# Patient Record
Sex: Female | Born: 1950 | Race: White | Hispanic: No | Marital: Married | Smoking: Never smoker
Health system: Southern US, Community
[De-identification: ages and names within clinical notes are randomized; demographics above are authoritative.]

## PROBLEM LIST (undated history)

## (undated) DIAGNOSIS — F329 Major depressive disorder, single episode, unspecified: Secondary | ICD-10-CM

## (undated) DIAGNOSIS — I4891 Unspecified atrial fibrillation: Secondary | ICD-10-CM

## (undated) DIAGNOSIS — R319 Hematuria, unspecified: Secondary | ICD-10-CM

## (undated) DIAGNOSIS — I5032 Chronic diastolic (congestive) heart failure: Secondary | ICD-10-CM

## (undated) DIAGNOSIS — Z953 Presence of xenogenic heart valve: Secondary | ICD-10-CM

## (undated) DIAGNOSIS — J189 Pneumonia, unspecified organism: Secondary | ICD-10-CM

## (undated) DIAGNOSIS — I050 Rheumatic mitral stenosis: Secondary | ICD-10-CM

## (undated) DIAGNOSIS — T4145XA Adverse effect of unspecified anesthetic, initial encounter: Secondary | ICD-10-CM

## (undated) DIAGNOSIS — R112 Nausea with vomiting, unspecified: Secondary | ICD-10-CM

## (undated) DIAGNOSIS — I4811 Longstanding persistent atrial fibrillation: Secondary | ICD-10-CM

## (undated) DIAGNOSIS — I499 Cardiac arrhythmia, unspecified: Secondary | ICD-10-CM

## (undated) DIAGNOSIS — E041 Nontoxic single thyroid nodule: Secondary | ICD-10-CM

## (undated) DIAGNOSIS — Z923 Personal history of irradiation: Secondary | ICD-10-CM

## (undated) DIAGNOSIS — E039 Hypothyroidism, unspecified: Secondary | ICD-10-CM

## (undated) DIAGNOSIS — I080 Rheumatic disorders of both mitral and aortic valves: Secondary | ICD-10-CM

## (undated) DIAGNOSIS — T8859XA Other complications of anesthesia, initial encounter: Secondary | ICD-10-CM

## (undated) DIAGNOSIS — Z8679 Personal history of other diseases of the circulatory system: Secondary | ICD-10-CM

## (undated) DIAGNOSIS — Z9889 Other specified postprocedural states: Secondary | ICD-10-CM

## (undated) DIAGNOSIS — I099 Rheumatic heart disease, unspecified: Secondary | ICD-10-CM

## (undated) DIAGNOSIS — F32A Depression, unspecified: Secondary | ICD-10-CM

## (undated) DIAGNOSIS — S62109A Fracture of unspecified carpal bone, unspecified wrist, initial encounter for closed fracture: Secondary | ICD-10-CM

## (undated) HISTORY — DX: Nontoxic single thyroid nodule: E04.1

## (undated) HISTORY — PX: FRACTURE SURGERY: SHX138

## (undated) HISTORY — DX: Rheumatic heart disease, unspecified: I09.9

## (undated) HISTORY — PX: TUBAL LIGATION: SHX77

## (undated) HISTORY — PX: CARDIOVERSION: SHX1299

## (undated) HISTORY — DX: Rheumatic mitral stenosis: I05.0

## (undated) HISTORY — PX: COLONOSCOPY: SHX174

## (undated) HISTORY — DX: Hematuria, unspecified: R31.9

## (undated) HISTORY — PX: CATARACT EXTRACTION: SUR2

## (undated) HISTORY — DX: Rheumatic disorders of both mitral and aortic valves: I08.0

## (undated) HISTORY — DX: Unspecified atrial fibrillation: I48.91

---
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
## 1987-05-08 HISTORY — PX: OTHER SURGICAL HISTORY: SHX169

## 1998-10-06 HISTORY — PX: PERCUTANEOUS BALLOON VALVULOPLASTY: SHX270

## 1998-10-07 ENCOUNTER — Ambulatory Visit (HOSPITAL_COMMUNITY): Admission: RE | Admit: 1998-10-07 | Discharge: 1998-10-07 | Payer: Self-pay

## 1998-10-17 ENCOUNTER — Inpatient Hospital Stay (HOSPITAL_COMMUNITY): Admission: AD | Admit: 1998-10-17 | Discharge: 1998-10-20 | Payer: Self-pay | Admitting: Cardiology

## 1998-10-20 ENCOUNTER — Encounter: Payer: Self-pay | Admitting: Cardiovascular Disease

## 1998-10-25 ENCOUNTER — Inpatient Hospital Stay (HOSPITAL_COMMUNITY): Admission: EM | Admit: 1998-10-25 | Discharge: 1998-10-27 | Payer: Self-pay | Admitting: Emergency Medicine

## 1999-03-23 ENCOUNTER — Ambulatory Visit (HOSPITAL_COMMUNITY): Admission: RE | Admit: 1999-03-23 | Discharge: 1999-03-23 | Payer: Self-pay | Admitting: Obstetrics and Gynecology

## 1999-03-23 ENCOUNTER — Encounter: Payer: Self-pay | Admitting: Obstetrics and Gynecology

## 1999-03-27 ENCOUNTER — Other Ambulatory Visit: Admission: RE | Admit: 1999-03-27 | Discharge: 1999-03-27 | Payer: Self-pay | Admitting: Obstetrics and Gynecology

## 2000-07-17 ENCOUNTER — Other Ambulatory Visit: Admission: RE | Admit: 2000-07-17 | Discharge: 2000-07-17 | Payer: Self-pay | Admitting: Obstetrics and Gynecology

## 2000-07-24 ENCOUNTER — Encounter: Payer: Self-pay | Admitting: Obstetrics and Gynecology

## 2000-07-24 ENCOUNTER — Ambulatory Visit (HOSPITAL_COMMUNITY): Admission: RE | Admit: 2000-07-24 | Discharge: 2000-07-24 | Payer: Self-pay | Admitting: Obstetrics and Gynecology

## 2000-07-26 ENCOUNTER — Encounter: Payer: Self-pay | Admitting: Obstetrics and Gynecology

## 2000-07-26 ENCOUNTER — Encounter: Admission: RE | Admit: 2000-07-26 | Discharge: 2000-07-26 | Payer: Self-pay | Admitting: Obstetrics and Gynecology

## 2002-01-12 ENCOUNTER — Ambulatory Visit (HOSPITAL_COMMUNITY): Admission: RE | Admit: 2002-01-12 | Discharge: 2002-01-12 | Payer: Self-pay | Admitting: Obstetrics and Gynecology

## 2002-01-12 ENCOUNTER — Encounter: Payer: Self-pay | Admitting: Obstetrics and Gynecology

## 2002-02-03 ENCOUNTER — Other Ambulatory Visit: Admission: RE | Admit: 2002-02-03 | Discharge: 2002-02-03 | Payer: Self-pay | Admitting: Obstetrics and Gynecology

## 2003-01-14 ENCOUNTER — Encounter: Payer: Self-pay | Admitting: Obstetrics and Gynecology

## 2003-01-14 ENCOUNTER — Ambulatory Visit (HOSPITAL_COMMUNITY): Admission: RE | Admit: 2003-01-14 | Discharge: 2003-01-14 | Payer: Self-pay | Admitting: Obstetrics and Gynecology

## 2003-03-04 ENCOUNTER — Other Ambulatory Visit: Admission: RE | Admit: 2003-03-04 | Discharge: 2003-03-04 | Payer: Self-pay | Admitting: Obstetrics and Gynecology

## 2004-03-14 ENCOUNTER — Ambulatory Visit: Payer: Self-pay | Admitting: Cardiology

## 2004-04-13 ENCOUNTER — Ambulatory Visit: Payer: Self-pay | Admitting: Cardiology

## 2004-05-17 ENCOUNTER — Ambulatory Visit: Payer: Self-pay

## 2004-06-07 ENCOUNTER — Ambulatory Visit: Payer: Self-pay | Admitting: Cardiology

## 2004-07-05 ENCOUNTER — Ambulatory Visit: Payer: Self-pay | Admitting: Cardiology

## 2004-07-28 ENCOUNTER — Other Ambulatory Visit: Admission: RE | Admit: 2004-07-28 | Discharge: 2004-07-28 | Payer: Self-pay | Admitting: Obstetrics and Gynecology

## 2004-08-02 ENCOUNTER — Ambulatory Visit: Payer: Self-pay | Admitting: *Deleted

## 2004-08-15 ENCOUNTER — Encounter: Admission: RE | Admit: 2004-08-15 | Discharge: 2004-08-15 | Payer: Self-pay | Admitting: Obstetrics and Gynecology

## 2004-08-30 ENCOUNTER — Ambulatory Visit: Payer: Self-pay | Admitting: Cardiology

## 2004-09-13 ENCOUNTER — Ambulatory Visit: Payer: Self-pay | Admitting: Cardiology

## 2004-10-11 ENCOUNTER — Ambulatory Visit: Payer: Self-pay | Admitting: Cardiology

## 2004-11-08 ENCOUNTER — Ambulatory Visit: Payer: Self-pay | Admitting: Internal Medicine

## 2004-11-28 ENCOUNTER — Ambulatory Visit: Payer: Self-pay | Admitting: Cardiology

## 2004-12-26 ENCOUNTER — Ambulatory Visit: Payer: Self-pay | Admitting: Cardiology

## 2005-01-10 ENCOUNTER — Ambulatory Visit: Payer: Self-pay | Admitting: Cardiology

## 2005-01-25 ENCOUNTER — Ambulatory Visit: Payer: Self-pay | Admitting: Cardiology

## 2005-02-08 ENCOUNTER — Ambulatory Visit: Payer: Self-pay

## 2005-02-20 ENCOUNTER — Ambulatory Visit: Payer: Self-pay | Admitting: Cardiology

## 2005-03-27 ENCOUNTER — Ambulatory Visit: Payer: Self-pay | Admitting: Cardiology

## 2005-05-08 ENCOUNTER — Ambulatory Visit: Payer: Self-pay | Admitting: *Deleted

## 2005-06-07 ENCOUNTER — Ambulatory Visit: Payer: Self-pay | Admitting: Cardiology

## 2005-07-05 ENCOUNTER — Ambulatory Visit: Payer: Self-pay | Admitting: Cardiology

## 2005-08-07 ENCOUNTER — Ambulatory Visit: Payer: Self-pay | Admitting: Cardiology

## 2005-09-04 ENCOUNTER — Ambulatory Visit: Payer: Self-pay | Admitting: Cardiology

## 2005-09-18 ENCOUNTER — Encounter: Admission: RE | Admit: 2005-09-18 | Discharge: 2005-09-18 | Payer: Self-pay | Admitting: Obstetrics and Gynecology

## 2005-09-19 ENCOUNTER — Other Ambulatory Visit: Admission: RE | Admit: 2005-09-19 | Discharge: 2005-09-19 | Payer: Self-pay | Admitting: Obstetrics & Gynecology

## 2005-10-02 ENCOUNTER — Ambulatory Visit: Payer: Self-pay | Admitting: Internal Medicine

## 2005-10-31 ENCOUNTER — Ambulatory Visit: Payer: Self-pay | Admitting: Cardiology

## 2005-11-28 ENCOUNTER — Ambulatory Visit: Payer: Self-pay | Admitting: Cardiology

## 2005-12-26 ENCOUNTER — Ambulatory Visit: Payer: Self-pay | Admitting: Cardiology

## 2006-01-28 ENCOUNTER — Ambulatory Visit: Payer: Self-pay | Admitting: Cardiology

## 2006-02-25 ENCOUNTER — Ambulatory Visit: Payer: Self-pay | Admitting: Cardiovascular Disease

## 2006-04-02 ENCOUNTER — Ambulatory Visit: Payer: Self-pay | Admitting: Cardiology

## 2006-05-08 ENCOUNTER — Ambulatory Visit: Payer: Self-pay | Admitting: *Deleted

## 2006-05-16 ENCOUNTER — Ambulatory Visit: Payer: Self-pay | Admitting: Cardiology

## 2006-05-16 LAB — CONVERTED CEMR LAB
MCHC: 33.8 g/dL (ref 30.0–36.0)
MCV: 90.6 fL (ref 78.0–100.0)
Platelets: 229 10*3/uL (ref 150–400)
RBC: 4.44 M/uL (ref 3.87–5.11)
RDW: 13.1 % (ref 11.5–14.6)

## 2006-05-28 ENCOUNTER — Ambulatory Visit: Payer: Self-pay

## 2006-05-28 ENCOUNTER — Ambulatory Visit: Payer: Self-pay | Admitting: *Deleted

## 2006-05-28 ENCOUNTER — Encounter: Payer: Self-pay | Admitting: Cardiology

## 2006-06-11 ENCOUNTER — Ambulatory Visit: Payer: Self-pay | Admitting: Cardiology

## 2006-07-09 ENCOUNTER — Ambulatory Visit: Payer: Self-pay | Admitting: Cardiology

## 2006-08-06 ENCOUNTER — Ambulatory Visit: Payer: Self-pay | Admitting: *Deleted

## 2006-08-20 ENCOUNTER — Ambulatory Visit: Payer: Self-pay | Admitting: *Deleted

## 2006-09-17 ENCOUNTER — Ambulatory Visit: Payer: Self-pay | Admitting: Cardiology

## 2006-10-15 ENCOUNTER — Ambulatory Visit: Payer: Self-pay | Admitting: Cardiology

## 2006-11-12 ENCOUNTER — Ambulatory Visit: Payer: Self-pay | Admitting: Cardiology

## 2006-11-22 ENCOUNTER — Encounter: Admission: RE | Admit: 2006-11-22 | Discharge: 2006-11-22 | Payer: Self-pay | Admitting: Obstetrics and Gynecology

## 2006-11-29 ENCOUNTER — Other Ambulatory Visit: Admission: RE | Admit: 2006-11-29 | Discharge: 2006-11-29 | Payer: Self-pay | Admitting: Obstetrics and Gynecology

## 2006-12-03 ENCOUNTER — Ambulatory Visit: Payer: Self-pay | Admitting: Cardiology

## 2006-12-31 ENCOUNTER — Ambulatory Visit: Payer: Self-pay | Admitting: Cardiology

## 2007-01-28 ENCOUNTER — Ambulatory Visit: Payer: Self-pay | Admitting: Cardiology

## 2007-02-25 ENCOUNTER — Ambulatory Visit: Payer: Self-pay | Admitting: Cardiovascular Disease

## 2007-03-25 ENCOUNTER — Ambulatory Visit: Payer: Self-pay | Admitting: Cardiology

## 2007-04-23 ENCOUNTER — Ambulatory Visit: Payer: Self-pay | Admitting: Cardiovascular Disease

## 2007-05-14 ENCOUNTER — Ambulatory Visit: Payer: Self-pay | Admitting: Cardiovascular Disease

## 2007-06-11 ENCOUNTER — Ambulatory Visit: Payer: Self-pay | Admitting: Internal Medicine

## 2007-06-26 ENCOUNTER — Ambulatory Visit: Payer: Self-pay | Admitting: Cardiology

## 2007-06-26 LAB — CONVERTED CEMR LAB
Basophils Relative: 0.9 % (ref 0.0–1.0)
Monocytes Relative: 10.1 % (ref 3.0–11.0)
Neutro Abs: 5.4 10*3/uL (ref 1.4–7.7)
Platelets: 242 10*3/uL (ref 150–400)
RBC: 4.39 M/uL (ref 3.87–5.11)
RDW: 13 % (ref 11.5–14.6)
WBC: 8.4 10*3/uL (ref 4.5–10.5)

## 2007-07-15 ENCOUNTER — Ambulatory Visit: Payer: Self-pay | Admitting: Cardiology

## 2007-07-15 ENCOUNTER — Ambulatory Visit: Payer: Self-pay

## 2007-07-15 ENCOUNTER — Encounter: Payer: Self-pay | Admitting: Cardiology

## 2007-08-05 ENCOUNTER — Ambulatory Visit: Payer: Self-pay | Admitting: Internal Medicine

## 2007-08-26 ENCOUNTER — Ambulatory Visit: Payer: Self-pay | Admitting: Cardiology

## 2007-09-23 ENCOUNTER — Ambulatory Visit: Payer: Self-pay | Admitting: Cardiology

## 2007-10-21 ENCOUNTER — Ambulatory Visit: Payer: Self-pay | Admitting: Cardiology

## 2007-11-24 ENCOUNTER — Encounter: Admission: RE | Admit: 2007-11-24 | Discharge: 2007-11-24 | Payer: Self-pay | Admitting: Obstetrics and Gynecology

## 2007-11-24 ENCOUNTER — Ambulatory Visit: Payer: Self-pay | Admitting: Cardiology

## 2007-12-02 ENCOUNTER — Encounter: Admission: RE | Admit: 2007-12-02 | Discharge: 2007-12-02 | Payer: Self-pay | Admitting: Obstetrics and Gynecology

## 2007-12-11 ENCOUNTER — Ambulatory Visit: Payer: Self-pay | Admitting: Internal Medicine

## 2007-12-16 ENCOUNTER — Other Ambulatory Visit: Admission: RE | Admit: 2007-12-16 | Discharge: 2007-12-16 | Payer: Self-pay | Admitting: Obstetrics and Gynecology

## 2008-01-08 ENCOUNTER — Ambulatory Visit: Payer: Self-pay | Admitting: Cardiology

## 2008-01-20 ENCOUNTER — Ambulatory Visit: Payer: Self-pay | Admitting: Cardiology

## 2008-02-10 ENCOUNTER — Ambulatory Visit: Payer: Self-pay | Admitting: Cardiovascular Disease

## 2008-03-09 ENCOUNTER — Ambulatory Visit: Payer: Self-pay | Admitting: Internal Medicine

## 2008-03-30 ENCOUNTER — Ambulatory Visit: Payer: Self-pay | Admitting: Cardiology

## 2008-04-20 ENCOUNTER — Ambulatory Visit: Payer: Self-pay | Admitting: Internal Medicine

## 2008-05-18 ENCOUNTER — Ambulatory Visit: Payer: Self-pay | Admitting: Cardiovascular Disease

## 2008-06-22 ENCOUNTER — Ambulatory Visit: Payer: Self-pay | Admitting: Cardiology

## 2008-07-02 ENCOUNTER — Ambulatory Visit: Payer: Self-pay | Admitting: Cardiology

## 2008-07-09 ENCOUNTER — Ambulatory Visit: Payer: Self-pay

## 2008-07-09 ENCOUNTER — Encounter: Payer: Self-pay | Admitting: Cardiology

## 2008-07-09 ENCOUNTER — Ambulatory Visit: Payer: Self-pay | Admitting: Cardiology

## 2008-07-09 LAB — CONVERTED CEMR LAB
Basophils Relative: 0.3 % (ref 0.0–3.0)
Eosinophils Absolute: 0.1 10*3/uL (ref 0.0–0.7)
Eosinophils Relative: 1.4 % (ref 0.0–5.0)
HCT: 40.9 % (ref 36.0–46.0)
Hemoglobin: 13.8 g/dL (ref 12.0–15.0)
MCV: 91.9 fL (ref 78.0–100.0)
Neutro Abs: 4.2 10*3/uL (ref 1.4–7.7)
Neutrophils Relative %: 66.3 % (ref 43.0–77.0)
RBC: 4.45 M/uL (ref 3.87–5.11)
WBC: 6.4 10*3/uL (ref 4.5–10.5)

## 2008-07-14 ENCOUNTER — Ambulatory Visit: Payer: Self-pay

## 2008-07-20 ENCOUNTER — Ambulatory Visit: Payer: Self-pay | Admitting: Internal Medicine

## 2008-08-17 ENCOUNTER — Ambulatory Visit: Payer: Self-pay | Admitting: Cardiology

## 2008-08-26 DIAGNOSIS — I099 Rheumatic heart disease, unspecified: Secondary | ICD-10-CM | POA: Insufficient documentation

## 2008-08-26 DIAGNOSIS — I080 Rheumatic disorders of both mitral and aortic valves: Secondary | ICD-10-CM | POA: Insufficient documentation

## 2008-08-26 DIAGNOSIS — I050 Rheumatic mitral stenosis: Secondary | ICD-10-CM

## 2008-08-26 DIAGNOSIS — I4891 Unspecified atrial fibrillation: Secondary | ICD-10-CM

## 2008-08-26 DIAGNOSIS — I4811 Longstanding persistent atrial fibrillation: Secondary | ICD-10-CM | POA: Insufficient documentation

## 2008-08-26 HISTORY — DX: Rheumatic mitral stenosis: I05.0

## 2008-08-26 HISTORY — DX: Rheumatic disorders of both mitral and aortic valves: I08.0

## 2008-08-26 HISTORY — DX: Unspecified atrial fibrillation: I48.91

## 2008-08-26 HISTORY — DX: Rheumatic heart disease, unspecified: I09.9

## 2008-08-30 ENCOUNTER — Encounter (INDEPENDENT_AMBULATORY_CARE_PROVIDER_SITE_OTHER): Payer: Self-pay | Admitting: *Deleted

## 2008-08-30 ENCOUNTER — Ambulatory Visit: Payer: Self-pay | Admitting: Cardiology

## 2008-09-03 ENCOUNTER — Telehealth: Payer: Self-pay | Admitting: Cardiology

## 2008-09-14 ENCOUNTER — Ambulatory Visit: Payer: Self-pay | Admitting: Cardiology

## 2008-09-29 ENCOUNTER — Encounter: Payer: Self-pay | Admitting: Cardiology

## 2008-10-05 ENCOUNTER — Encounter: Payer: Self-pay | Admitting: *Deleted

## 2008-10-19 ENCOUNTER — Ambulatory Visit: Payer: Self-pay | Admitting: Internal Medicine

## 2008-10-19 ENCOUNTER — Encounter (INDEPENDENT_AMBULATORY_CARE_PROVIDER_SITE_OTHER): Payer: Self-pay | Admitting: Pharmacist

## 2008-10-19 LAB — CONVERTED CEMR LAB: Protime: 17.6

## 2008-11-10 ENCOUNTER — Encounter: Payer: Self-pay | Admitting: *Deleted

## 2008-11-16 ENCOUNTER — Ambulatory Visit: Payer: Self-pay | Admitting: Cardiology

## 2008-11-16 ENCOUNTER — Encounter (INDEPENDENT_AMBULATORY_CARE_PROVIDER_SITE_OTHER): Payer: Self-pay | Admitting: Cardiology

## 2008-12-09 ENCOUNTER — Encounter: Admission: RE | Admit: 2008-12-09 | Discharge: 2008-12-09 | Payer: Self-pay | Admitting: Obstetrics and Gynecology

## 2008-12-14 ENCOUNTER — Encounter: Admission: RE | Admit: 2008-12-14 | Discharge: 2008-12-14 | Payer: Self-pay | Admitting: Obstetrics and Gynecology

## 2008-12-14 ENCOUNTER — Encounter: Payer: Self-pay | Admitting: Cardiology

## 2008-12-15 ENCOUNTER — Ambulatory Visit: Payer: Self-pay | Admitting: Internal Medicine

## 2008-12-15 LAB — CONVERTED CEMR LAB: POC INR: 1.9

## 2009-01-07 ENCOUNTER — Telehealth: Payer: Self-pay | Admitting: Cardiology

## 2009-01-12 ENCOUNTER — Ambulatory Visit: Payer: Self-pay | Admitting: Cardiology

## 2009-01-12 LAB — CONVERTED CEMR LAB: POC INR: 2.6

## 2009-02-15 ENCOUNTER — Ambulatory Visit: Payer: Self-pay | Admitting: Cardiology

## 2009-02-15 DIAGNOSIS — R0602 Shortness of breath: Secondary | ICD-10-CM | POA: Insufficient documentation

## 2009-02-15 LAB — CONVERTED CEMR LAB: POC INR: 2.8

## 2009-02-16 ENCOUNTER — Encounter (INDEPENDENT_AMBULATORY_CARE_PROVIDER_SITE_OTHER): Payer: Self-pay | Admitting: *Deleted

## 2009-02-16 LAB — CONVERTED CEMR LAB
BUN: 13 mg/dL (ref 6–23)
Cholesterol: 178 mg/dL (ref 0–200)
Creatinine, Ser: 1.1 mg/dL (ref 0.4–1.2)
GFR calc non Af Amer: 53.74 mL/min (ref >60)
Potassium: 3.6 meq/L (ref 3.5–5.1)
VLDL: 14.2 mg/dL (ref 0.0–40.0)

## 2009-03-15 ENCOUNTER — Ambulatory Visit: Payer: Self-pay | Admitting: Cardiology

## 2009-04-15 ENCOUNTER — Ambulatory Visit: Payer: Self-pay | Admitting: Cardiology

## 2009-04-15 LAB — CONVERTED CEMR LAB: POC INR: 2.5

## 2009-05-03 ENCOUNTER — Encounter: Payer: Self-pay | Admitting: Cardiology

## 2009-05-03 ENCOUNTER — Telehealth: Payer: Self-pay | Admitting: Cardiology

## 2009-05-13 ENCOUNTER — Ambulatory Visit: Payer: Self-pay | Admitting: Cardiology

## 2009-05-13 LAB — CONVERTED CEMR LAB: POC INR: 1.7

## 2009-05-31 ENCOUNTER — Telehealth (INDEPENDENT_AMBULATORY_CARE_PROVIDER_SITE_OTHER): Payer: Self-pay | Admitting: Cardiology

## 2009-06-14 ENCOUNTER — Ambulatory Visit: Payer: Self-pay | Admitting: Cardiology

## 2009-07-12 ENCOUNTER — Ambulatory Visit: Payer: Self-pay | Admitting: Cardiology

## 2009-07-12 LAB — CONVERTED CEMR LAB: POC INR: 2

## 2009-09-22 ENCOUNTER — Encounter: Payer: Self-pay | Admitting: Internal Medicine

## 2009-12-02 ENCOUNTER — Ambulatory Visit: Payer: Self-pay | Admitting: Cardiology

## 2009-12-15 ENCOUNTER — Ambulatory Visit (HOSPITAL_COMMUNITY): Admission: RE | Admit: 2009-12-15 | Discharge: 2009-12-15 | Payer: Self-pay | Admitting: Cardiology

## 2009-12-15 ENCOUNTER — Encounter: Payer: Self-pay | Admitting: Cardiology

## 2009-12-15 ENCOUNTER — Ambulatory Visit: Payer: Self-pay

## 2009-12-15 ENCOUNTER — Ambulatory Visit: Payer: Self-pay | Admitting: Internal Medicine

## 2010-01-30 ENCOUNTER — Encounter: Admission: RE | Admit: 2010-01-30 | Discharge: 2010-01-30 | Payer: Self-pay | Admitting: Obstetrics and Gynecology

## 2010-04-11 ENCOUNTER — Telehealth: Payer: Self-pay | Admitting: Cardiology

## 2010-04-13 ENCOUNTER — Ambulatory Visit: Payer: Self-pay | Admitting: Cardiology

## 2010-04-14 LAB — CONVERTED CEMR LAB
Calcium: 9.5 mg/dL (ref 8.4–10.5)
GFR calc non Af Amer: 71.12 mL/min (ref >60.00)
Sodium: 138 meq/L (ref 135–145)

## 2010-05-02 ENCOUNTER — Telehealth: Payer: Self-pay | Admitting: Cardiology

## 2010-05-10 ENCOUNTER — Ambulatory Visit: Admission: RE | Admit: 2010-05-10 | Payer: Self-pay | Source: Home / Self Care

## 2010-05-11 ENCOUNTER — Ambulatory Visit: Payer: Self-pay | Admitting: Cardiology

## 2010-05-11 ENCOUNTER — Ambulatory Visit
Admission: RE | Admit: 2010-05-11 | Discharge: 2010-05-11 | Payer: Self-pay | Source: Home / Self Care | Attending: Cardiology | Admitting: Cardiology

## 2010-05-11 LAB — CONVERTED CEMR LAB: POC INR: 1.8

## 2010-05-24 ENCOUNTER — Encounter: Payer: Self-pay | Admitting: Internal Medicine

## 2010-05-24 ENCOUNTER — Ambulatory Visit: Admission: RE | Admit: 2010-05-24 | Discharge: 2010-05-24 | Payer: Self-pay | Source: Home / Self Care

## 2010-05-24 LAB — CONVERTED CEMR LAB: POC INR: 2.3

## 2010-05-29 ENCOUNTER — Encounter: Payer: Self-pay | Admitting: Obstetrics and Gynecology

## 2010-06-06 NOTE — Medication Information (Signed)
Summary: Coumadin Clinic  Anticoagulant Therapy  Managed by: Inactive Referring MD: Charlton Haws MD PCP: [facility] Supervising MD: Ladona Ridgel MD, Sharlot Gowda Indication 1: Atrial Fibrillation (ICD-427.31) Lab Used: [facility] Site: [facility] INR RANGE 2 - 3          Comments: Pt states she now following at [facility].   Allergies: No Known Drug Allergies  Anticoagulation Management History:      Negative risk factors for bleeding include an age less than 22 years old.  The bleeding index is 'low risk'.  Negative CHADS2 values include Age > 27 years old.  The start date was 10/06/1998.  Anticoagulation responsible provider: Ladona Ridgel MD, Sharlot Gowda.  Exp: 09/2010.    Anticoagulation Management Assessment/Plan:      The patient's current anticoagulation dose is Coumadin 2.5 mg tabs: Take 1 tablet by mouth once a day.  The target INR is 2 - 3.  The next INR is due 08/09/2009.  Anticoagulation instructions were given to patient.  Results were reviewed/authorized by Inactive.         Prior Anticoagulation Instructions: INR 2.0  Take 1 tablet today then resume same dosage 1 tablet daily except 1/2 tablet on Tuesdays, Thursdays, and Saturdays.  Recheck in 4 weeks.

## 2010-06-06 NOTE — Letter (Signed)
Summary: Home INR Monitoring Physician Form  Home INR Monitoring Physician Form   Imported By: Marylou Mccoy 05/18/2009 18:05:56  _____________________________________________________________________  External Attachment:    Type:   Image     Comment:   External Document

## 2010-06-06 NOTE — Progress Notes (Signed)
Summary: re med change  Phone Note Call from Patient   Caller: Patient [phone] Reason for Call: Talk to Nurse Summary of Call: pt on coumadin, wants to know if she can change to pradaxa Initial call taken by: Glynda Jaeger,  April 11, 2010 10:40 AM  Follow-up for Phone Call        pt wants to know if she would be able to take  pradaxa. will foward for dr Jens Som review Deliah Goody, RN  April 11, 2010 4:30 PM   Additional Follow-up for Phone Call Additional follow up Details #1::        would need to check renal function before switching Ferman Hamming, MD, [facility]  April 12, 2010 6:09 AM  pt will have labs drawn tomorrow Deliah Goody, RN  April 12, 2010 12:09 PM

## 2010-06-06 NOTE — Assessment & Plan Note (Signed)
Summary: ROV   Primary Provider:  [facility]  CC:  check up.  History of Present Illness: Ms. Lorraine Kemp is a very pleasant 60 year old female, who has a history of permanent atrial fibrillation and rheumatic heart disease.  She has had prior balloon valvuloplasty at [facility] in June 2000.  Last echo was performed in  March of 2010 and showed normal LV function, moderate mitral stenosis with a mean gradient of 6-8 mmHg and a valve area of 1.38 cm, and mildly elevated pulmonary pressures. Her mitral regurgitation was in the mild range.. We referred her to [facility]. She had a stress echo and was felt to have mild to moderate MS and medical therapy was recommended. Repeat valvuloplasty in the future was felt to be problematic as it may increase MR. I last saw her in Oct of 2010. Since she was last seen she has DOE relieved with rest; no associated chest pain; no orthopnea, pnd, pedal edema, exertional chest pain, palpitations, syncope or bleeding.  Current Medications (verified): 1)  Fish Oil   Oil (Fish Oil) .Marland Kitchen.. 1 Tab By Mouth Once Daily 2)  Multivitamins   Tabs (Multiple Vitamin) .Marland Kitchen.. 1 Tab By Mouth Once Daily 3)  Vitamin D (Ergocalciferol) 50000 Unit Caps (Ergocalciferol) .... Weekly 4)  Budeprion Xl 300 Mg Xr24h-Tab (Bupropion Hcl) .Marland Kitchen.. 1 By Mouth Once Daily 5)  Calcium 600 Mg Tabs (Calcium) .Marland Kitchen.. 1 Tab By Mouth Once Daily 6)  Coumadin 2.5 Mg Tabs (Warfarin Sodium) .... Take 1 Tablet By Mouth Once A Day 7)  Diltiazem Hcl Coated Beads 240 Mg Xr24h-Cap (Diltiazem Hcl Coated Beads) .... Take 1 Capsule By Mouth Once A Day 8)  Prempro 0.3-1.5 Mg Tabs (Conj Estrog-Medroxyprogest Ace) .Marland Kitchen.. 1 Tab By Mouth Once Daily  Allergies: No Known Drug Allergies  Past History:  Past Medical History: RHEUMATIC HEART DISEASE (ICD-398.90) MITRAL REGURGITATION (ICD-396.3) MITRAL STENOSIS (ICD-394.0) ATRIAL FIBRILLATION (ICD-427.31)  Past Surgical History: Reviewed history from 08/26/2008  and no changes required. prior balloon valvuloplasty at [facility] in June 2000.  c section tonsilectomy screws in ankles  Social History: Reviewed history from 02/15/2009 and no changes required. Married  Drug Use - no Tobacco Use - No.   Review of Systems       no fevers or chills, productive cough, hemoptysis, dysphasia, odynophagia, melena, hematochezia, dysuria, hematuria, rash, seizure activity, orthopnea, PND, pedal edema, claudication. Remaining systems are negative.   Vital Signs:  Patient profile:   60 year old female Height:      65 inches Weight:      161 pounds BMI:     26.89 Pulse rate:   90 / minute Resp:     12 per minute BP sitting:   134 / 55  (left arm)  Vitals Entered By: Kem Parkinson (December 02, 2009 10:21 AM)  Physical Exam  General:  Well-developed well-nourished in no acute distress.  Skin is warm and dry.  HEENT is normal.  Neck is supple. No thyromegaly.  Chest is clear to auscultation with normal expansion.  Cardiovascular exam is irregular Abdominal exam nontender or distended. No masses palpated. Extremities show no edema. neuro grossly intact    EKG  Procedure date:  12/02/2009  Findings:      Atrial fibrillation at a rate of 90. Axis normal. Nonspecific ST changes. Cannot rule out prior septal infarct.  Impression & Recommendations:  Problem # 1:  COUMADIN THERAPY (ICD-V58.61) Monitored by primary care.  Problem # 2:  MITRAL STENOSIS (ICD-394.0)  Plan repeat echocardiogram. Patient understands that she will most likely require mitral valve replacement in the future. She would not be a good candidate for repeat valvuloplasty because of her mitral regurgitation. Continued SBE prophylaxis. Orders: Echocardiogram (Echo)  Problem # 3:  ATRIAL FIBRILLATION (ICD-427.31) Continue Cardizem for rate control as well as Coumadin. Her updated medication list for this problem includes:    Coumadin 2.5 Mg Tabs (Warfarin sodium)  .Marland Kitchen... Take 1 tablet by mouth once a day  Patient Instructions: 1)  Your physician recommends that you schedule a follow-up appointment in: ONE YEAR 2)  Your physician has requested that you have an echocardiogram.  Echocardiography is a painless test that uses sound waves to create images of your heart. It provides your doctor with information about the size and shape of your heart and how well your heart's chambers and valves are working.  This procedure takes approximately one hour. There are no restrictions for this procedure. Prescriptions: DILTIAZEM HCL COATED BEADS 240 MG XR24H-CAP (DILTIAZEM HCL COATED BEADS) Take 1 capsule by mouth once a day  #90 x 3   Entered by:   Kem Parkinson   Authorized by:   Ferman Hamming, MD, [facility]   Signed by:   Kem Parkinson on 12/02/2009   Method used:   Faxed to ...       [facility] (mail-order)             , [place]         Ph:        Fax: [phone]   RxID:   [id number]

## 2010-06-06 NOTE — Medication Information (Signed)
Summary: rov/ewj  Anticoagulant Therapy  Managed by: Weston Brass, PharmD Referring MD: Charlton Haws MD PCP: [facility] Supervising MD: Antoine Poche MD, Fayrene Fearing Indication 1: Atrial Fibrillation (ICD-427.31) Lab Used: [facility] Site: [facility] INR POC 1.7 INR RANGE 2 - 3  Dietary changes: no    Health status changes: no    Bleeding/hemorrhagic complications: no    Recent/future hospitalizations: no    Any changes in medication regimen? no    Recent/future dental: no  Any missed doses?: no       Is patient compliant with meds? yes       Allergies: No Known Drug Allergies  Anticoagulation Management History:      The patient is taking warfarin and comes in today for a routine follow up visit.  Negative risk factors for bleeding include an age less than 63 years old.  The bleeding index is 'low risk'.  Negative CHADS2 values include Age > 59 years old.  The start date was 10/06/1998.  Anticoagulation responsible provider: Antoine Poche MD, Fayrene Fearing.  INR POC: 1.7.  Cuvette Lot#: [id number].  Exp: 06/2010.    Anticoagulation Management Assessment/Plan:      The patient's current anticoagulation dose is Coumadin 2.5 mg tabs: Take 1 tablet by mouth once a day.  The target INR is 2 - 3.  The next INR is due 06/08/2009.  Anticoagulation instructions were given to patient.  Results were reviewed/authorized by Weston Brass, PharmD.  She was notified by Lew Dawes, PharmD Candidate.         Prior Anticoagulation Instructions: INR 2.5  Continue on same dosage 1 tablet daily except 1/2 tablet on Tuesdays, Thursdays, and Saturdays.  Recheck in 4 weeks.    Current Anticoagulation Instructions: INR 1.7  Take 1 tablet tomorrow then resume normal dose of 1 tablet daily except 1/2 tablet Tuesdays, Thursdays, and Saturdays. Recheck in 3 1/2 weeks.

## 2010-06-06 NOTE — Progress Notes (Signed)
Summary: [facility] inactive  Phone Note From Other Clinic   Summary of Call: SPOKE WITH JOEL FROM [facility] MONITORING RE  INR MONITORING MACHINE  FOR PT WISHES TO STOP PROCESS  FOR ORDER WAS UPSET DID NOT GO THROUGH  IN 2010. JOEL WISHES TO SPEAK TO TO SOMEONE FROM CC [phone]. Initial call taken by: Scherrie Bateman, LPN,  May 31, 2009 1:13 PM  Follow-up for Phone Call        Called on 06/03/2009 at 425pm D/W [facility]-- Follow-up by: Shelby Dubin PharmD, BCPS, CPP,  June 03, 2009 4:26 PM

## 2010-06-06 NOTE — Medication Information (Signed)
Summary: rov/tm  Anticoagulant Therapy  Managed by: Cloyde Reams, RN, BSN Referring MD: Charlton Haws MD PCP: [facility] Supervising MD: Myrtis Ser MD, Tinnie Gens Indication 1: Atrial Fibrillation (ICD-427.31) Lab Used: [facility] Site: [facility] INR POC 2.0 INR RANGE 2 - 3  Dietary changes: no    Health status changes: no    Bleeding/hemorrhagic complications: no    Recent/future hospitalizations: no    Any changes in medication regimen? yes       Details: Tylenol prn elbow pain.  Recent/future dental: no  Any missed doses?: no       Is patient compliant with meds? yes       Allergies (verified): No Known Drug Allergies  Anticoagulation Management History:      The patient is taking warfarin and comes in today for a routine follow up visit.  Negative risk factors for bleeding include an age less than 55 years old.  The bleeding index is 'low risk'.  Negative CHADS2 values include Age > 63 years old.  The start date was 10/06/1998.  Anticoagulation responsible provider: Myrtis Ser MD, Tinnie Gens.  INR POC: 2.0.  Cuvette Lot#: [id number].  Exp: 09/2010.    Anticoagulation Management Assessment/Plan:      The patient's current anticoagulation dose is Coumadin 2.5 mg tabs: Take 1 tablet by mouth once a day.  The target INR is 2 - 3.  The next INR is due 08/09/2009.  Anticoagulation instructions were given to patient.  Results were reviewed/authorized by Cloyde Reams, RN, BSN.  She was notified by Cloyde Reams RN.         Prior Anticoagulation Instructions: INR 2.4 Continue 2.5mg s everyday except 1.25mg s on Tuesdays, Thursdays and Saturdays. Recheck in 4 weeks.   Current Anticoagulation Instructions: INR 2.0  Take 1 tablet today then resume same dosage 1 tablet daily except 1/2 tablet on Tuesdays, Thursdays, and Saturdays.  Recheck in 4 weeks.

## 2010-06-06 NOTE — Medication Information (Signed)
Summary: rov/ez  Anticoagulant Therapy  Managed by: Bethena Midget, RN, BSN Referring MD: Charlton Haws MD PCP: [facility] Supervising MD: Riley Kill MD, Maisie Fus Indication 1: Atrial Fibrillation (ICD-427.31) Lab Used: [facility] Site: [facility] INR POC 2.4 INR RANGE 2 - 3  Dietary changes: no    Health status changes: no    Bleeding/hemorrhagic complications: no    Recent/future hospitalizations: no    Any changes in medication regimen? no    Recent/future dental: no  Any missed doses?: no       Is patient compliant with meds? yes       Allergies: No Known Drug Allergies  Anticoagulation Management History:      The patient is taking warfarin and comes in today for a routine follow up visit.  Negative risk factors for bleeding include an age less than 72 years old.  The bleeding index is 'low risk'.  Negative CHADS2 values include Age > 48 years old.  The start date was 10/06/1998.  Anticoagulation responsible provider: Riley Kill MD, Maisie Fus.  INR POC: 2.4.  Cuvette Lot#: [id number].  Exp: 08/2010.    Anticoagulation Management Assessment/Plan:      The patient's current anticoagulation dose is Coumadin 2.5 mg tabs: Take 1 tablet by mouth once a day.  The target INR is 2 - 3.  The next INR is due 07/12/2009.  Anticoagulation instructions were given to patient.  Results were reviewed/authorized by Bethena Midget, RN, BSN.  She was notified by Bethena Midget, RN, BSN.         Prior Anticoagulation Instructions: INR 1.7  Take 1 tablet tomorrow then resume normal dose of 1 tablet daily except 1/2 tablet Tuesdays, Thursdays, and Saturdays. Recheck in 3 1/2 weeks.  Current Anticoagulation Instructions: INR 2.4 Continue 2.5mg s everyday except 1.25mg s on Tuesdays, Thursdays and Saturdays. Recheck in 4 weeks.

## 2010-06-08 NOTE — Progress Notes (Signed)
Summary: question on meds  Phone Note Call from Patient Call back at Home Phone [phone]   Caller: Patient Reason for Call: Talk to Nurse Summary of Call: pt having reaction to medication PRADAXA 150 MG Initial call taken by: Roe Coombs,  May 02, 2010 8:18 AM  Follow-up for Phone Call        spoke with pt, she started the pradaxa on 04/27/10. she is having very bad indigestion type pain, zantac helps the indigestion. she is aware that this is a side effect of pradaxa and wants to switch back to warfarin. she did not take the praxada last night or this morning. will foward for dr Jens Som review. Deliah Goody, RN  May 02, 2010 10:48 AM   Additional Follow-up for Phone Call Additional follow up Details #1::        ok to dc pradaxa; resume coumadin at previous dose; check INR in coumadin clinic one weeks later. Ferman Hamming, MD, [facility]  May 02, 2010 11:02 AM  pt aware, coumadin appt made Deliah Goody, RN  May 02, 2010 1:45 PM

## 2010-06-08 NOTE — Medication Information (Signed)
Summary: rov/tm  Anticoagulant Therapy  Managed by: Bethena Midget, RN, BSN Referring MD: Charlton Haws MD PCP: [facility] Supervising MD: Myrtis Ser MD, Tinnie Gens Indication 1: Atrial Fibrillation (ICD-427.31) Lab Used: [facility] Site: [facility] INR POC 1.8 INR RANGE 2 - 3  Dietary changes: no    Health status changes: no    Bleeding/hemorrhagic complications: no    Recent/future hospitalizations: no    Any changes in medication regimen? no    Recent/future dental: no  Any missed doses?: no       Is patient compliant with meds? yes      Comments: Restarted back on coumadin on 05/02/10 on 2.5mg s daily. This was her previous dose from Dr Yehuda Budd office.   Allergies: No Known Drug Allergies  Anticoagulation Management History:      The patient is taking warfarin and comes in today for a routine follow up visit.  Negative risk factors for bleeding include an age less than 66 years old.  The bleeding index is 'low risk'.  Negative CHADS2 values include Age > 35 years old.  The start date was 10/06/1998.  Anticoagulation responsible provider: Myrtis Ser MD, Tinnie Gens.  INR POC: 1.8.  Cuvette Lot#: [id number].  Exp: 05/2011.    Anticoagulation Management Assessment/Plan:      The target INR is 2 - 3.  The next INR is due 05/24/2010.  Anticoagulation instructions were given to patient.  Results were reviewed/authorized by Bethena Midget, RN, BSN.  She was notified by Cloyde Reams RN.         Prior Anticoagulation Instructions: INR 2.0  Take 1 tablet today then resume same dosage 1 tablet daily except 1/2 tablet on Tuesdays, Thursdays, and Saturdays.  Recheck in 4 weeks.    Current Anticoagulation Instructions: INR 1.8 Today take extra 1/2 pill then continue 1 pill daily. Recheck in 2 weeks.

## 2010-06-08 NOTE — Medication Information (Signed)
Summary: Coumadin Clinic  Anticoagulant Therapy  Managed by: Inactive Referring MD: Charlton Haws MD PCP: [facility] Supervising MD: Graciela Husbands MD, Viviann Spare Indication 1: Atrial Fibrillation (ICD-427.31) Lab Used: [facility] Site: [facility] INR RANGE 2 - 3          Comments: Patient switching to PCP for INR checks   Allergies: No Known Drug Allergies  Anticoagulation Management History:      Negative risk factors for bleeding include an age less than 41 years old.  The bleeding index is 'low risk'.  Negative CHADS2 values include Age > 62 years old.  The start date was 10/06/1998.  Anticoagulation responsible provider: Graciela Husbands MD, Viviann Spare.  Exp: 05/2011.    Anticoagulation Management Assessment/Plan:      The target INR is 2 - 3.  The next INR is due 05/24/2010.  Anticoagulation instructions were given to patient.  Results were reviewed/authorized by Inactive.         Prior Anticoagulation Instructions: INR 2.3  Coumadin 2.5 mg tablets - Continue 1 tablet per day

## 2010-06-08 NOTE — Medication Information (Signed)
Summary: rov/tm  Anticoagulant Therapy  Managed by: Weston Brass, PharmD Referring MD: Charlton Haws MD PCP: [facility] Supervising MD: Graciela Husbands MD, Viviann Spare Indication 1: Atrial Fibrillation (ICD-427.31) Lab Used: [facility] Site: [facility] INR POC 2.3 INR RANGE 2 - 3  Dietary changes: no    Health status changes: no    Bleeding/hemorrhagic complications: no    Recent/future hospitalizations: no    Any changes in medication regimen? no    Recent/future dental: no  Any missed doses?: no       Is patient compliant with meds? yes       Allergies: No Known Drug Allergies  Anticoagulation Management History:      The patient is taking warfarin and comes in today for a routine follow up visit.  Negative risk factors for bleeding include an age less than 34 years old.  The bleeding index is 'low risk'.  Negative CHADS2 values include Age > 50 years old.  The start date was 10/06/1998.  Anticoagulation responsible provider: Graciela Husbands MD, Viviann Spare.  INR POC: 2.3.  Cuvette Lot#: [id number].  Exp: 05/2011.    Anticoagulation Management Assessment/Plan:      The target INR is 2 - 3.  The next INR is due 05/24/2010.  Anticoagulation instructions were given to patient.  Results were reviewed/authorized by Weston Brass, PharmD.  She was notified by Stephannie Peters, PharmD Candidate .         Prior Anticoagulation Instructions: INR 1.8 Today take extra 1/2 pill then continue 1 pill daily. Recheck in 2 weeks.   Current Anticoagulation Instructions: INR 2.3  Coumadin 2.5 mg tablets - Continue 1 tablet per day

## 2010-09-19 NOTE — Assessment & Plan Note (Signed)
[facility]                            CARDIOLOGY OFFICE NOTE   GUSTAVO, MEDITZ                          MRN:          [id number]  DATE:06/26/2007                            DOB:          June 16, 1950    HISTORY:  Ms. Cohoon is a very pleasant female who I follow for permanent  atrial fibrillation and rheumatic heart disease.  She has had a previous  mitral valvuloplasty at [facility] in June 2000.  Since I last saw  her, she has mild dyspnea on exertion which is unchanged.  There is no  orthopnea, PND, pedal edema, palpitations, presyncope, syncope or chest  pain.  There is no bleeding.   MEDICATIONS:  1. Coumadin as directed.  2. Cardizem CD 180 mg p.o. daily.  3. Effexor 75 mg p.o. daily.  4. Prempro.  5. Calcium.  6. Multivitamin.   PHYSICAL EXAMINATION:  VITAL SIGNS:  Blood pressure of 100/69, pulse is  84.  She weighs 167 pounds.  HEENT:  Normal.  NECK:  Supple.  CHEST:  Clear.  CARDIOVASCULAR:  Irregular rhythm.  There is a 2/6 systolic murmur at  the apex.  EXTREMITIES:  Show no tenderness.  EXTREMITIES:  Show no edema.   DIAGNOSTICS:  Electrocardiogram shows atrial fibrillation at a rate of  84.  There are no significant ST changes.  Prior septal infarct cannot  be excluded.   DIAGNOSES:  1. Permanent atrial fibrillation.  Her rate is adequately controlled      and we will continue with the Cardizem CD at 180 mg p.o. daily.      She will also continue on Coumadin with a goal INR of 2-3.  2. Rheumatic heart disease with mitral stenosis/mitral regurgitation.      We will plan to repeat her echocardiogram both to assess her mitral      stenosis/mitral regurgitation as well as her pulmonary pressures.      She will most likely require mitral valve replacement in the      future.  3. Status post mitral valvuloplasty at [facility] in June 2000.  4. Coumadin therapy.  This is being monitored in our Coumadin Clinic.      I will  check a CBC today as well.   PLAN:  We will see her back in 12 months.  She will continue the SBE  prophylaxis.     Madolyn Frieze Jens Som, MD, [facility]  Electronically Signed    BSC/MedQ  DD: 06/26/2007  DT: 06/27/2007  Job #: [id number]

## 2010-09-19 NOTE — Assessment & Plan Note (Signed)
[facility]ALTHC[facility]                            CARDIOLOGY OFFICE NOTE   ZONIA, CAPLIN                          MRN:          [id number]  DATE:07/02/2008                            DOB:          1950-05-18    Ms. Philyaw is a very pleasant 60 year old female, who has a history of  permanent atrial fibrillation and rheumatic heart disease.  She has had  prior balloon valvuloplasty at [facility] in June 2000.  Her last  echocardiogram was performed on July 15, 2007.  Her ejection fraction  was normal.  There was mild-to-moderate mitral stenosis and moderate  mitral regurgitation.  She had moderate left atrial enlargement.  There  is mild tricuspid regurgitation and mild right atrial enlargement.  Her  pulmonary artery pressure was mildly increased with a tricuspid velocity  of 2.45 m/sec.  Since I last saw her, she is having increased dyspnea on  exertion.  This occurs with moderate activities such as walking up a  hill.  However, there is no orthopnea or PND.  There is no pedal edema.  She has not had chest pain, palpitations, or syncope.  Her dyspnea is  relieved promptly with rest.  There is no associated chest pain.  It is  worse compared to June 26, 2007, when I last saw her last.   MEDICATIONS:  1. Coumadin as directed.  2. Cardizem CD 180 mg p.o. daily.  3. Prempro.  4. Calcium.  5. Multivitamin.  6. Bupropion.  7. Vitamin D.  8. Fish oil.   PHYSICAL EXAMINATION:  VITAL SIGNS:  Today shows a blood pressure of  122/74 and her pulse is 80.  She weighs 166 pounds.  HEENT:  Normal.  NECK:  Supple.  CHEST:  Clear.  CARDIOVASCULAR:  Irregular rhythm.  There is a 2/6 systolic and  diastolic murmur at the apex.  ABDOMEN:  No tenderness.  EXTREMITIES:  No edema.   Her electrocardiogram shows her atrial fibrillation at a rate of 82.  There are no significant ST changes.   DIAGNOSES:  1. Permanent atrial fibrillation - we will continue with  her Cardizem      for rate control as well as Coumadin with goal INR of 2-3.  I will      schedule her to have a 48-hour Holter monitor to make sure that her      rate is adequately controlled.  Certainly if not, this could be      contributing to her increased dyspnea.  2. Rheumatic heart disease/mitral stenosis/mitral regurgitation - we      will plan to repeat her echocardiogram and all also check a BNP.      If her valve has worsened, then she may require valve replacement      in the near future.  She will continue with subacute bacterial      endocarditis prophylaxis.  3. Coumadin therapy - she will continue with goal INR of 2-3.  I will      check a CBC to make sure that she is not anemic, certainly this  could be contributing to dyspnea as well.  4. Status post balloon mitral valvuloplasty at [facility] in June      2000.   I will see her back in approximately 8 weeks.     Madolyn Frieze Jens Som, MD, [facility]  Electronically Signed    BSC/MedQ  DD: 07/02/2008  DT: 07/03/2008  Job #: [id number]

## 2010-09-22 NOTE — Assessment & Plan Note (Signed)
[facility]ALTH[facility]                            CARDIOLOGY OFFICE NOTE   KEELA, RUBERT                          MRN:          [id number]  DATE:05/16/2006                            DOB:          [birth year]    Lorraine Kemp returns for followup today.  Since I last saw her she denies  any dyspnea, chest pain, palpitations or syncope.  There is no pedal  edema.   Her medications at present include:  1. Coumadin as directed.  2. Cardizem CD 180 mg p.o. daily.  3. Effexor ER 75 mg p.o. daily.  4. Prempro.  5. Calcium.  6. A multivitamin.   PHYSICAL EXAMINATION TODAY:  VITAL SIGNS:  Shows a blood pressure of  129/78 and a pulse of 73.  She weighs 179 pounds.  NECK:  Supple with no bruits.  CHEST:  Clear.  CARDIOVASCULAR:  Reveals an irregular rhythm.  There is a 2-3/6 systolic  murmur at the left sternal border.  There is a soft diastolic rumble.  EXTREMITIES:  Show no edema.   Electrocardiogram shows atrial fibrillation with a controlled  ventricular response at 94.  The axis is normal.  There are nonspecific  ST changes.  Prior septal infarct cannot be excluded.   DIAGNOSES:  1. Permanent atrial fibrillation.  2. Rheumatic heart disease with mitral stenosis and mitral      regurgitation.  3. Status post mitral valvuloplasty at [facility] in June 2000.  4. Coumadin therapy.   PLAN:  Lorraine Kemp remains asymptomatic from a cardiac standpoint.  We  will plan to repeat her echocardiogram to reassess her mitral valve  disease and also her pulmonary pressures.  I have explained that she may  require mitral valve replacement in the future.  She will continue on  Coumadin with a goal INR of 2-3.  We will check a CBC today to follow  her hemoglobin and hematocrit.  She will continue on her Cardizem for  her atrial fibrillation.  I will see her back in 12 months.    Madolyn Frieze Jens Som, MD, [facility]  Electronically Signed   BSC/MedQ  DD: 05/16/2006  DT:  05/16/2006  Job #: [id number]   cc:   Teena Irani. Arlyce Dice, M.D.

## 2010-10-20 ENCOUNTER — Other Ambulatory Visit: Payer: Self-pay | Admitting: Cardiology

## 2011-01-16 ENCOUNTER — Encounter: Payer: Self-pay | Admitting: Gastroenterology

## 2011-01-31 ENCOUNTER — Encounter: Payer: Self-pay | Admitting: Cardiology

## 2011-02-02 ENCOUNTER — Encounter: Payer: Self-pay | Admitting: Cardiology

## 2011-02-02 ENCOUNTER — Ambulatory Visit (INDEPENDENT_AMBULATORY_CARE_PROVIDER_SITE_OTHER): Payer: BC Managed Care – PPO | Admitting: Cardiology

## 2011-02-02 DIAGNOSIS — I050 Rheumatic mitral stenosis: Secondary | ICD-10-CM

## 2011-02-02 DIAGNOSIS — I059 Rheumatic mitral valve disease, unspecified: Secondary | ICD-10-CM

## 2011-02-02 DIAGNOSIS — I4891 Unspecified atrial fibrillation: Secondary | ICD-10-CM

## 2011-02-02 DIAGNOSIS — I080 Rheumatic disorders of both mitral and aortic valves: Secondary | ICD-10-CM

## 2011-02-02 NOTE — Assessment & Plan Note (Signed)
Plan repeat echocardiogram. Patient will most likely require mitral valve replacement in the future. She would not be a candidate for repeat balloon valvuloplasty given mitral regurgitation.

## 2011-02-02 NOTE — Assessment & Plan Note (Signed)
I will continue Cardizem and Coumadin.check CBC. Will also check potassium, renal function and cholesterol.

## 2011-02-02 NOTE — Patient Instructions (Signed)
Your physician recommends that you return for a FASTING lipid profile/liver profile/BMP/CBC   424.0  427.31   Your physician has requested that you have an echocardiogram. Echocardiography is a painless test that uses sound waves to create images of your heart. It provides your doctor with information about the size and shape of your heart and how well your heart's chambers and valves are working. This procedure takes approximately one hour. There are no restrictions for this procedure.  Your physician wants you to follow-up in: 1 year with Dr Jens Som. (September 2013). You will receive a reminder letter in the mail two months in advance. If you don't receive a letter, please call our office to schedule the follow-up appointment.

## 2011-02-02 NOTE — Progress Notes (Signed)
HPI:Lorraine Kemp is a very pleasant  female, who has a history of permanent atrial fibrillation and rheumatic heart disease.  She has had prior balloon valvuloplasty at [facility] in June 2000.   We referred her to [facility]. She had a stress echo and was felt to have mild to moderate MS and medical therapy was recommended. Repeat valvuloplasty in the future was felt to be problematic as it may increase MR. Last echo was performed in  Dec 2011 and showed normal LV function, moderate mitral stenosis. Her mitral regurgitation was in the mild to moderate range. There was biatrial enlargement and moderate TR.  I last saw her in July 2011. Since she was last seen she has DOE relieved with rest; no associated chest pain; no orthopnea, pnd, pedal edema, exertional chest pain, palpitations, syncope or bleeding.   Current Outpatient Prescriptions  Medication Sig Dispense Refill  . buPROPion (BUDEPRION XL) 300 MG 24 hr tablet Take 300 mg by mouth daily.        . calcium carbonate (OS-CAL) 600 MG TABS Take 600 mg by mouth daily.       Marland Kitchen diltiazem (CARDIZEM CD) 240 MG 24 hr capsule TAKE 1 CAPSULE DAILY  90 capsule  2  . estrogen, conjugated,-medroxyprogesterone (PREMPRO) 0.3-1.5 MG per tablet Take 1 tablet by mouth daily.        . Multiple Vitamins-Minerals (MULTIVITAMIN WITH MINERALS) tablet Take 1 tablet by mouth daily.        . Omega-3 Fatty Acids (FISH OIL) 1000 MG CAPS Take by mouth.        . Vitamin D, Ergocalciferol, (DRISDOL) 50000 UNITS CAPS Take 50,000 Units by mouth every 7 (seven) days.        Marland Kitchen warfarin (COUMADIN) 2.5 MG tablet Take 2.5 mg by mouth daily. 1 tablet or 1/2 tablet          Past Medical History  Diagnosis Date  . Mitral stenosis 08/26/2008  . MITRAL REGURGITATION 08/26/2008  . RHEUMATIC HEART DISEASE 08/26/2008  . Atrial fibrillation 08/26/2008    Past Surgical History  Procedure Date  . Percutaneous balloon valvuloplasty 10/1998    [facility]  . Cesarean section   .  Tonsillectomy   . Screw in ankles     History   Social History  . Marital Status: Married    Spouse Name: N/A    Number of Children: N/A  . Years of Education: N/A   Occupational History  . Not on file.   Social History Main Topics  . Smoking status: Never Smoker   . Smokeless tobacco: Not on file  . Alcohol Use: Not on file  . Drug Use: No  . Sexually Active: Not on file   Other Topics Concern  . Not on file   Social History Narrative  . No narrative on file    ROS: no fevers or chills, productive cough, hemoptysis, dysphasia, odynophagia, melena, hematochezia, dysuria, hematuria, rash, seizure activity, orthopnea, PND, pedal edema, claudication. Remaining systems are negative.  Physical Exam: Well-developed well-nourished in no acute distress.  Skin is warm and dry.  HEENT is normal.  Neck is supple. No thyromegaly.  Chest is clear to auscultation with normal expansion.  Cardiovascular exam is irregular, 2/6 systolic murmur at apex. Abdominal exam nontender or distended. No masses palpated. Extremities show no edema. neuro grossly intact  ECG atrial fibrillation heart rate of 82. Cannot rule out prior septal infarct.

## 2011-02-02 NOTE — Assessment & Plan Note (Signed)
Repeat echocardiogram. 

## 2011-02-06 ENCOUNTER — Encounter: Payer: Self-pay | Admitting: *Deleted

## 2011-02-08 ENCOUNTER — Other Ambulatory Visit (INDEPENDENT_AMBULATORY_CARE_PROVIDER_SITE_OTHER): Payer: BC Managed Care – PPO | Admitting: *Deleted

## 2011-02-08 ENCOUNTER — Ambulatory Visit (HOSPITAL_COMMUNITY): Payer: BC Managed Care – PPO | Attending: Cardiology

## 2011-02-08 DIAGNOSIS — I059 Rheumatic mitral valve disease, unspecified: Secondary | ICD-10-CM

## 2011-02-08 DIAGNOSIS — I080 Rheumatic disorders of both mitral and aortic valves: Secondary | ICD-10-CM

## 2011-02-08 DIAGNOSIS — I4891 Unspecified atrial fibrillation: Secondary | ICD-10-CM

## 2011-02-08 DIAGNOSIS — I050 Rheumatic mitral stenosis: Secondary | ICD-10-CM

## 2011-02-08 DIAGNOSIS — I079 Rheumatic tricuspid valve disease, unspecified: Secondary | ICD-10-CM | POA: Insufficient documentation

## 2011-02-08 LAB — CBC WITH DIFFERENTIAL/PLATELET
Eosinophils Relative: 0.7 % (ref 0.0–5.0)
HCT: 39.7 % (ref 36.0–46.0)
Hemoglobin: 13.3 g/dL (ref 12.0–15.0)
Lymphs Abs: 1.2 10*3/uL (ref 0.7–4.0)
MCV: 93.1 fl (ref 78.0–100.0)
Monocytes Absolute: 0.4 10*3/uL (ref 0.1–1.0)
Neutro Abs: 4.4 10*3/uL (ref 1.4–7.7)
Platelets: 171 10*3/uL (ref 150.0–400.0)
RDW: 13.6 % (ref 11.5–14.6)
WBC: 6.1 10*3/uL (ref 4.5–10.5)

## 2011-02-08 LAB — BASIC METABOLIC PANEL
Calcium: 9.2 mg/dL (ref 8.4–10.5)
Creatinine, Ser: 0.9 mg/dL (ref 0.4–1.2)
GFR: 70.93 mL/min (ref >60.00)
Glucose, Bld: 89 mg/dL (ref 70–99)
Sodium: 141 mEq/L (ref 135–145)

## 2011-02-08 LAB — LIPID PANEL: Total CHOL/HDL Ratio: 3

## 2011-02-08 LAB — HEPATIC FUNCTION PANEL
AST: 24 U/L (ref 0–37)
Alkaline Phosphatase: 72 U/L (ref 39–117)
Total Bilirubin: 0.8 mg/dL (ref 0.3–1.2)

## 2011-02-15 ENCOUNTER — Encounter: Payer: Self-pay | Admitting: Gastroenterology

## 2011-02-23 ENCOUNTER — Telehealth: Payer: Self-pay | Admitting: Cardiology

## 2011-02-23 NOTE — Telephone Encounter (Signed)
Pt calling to inform MD/nurse that pt will be faxing form to be filled out and sent back to pt insurance company.

## 2011-02-28 ENCOUNTER — Other Ambulatory Visit: Payer: Self-pay | Admitting: Obstetrics and Gynecology

## 2011-02-28 DIAGNOSIS — Z1231 Encounter for screening mammogram for malignant neoplasm of breast: Secondary | ICD-10-CM

## 2011-02-28 NOTE — Telephone Encounter (Signed)
Spoke with pt, she faxed the insurance papers on Friday, do not see. Pt will mail the papers to my attention. Deliah Goody

## 2011-03-19 ENCOUNTER — Encounter: Payer: Self-pay | Admitting: Gastroenterology

## 2011-03-19 ENCOUNTER — Ambulatory Visit (INDEPENDENT_AMBULATORY_CARE_PROVIDER_SITE_OTHER): Payer: BC Managed Care – PPO | Admitting: Gastroenterology

## 2011-03-19 VITALS — BP 118/74 | HR 80 | Ht 65.0 in | Wt 159.0 lb

## 2011-03-19 DIAGNOSIS — Z1211 Encounter for screening for malignant neoplasm of colon: Secondary | ICD-10-CM

## 2011-03-19 MED ORDER — PEG-KCL-NACL-NASULF-NA ASC-C 100 G PO SOLR: 1.0000 | Freq: Once | ORAL | Status: DC

## 2011-03-19 NOTE — Patient Instructions (Signed)
Colonoscopy A colonoscopy is an exam to evaluate your entire colon. In this exam, your colon is cleansed. A long fiberoptic tube is inserted through your rectum and into your colon. The fiberoptic scope (endoscope) is a long bundle of enclosed and very flexible fibers. These fibers transmit light to the area examined and send images from that area to your caregiver. Discomfort is usually minimal. You may be given a drug to help you sleep (sedative) during or prior to the procedure. This exam helps to detect lumps (tumors), polyps, inflammation, and areas of bleeding. Your caregiver may also take a small piece of tissue (biopsy) that will be examined under a microscope. LET YOUR CAREGIVER KNOW ABOUT:   Allergies to food or medicine.   Medicines taken, including vitamins, herbs, eyedrops, over-the-counter medicines, and creams.   Use of steroids (by mouth or creams).   Previous problems with anesthetics or numbing medicines.   History of bleeding problems or blood clots.   Previous surgery.   Other health problems, including diabetes and kidney problems.   Possibility of pregnancy, if this applies.  BEFORE THE PROCEDURE   A clear liquid diet may be required for 2 days before the exam.   Ask your caregiver about changing or stopping your regular medications.   Liquid injections (enemas) or laxatives may be required.   A large amount of electrolyte solution may be given to you to drink over a short period of time. This solution is used to clean out your colon.   You should be present 60 minutes prior to your procedure or as directed by your caregiver.  AFTER THE PROCEDURE   If you received a sedative or pain relieving medication, you will need to arrange for someone to drive you home.   Occasionally, there is a little blood passed with the first bowel movement. Do not be concerned.  FINDING OUT THE RESULTS OF YOUR TEST Not all test results are available during your visit. If your test  results are not back during the visit, make an appointment with your caregiver to find out the results. Do not assume everything is normal if you have not heard from your caregiver or the medical facility. It is important for you to follow up on all of your test results. HOME CARE INSTRUCTIONS   It is not unusual to pass moderate amounts of gas and experience mild abdominal cramping following the procedure. This is due to air being used to inflate your colon during the exam. Walking or a warm pack on your belly (abdomen) may help.   You may resume all normal meals and activities after sedatives and medicines have worn off.   Only take over-the-counter or prescription medicines for pain, discomfort, or fever as directed by your caregiver. Do not use aspirin or blood thinners if a biopsy was taken. Consult your caregiver for medicine usage if biopsies were taken.  SEEK IMMEDIATE MEDICAL CARE IF:   You have a fever.   You pass large blood clots or fill a toilet with blood following the procedure. This may also occur 10 to 14 days following the procedure. This is more likely if a biopsy was taken.   You develop abdominal pain that keeps getting worse and cannot be relieved with medicine.  Document Released: 04/20/2000 Document Revised: 01/03/2011 Document Reviewed: 12/04/2007 Exit[employer] Patient Information 2012 [employer]. 

## 2011-03-19 NOTE — Progress Notes (Signed)
Lorraine Kemp is a pleasant 60 year old white female with history of mitral stenosis, rheumatic heart disease, atrial fibrillation, on Coumadin , referred at the request of Dr. Carney Living for screening colonoscopy. She has no GI complaints including change of bowel habits, dull pain, melena or hematochezia.      Past Medical History  Diagnosis Date  . Mitral stenosis 08/26/2008  . MITRAL REGURGITATION 08/26/2008  . RHEUMATIC HEART DISEASE 08/26/2008  . Atrial fibrillation 08/26/2008   Past Surgical History  Procedure Date  . Percutaneous balloon valvuloplasty 10/1998    [facility]  . Cesarean section   . Tonsillectomy 1956  . Screw in ankles     right   Current Outpatient Prescriptions  Medication Sig Dispense Refill  . buPROPion (BUDEPRION XL) 300 MG 24 hr tablet Take 300 mg by mouth daily.        . calcium carbonate (OS-CAL) 600 MG TABS Take 600 mg by mouth daily.       Marland Kitchen diltiazem (CARDIZEM CD) 240 MG 24 hr capsule TAKE 1 CAPSULE DAILY  90 capsule  2  . estrogen, conjugated,-medroxyprogesterone (PREMPRO) 0.3-1.5 MG per tablet Take 1 tablet by mouth daily.        . Multiple Vitamins-Minerals (MULTIVITAMIN WITH MINERALS) tablet Take 1 tablet by mouth daily.        . Omega-3 Fatty Acids (FISH OIL) 1000 MG CAPS Take by mouth.        . Vitamin D, Ergocalciferol, (DRISDOL) 50000 UNITS CAPS Take 50,000 Units by mouth every 7 (seven) days.        Marland Kitchen warfarin (COUMADIN) 2.5 MG tablet Take 2.5 mg by mouth daily. 1 tablet or 1/2 tablet         reports that she has never smoked. She has never used smokeless tobacco. She reports that she drinks alcohol. She reports that she does not use illicit drugs. family history includes Heart disease in her mother and Lung cancer in her father.    Review of Systems: Pertinent positive and negative review of systems were noted in the above HPI section. All other review of systems were otherwise negative.  Vital signs were reviewed in today's medical  record Physical Exam: General: Well developed , well nourished, no acute distress Head: Normocephalic and atraumatic Eyes:  sclerae anicteric, EOMI Ears: Normal auditory acuity Mouth: No deformity or lesions Neck: Supple, no masses or thyromegaly Lungs: Clear throughout to auscultation Heart: Heart rate is irregularly irregular. There is a prominent S2. There is a 1/6 early systolic murmur. Abdomen: Soft, non tender and non distended. No masses, hepatosplenomegaly or hernias noted. Normal Bowel sounds Rectal:deferred Musculoskeletal: Symmetrical with no gross deformities  Skin: No lesions on visible extremities Pulses:  Normal pulses noted Extremities: No clubbing, cyanosis, edema or deformities noted Neurological: Alert oriented x 4, grossly nonfocal Cervical Nodes:  No significant cervical adenopathy Inguinal Nodes: No significant inguinal adenopathy Psychological:  Alert and cooperative. Normal mood and affect

## 2011-03-19 NOTE — Assessment & Plan Note (Addendum)
Plan screening colonoscopy. I will check with her cardiologist whether Coumadin can be held.

## 2011-03-20 ENCOUNTER — Telehealth: Payer: Self-pay | Admitting: *Deleted

## 2011-03-20 ENCOUNTER — Encounter: Payer: Self-pay | Admitting: Gastroenterology

## 2011-03-20 NOTE — Telephone Encounter (Signed)
Per Dr Latricia Heft note, it is ok to hold coumadin 4 days prior to procedure..Called patient. And informed .the patient aware when to hold coumadin

## 2011-03-21 ENCOUNTER — Ambulatory Visit
Admission: RE | Admit: 2011-03-21 | Discharge: 2011-03-21 | Disposition: A | Discharge status: Home / Self Care | Payer: BC Managed Care – PPO | Source: Ambulatory Visit | Attending: Obstetrics and Gynecology | Admitting: Obstetrics and Gynecology

## 2011-03-21 DIAGNOSIS — Z1231 Encounter for screening mammogram for malignant neoplasm of breast: Secondary | ICD-10-CM

## 2011-04-13 ENCOUNTER — Ambulatory Visit (AMBULATORY_SURGERY_CENTER): Payer: BC Managed Care – PPO | Admitting: Gastroenterology

## 2011-04-13 ENCOUNTER — Encounter: Payer: Self-pay | Admitting: Gastroenterology

## 2011-04-13 VITALS — BP 132/61 | HR 90 | Temp 98.2°F | Resp 18 | Ht 65.0 in | Wt 159.0 lb

## 2011-04-13 DIAGNOSIS — Z1211 Encounter for screening for malignant neoplasm of colon: Secondary | ICD-10-CM

## 2011-04-13 MED ORDER — SODIUM CHLORIDE 0.9 % IV SOLN: 500.0000 mL | INTRAVENOUS | Status: DC

## 2011-04-13 NOTE — Patient Instructions (Addendum)
Resume coumadin today. Discharge instructions given with verbal understanding. Normal exam. Resume previous medications.

## 2011-04-13 NOTE — Op Note (Signed)
[facility] [address] [place], [place]  [address]  COLONOSCOPY PROCEDURE REPORT  PATIENT:  Lorraine, Kemp  MR#:  [id number] BIRTHDATE:  1950/05/18, 60 yrs. old  GENDER:  female ENDOSCOPIST:  Barbette Hair. Arlyce Dice, MD REF. BY:  Herb Grays, M.D. PROCEDURE DATE:  04/13/2011 PROCEDURE:  Diagnostic Colonoscopy ASA CLASS:  Class II INDICATIONS:  Routine Risk Screening MEDICATIONS:   These medications were titrated to patient response per physician's verbal order, Fentanyl 100 mcg IV, Versed 10 mg IV  DESCRIPTION OF PROCEDURE:   After the risks benefits and alternatives of the procedure were thoroughly explained, informed consent was obtained.  Digital rectal exam was performed and revealed no abnormalities.   The [id number] endoscope was introduced through the anus and advanced to the cecum, which was identified by both the appendix and ileocecal valve, without limitations.  The quality of the prep was excellent, using MoviPrep.  The instrument was then slowly withdrawn as the colon was fully examined. <<PROCEDUREIMAGES>>  FINDINGS:  A normal appearing cecum, ileocecal valve, and appendiceal orifice were identified. The ascending, hepatic flexure, transverse, splenic flexure, descending, sigmoid colon, and rectum appeared unremarkable (see image1 and image2). Retroflexed views in the rectum revealed no abnormalities.    The time to cecum =  1) 5.75  minutes. The scope was then withdrawn in 1) 6.0  minutes from the cecum and the procedure completed. COMPLICATIONS:  None ENDOSCOPIC IMPRESSION: 1) Normal colon RECOMMENDATIONS: 1) Continue current colorectal screening recommendations for "routine risk" patients with a repeat colonoscopy in 10 years. 2) resume coumadin today REPEAT EXAM:  In 10 year(s) for Colonoscopy.  ______________________________ Barbette Hair. Arlyce Dice, MD  CC:  n. eSIGNED:   Barbette Hair. Nollan Muldrow at 04/13/2011 02:21 PM  Illene Regulus, [id number]

## 2011-04-13 NOTE — Progress Notes (Signed)
Patient did not experience any of the following events: a burn prior to discharge; a fall within the facility; wrong site/side/patient/procedure/implant event; or a hospital transfer or hospital admission upon discharge from the facility. (G8907) Patient did not have preoperative order for IV antibiotic SSI prophylaxis. (G8918)  

## 2011-04-16 ENCOUNTER — Telehealth: Payer: Self-pay

## 2011-04-16 NOTE — Telephone Encounter (Signed)

## 2011-06-25 ENCOUNTER — Other Ambulatory Visit: Payer: Self-pay | Admitting: Cardiology

## 2011-12-13 ENCOUNTER — Other Ambulatory Visit: Payer: Self-pay | Admitting: Cardiology

## 2012-02-20 ENCOUNTER — Telehealth: Payer: Self-pay | Admitting: Cardiology

## 2012-02-20 NOTE — Telephone Encounter (Signed)
Pt needs an activation code for mychart, pls call

## 2012-02-22 NOTE — Telephone Encounter (Signed)
Called patient and gave patient the information

## 2012-02-26 ENCOUNTER — Other Ambulatory Visit: Payer: Self-pay | Admitting: Obstetrics and Gynecology

## 2012-02-26 DIAGNOSIS — Z1231 Encounter for screening mammogram for malignant neoplasm of breast: Secondary | ICD-10-CM

## 2012-02-27 ENCOUNTER — Other Ambulatory Visit: Payer: Self-pay | Admitting: Cardiology

## 2012-03-12 ENCOUNTER — Encounter: Payer: Self-pay | Admitting: Cardiology

## 2012-03-20 ENCOUNTER — Ambulatory Visit (INDEPENDENT_AMBULATORY_CARE_PROVIDER_SITE_OTHER): Payer: BC Managed Care – PPO | Admitting: Cardiology

## 2012-03-20 ENCOUNTER — Encounter: Payer: Self-pay | Admitting: Cardiology

## 2012-03-20 VITALS — BP 122/80 | HR 72 | Ht 66.0 in | Wt 157.4 lb

## 2012-03-20 DIAGNOSIS — I4891 Unspecified atrial fibrillation: Secondary | ICD-10-CM

## 2012-03-20 DIAGNOSIS — I059 Rheumatic mitral valve disease, unspecified: Secondary | ICD-10-CM

## 2012-03-20 DIAGNOSIS — R42 Dizziness and giddiness: Secondary | ICD-10-CM

## 2012-03-20 DIAGNOSIS — G459 Transient cerebral ischemic attack, unspecified: Secondary | ICD-10-CM

## 2012-03-20 NOTE — Assessment & Plan Note (Signed)
Continue Cardizem and Coumadin. Her INR is followed by her primary care. We will obtain her most recent laboratories including hemoglobin.

## 2012-03-20 NOTE — Assessment & Plan Note (Signed)
Plan repeat echocardiogram. She will most likely need mitral valve replacement in the future. She will not be a candidate for redo mitral valvuloplasty as she has had mitral regurgitation previously.

## 2012-03-20 NOTE — Progress Notes (Signed)
HPI: Ms. Lorraine Kemp is a very pleasant female, who has a history of permanent atrial fibrillation and rheumatic heart disease. She has had prior balloon valvuloplasty at [facility] in June 2000. We previously referred her to [facility]. She had a stress echo and was felt to have mild to moderate MS and medical therapy was recommended. Repeat valvuloplasty in the future was felt to be problematic as it may increase MR. Last echo was performed in Oct 2012. Left ventricular function was normal. 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 There was severe left atrial enlargement and mild right atrial enlargement. Trace aortic insufficiency. Mild pulmonary hypertension. I last saw her in Sept 2012. Since she was last seen she has DOE relieved with rest; no associated chest pain; no orthopnea, pnd, pedal edema, exertional chest pain, palpitations, syncope or bleeding. She has had several "dizzy spells". They're sudden in onset and not related to position. They last less than 1 minute. No associated palpitations, chest pain or dyspnea. No loss of strength or sensation in her extremities or dysarthria.   Current Outpatient Prescriptions  Medication Sig Dispense Refill  . buPROPion (BUDEPRION XL) 300 MG 24 hr tablet Take 300 mg by mouth daily.        . calcium carbonate (OS-CAL) 600 MG TABS Take 600 mg by mouth daily.       Marland Kitchen diltiazem (CARDIZEM CD) 240 MG 24 hr capsule TAKE 1 CAPSULE DAILY  90 capsule  3  . estrogen, conjugated,-medroxyprogesterone (PREMPRO) 0.3-1.5 MG per tablet Take 1 tablet by mouth daily.        . Multiple Vitamins-Minerals (MULTIVITAMIN WITH MINERALS) tablet Take 1 tablet by mouth daily.        . Omega-3 Fatty Acids (FISH OIL) 1000 MG CAPS Take by mouth.        . Vitamin D, Ergocalciferol, (DRISDOL) 50000 UNITS CAPS Take 50,000 Units by mouth every 7 (seven) days.        Marland Kitchen warfarin (COUMADIN) 2.5 MG tablet Take 2.5 mg by mouth daily. 1  tablet or 1/2 tablet          Past Medical History  Diagnosis Date  . Mitral stenosis 08/26/2008  . MITRAL REGURGITATION 08/26/2008  . RHEUMATIC HEART DISEASE 08/26/2008  . Atrial fibrillation 08/26/2008    Past Surgical History  Procedure Date  . Percutaneous balloon valvuloplasty 10/1998    [facility]  . Cesarean section   . Tonsillectomy 1956  . Screw in ankles     right  . Colonoscopy     History   Social History  . Marital Status: Married    Spouse Name: N/A    Number of Children: 2  . Years of Education: N/A   Occupational History  . branch office administrator    Social History Main Topics  . Smoking status: Never Smoker   . Smokeless tobacco: Never Used  . Alcohol Use: Yes     Comment: occas glass of wine  . Drug Use: No  . Sexually Active: Not on file   Other Topics Concern  . Not on file   Social History Narrative  . No narrative on file    ROS: no fevers or chills, productive cough, hemoptysis, dysphasia, odynophagia, melena, hematochezia, dysuria, hematuria, rash, seizure activity, orthopnea, PND, pedal edema, claudication. Remaining systems are negative.  Physical Exam: Well-developed well-nourished in no acute distress.  Skin is warm and dry.  HEENT is normal.  Neck is  supple.  Chest is clear to auscultation with normal expansion.  Cardiovascular exam is irregular Abdominal exam nontender or distended. No masses palpated. Extremities show no edema. neuro grossly intact  ECG atrial fibrillation at a rate of 74. Normal axis. Cannot rule out prior septal infarct.

## 2012-03-20 NOTE — Patient Instructions (Addendum)
Your physician wants you to follow-up in: ONE YEAR WITH DR CRENSHAW You will receive a reminder letter in the mail two months in advance. If you don't receive a letter, please call our office to schedule the follow-up appointment.   Your physician has requested that you have an echocardiogram. Echocardiography is a painless test that uses sound waves to create images of your heart. It provides your doctor with information about the size and shape of your heart and how well your heart's chambers and valves are working. This procedure takes approximately one hour. There are no restrictions for this procedure.   Your physician has requested that you have a carotid duplex. This test is an ultrasound of the carotid arteries in your neck. It looks at blood flow through these arteries that supply the brain with blood. Allow one hour for this exam. There are no restrictions or special instructions.   

## 2012-03-20 NOTE — Assessment & Plan Note (Signed)
Etiology unclear. Question TIA. Schedule carotid Dopplers. Continue Coumadin with goal INR greater than 2. If her symptoms persist we'll consider a CardioNet.

## 2012-03-24 ENCOUNTER — Encounter (INDEPENDENT_AMBULATORY_CARE_PROVIDER_SITE_OTHER): Payer: BC Managed Care – PPO

## 2012-03-24 DIAGNOSIS — G459 Transient cerebral ischemic attack, unspecified: Secondary | ICD-10-CM

## 2012-03-24 DIAGNOSIS — R42 Dizziness and giddiness: Secondary | ICD-10-CM

## 2012-03-25 ENCOUNTER — Ambulatory Visit (HOSPITAL_COMMUNITY): Payer: BC Managed Care – PPO | Attending: Cardiology | Admitting: Radiology

## 2012-03-25 DIAGNOSIS — I4891 Unspecified atrial fibrillation: Secondary | ICD-10-CM | POA: Insufficient documentation

## 2012-03-25 DIAGNOSIS — R42 Dizziness and giddiness: Secondary | ICD-10-CM | POA: Insufficient documentation

## 2012-03-25 DIAGNOSIS — I517 Cardiomegaly: Secondary | ICD-10-CM | POA: Insufficient documentation

## 2012-03-25 DIAGNOSIS — R0989 Other specified symptoms and signs involving the circulatory and respiratory systems: Secondary | ICD-10-CM | POA: Insufficient documentation

## 2012-03-25 DIAGNOSIS — Z8673 Personal history of transient ischemic attack (TIA), and cerebral infarction without residual deficits: Secondary | ICD-10-CM | POA: Insufficient documentation

## 2012-03-25 DIAGNOSIS — R0609 Other forms of dyspnea: Secondary | ICD-10-CM | POA: Insufficient documentation

## 2012-03-25 DIAGNOSIS — I059 Rheumatic mitral valve disease, unspecified: Secondary | ICD-10-CM

## 2012-03-25 DIAGNOSIS — I099 Rheumatic heart disease, unspecified: Secondary | ICD-10-CM | POA: Insufficient documentation

## 2012-03-25 NOTE — Progress Notes (Signed)
Echocardiogram performed.  

## 2012-03-26 ENCOUNTER — Other Ambulatory Visit: Payer: Self-pay | Admitting: *Deleted

## 2012-03-26 DIAGNOSIS — R9389 Abnormal findings on diagnostic imaging of other specified body structures: Secondary | ICD-10-CM

## 2012-03-28 ENCOUNTER — Ambulatory Visit: Payer: BC Managed Care – PPO

## 2012-04-09 ENCOUNTER — Ambulatory Visit (HOSPITAL_COMMUNITY)
Admission: RE | Admit: 2012-04-09 | Discharge: 2012-04-09 | Disposition: A | Discharge status: Home / Self Care | Payer: BC Managed Care – PPO | Source: Ambulatory Visit | Attending: Cardiology | Admitting: Cardiology

## 2012-04-09 DIAGNOSIS — E042 Nontoxic multinodular goiter: Secondary | ICD-10-CM | POA: Insufficient documentation

## 2012-04-09 DIAGNOSIS — R9389 Abnormal findings on diagnostic imaging of other specified body structures: Secondary | ICD-10-CM | POA: Insufficient documentation

## 2012-04-10 ENCOUNTER — Other Ambulatory Visit: Payer: Self-pay | Admitting: *Deleted

## 2012-04-10 DIAGNOSIS — E042 Nontoxic multinodular goiter: Secondary | ICD-10-CM

## 2012-04-11 ENCOUNTER — Ambulatory Visit
Admission: RE | Admit: 2012-04-11 | Discharge: 2012-04-11 | Disposition: A | Discharge status: Home / Self Care | Payer: BC Managed Care – PPO | Source: Ambulatory Visit | Attending: Obstetrics and Gynecology | Admitting: Obstetrics and Gynecology

## 2012-04-11 DIAGNOSIS — Z1231 Encounter for screening mammogram for malignant neoplasm of breast: Secondary | ICD-10-CM

## 2012-04-14 ENCOUNTER — Telehealth: Payer: Self-pay | Admitting: Cardiology

## 2012-04-14 NOTE — Telephone Encounter (Signed)
Spoke to patient she stated she was calling to see if appointment has been made for  needle biopsy of her thyroid.Patient was told Dr.Crenshaw's nurse not in office today,will forward to Deliah Goody RN.

## 2012-04-14 NOTE — Telephone Encounter (Signed)
plz return call to pt [phone] regarding referral to [place] [facility]

## 2012-04-15 NOTE — Telephone Encounter (Signed)
Spoke with pt, she has talked with  imaging and everything is set up.

## 2012-04-21 ENCOUNTER — Other Ambulatory Visit: Payer: Self-pay | Admitting: Cardiology

## 2012-04-21 DIAGNOSIS — E042 Nontoxic multinodular goiter: Secondary | ICD-10-CM

## 2012-04-22 ENCOUNTER — Ambulatory Visit
Admission: RE | Admit: 2012-04-22 | Discharge: 2012-04-22 | Disposition: A | Discharge status: Home / Self Care | Payer: BC Managed Care – PPO | Source: Ambulatory Visit | Attending: Cardiology | Admitting: Cardiology

## 2012-04-22 ENCOUNTER — Other Ambulatory Visit: Payer: BC Managed Care – PPO

## 2012-04-22 ENCOUNTER — Other Ambulatory Visit (HOSPITAL_COMMUNITY)
Admission: RE | Admit: 2012-04-22 | Discharge: 2012-04-22 | Disposition: A | Discharge status: Home / Self Care | Payer: BC Managed Care – PPO | Source: Ambulatory Visit | Attending: Interventional Radiology | Admitting: Interventional Radiology

## 2012-04-22 DIAGNOSIS — E042 Nontoxic multinodular goiter: Secondary | ICD-10-CM

## 2012-04-22 DIAGNOSIS — E049 Nontoxic goiter, unspecified: Secondary | ICD-10-CM | POA: Insufficient documentation

## 2012-04-23 ENCOUNTER — Other Ambulatory Visit: Payer: Self-pay | Admitting: *Deleted

## 2012-04-23 DIAGNOSIS — R9389 Abnormal findings on diagnostic imaging of other specified body structures: Secondary | ICD-10-CM

## 2012-05-14 ENCOUNTER — Encounter (INDEPENDENT_AMBULATORY_CARE_PROVIDER_SITE_OTHER): Payer: Self-pay | Admitting: Surgery

## 2012-05-14 ENCOUNTER — Encounter (INDEPENDENT_AMBULATORY_CARE_PROVIDER_SITE_OTHER): Payer: Self-pay

## 2012-05-14 ENCOUNTER — Ambulatory Visit (INDEPENDENT_AMBULATORY_CARE_PROVIDER_SITE_OTHER): Payer: BC Managed Care – PPO | Admitting: Surgery

## 2012-05-14 VITALS — BP 124/86 | HR 80 | Temp 97.5°F | Resp 16 | Ht 65.0 in | Wt 161.4 lb

## 2012-05-14 DIAGNOSIS — D440 Neoplasm of uncertain behavior of thyroid gland: Secondary | ICD-10-CM

## 2012-05-14 DIAGNOSIS — D449 Neoplasm of uncertain behavior of unspecified endocrine gland: Secondary | ICD-10-CM

## 2012-05-14 NOTE — Progress Notes (Signed)
General Surgery [facility]  Chief Complaint  Patient presents with  . New Evaluation    new pt - eval thyroid nodules with atypia - referral from Dr. Olga Millers, [facility]    HISTORY: Patient is a very pleasant 62 year old white female referred by her cardiologist for a newly identified thyroid nodules. Patient had been undergoing evaluation for dizziness. This included a carotid duplex. Incidental finding of thyroid nodules was noted. Patient underwent a subsequent thyroid ultrasound in December 2013. This showed bilateral thyroid nodules in an otherwise normal sized thyroid gland. There was a dominant nodule in the left lobe mid section measuring 12 mm with microcalcifications. 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 A second 2 cm nodule in the lower left lobe showed benign follicular changes on needle aspiration. Patient is now referred for consideration for thyroidectomy for definitive diagnosis and management.  Patient has no prior history of thyroid disease. She has never been on thyroid medication. She has had no prior head or neck surgery. There is no family history of thyroid disease and specifically no history of thyroid cancer. There is no family history of other endocrinopathy.  Past Medical History  Diagnosis Date  . Mitral stenosis 08/26/2008  . MITRAL REGURGITATION 08/26/2008  . RHEUMATIC HEART DISEASE 08/26/2008  . Atrial fibrillation 08/26/2008     Current Outpatient Prescriptions  Medication Sig Dispense Refill  . buPROPion (BUDEPRION XL) 300 MG 24 hr tablet Take 300 mg by mouth daily.        . calcium carbonate (OS-CAL) 600 MG TABS Take 600 mg by mouth daily.       Marland Kitchen diltiazem (CARDIZEM CD) 240 MG 24 hr capsule TAKE 1 CAPSULE DAILY  90 capsule  3  . estrogen, conjugated,-medroxyprogesterone (PREMPRO) 0.3-1.5 MG per tablet Take 1 tablet by mouth daily.        . Multiple Vitamins-Minerals (MULTIVITAMIN WITH  MINERALS) tablet Take 1 tablet by mouth daily.        . Omega-3 Fatty Acids (FISH OIL) 1000 MG CAPS Take by mouth.        . Vitamin D, Ergocalciferol, (DRISDOL) 50000 UNITS CAPS Take 50,000 Units by mouth every 7 (seven) days.        Marland Kitchen warfarin (COUMADIN) 2.5 MG tablet Take 2.5 mg by mouth daily. 1 tablet or 1/2 tablet          No Known Allergies   Family History  Problem Relation Age of Onset  . Lung cancer Father   . Cancer Father     lung  . Heart disease Mother     S/P CABG  . Cancer Brother     brain  . Heart disease Maternal Grandmother      History   Social History  . Marital Status: Married    Spouse Name: N/A    Number of Children: 2  . Years of Education: N/A   Occupational History  . branch office administrator    Social History Main Topics  . Smoking status: Never Smoker   . Smokeless tobacco: Never Used  . Alcohol Use: Yes     Comment: occas glass of wine  . Drug Use: No  . Sexually Active: None   Other Topics Concern  . None   Social History Narrative  . None     REVIEW OF SYSTEMS - PERTINENT POSITIVES ONLY: No compressive symptoms. Denies tremor. Denies palpitations.  EXAMCeasar Mons Vitals:   05/14/12 1004  BP: 124/86  Pulse: 80  Temp: 97.5 F (36.4 C)  Resp: 16    HEENT: normocephalic; pupils equal and reactive; sclerae clear; dentition good; mucous membranes moist NECK:  No palpable abnormality in the thyroid bed; symmetric on extension; no palpable anterior or posterior cervical lymphadenopathy; no supraclavicular masses; no tenderness CHEST: clear to auscultation bilaterally without rales, rhonchi, or wheezes CARDIAC: regular rate and rhythm without significant murmur; peripheral pulses are full EXT:  non-tender without edema; no deformity NEURO: no gross focal deficits; no sign of tremor   LABORATORY RESULTS: See [facility] (CHL-Epic) for most recent results   RADIOLOGY RESULTS: See [facility] (CHL-Epic) for most  recent results   IMPRESSION: #1 multinodular thyroid #2 left thyroid nodule, 12 mm, with microcalcifications and cytologic atypia #3 atrial fibrillation on chronic anticoagulation  PLAN: I had a lengthy discussion today with the patient and her husband. I provided them with written literature to review. We discussed the findings of bilateral thyroid nodules and the dominant nodule on the left with findings worrisome for possible malignancy. I believe her risk of malignancy is approximately 25-30%.  I have recommended total thyroidectomy for definitive diagnosis and management. We have discussed the possibility of radioactive iodine treatment in the event of malignancy.  We discussed the procedure of total thyroidectomy at length. We discussed the hospital stay and the postoperative recovery to be anticipated. We discussed the location of the surgical incision and cosmetic results to be anticipated. We discussed potential risk of the procedure including recurrent laryngeal nerve injury and injury to parathyroid glands. We discussed the need for lifelong thyroid hormone replacement following thyroidectomy. They understand and wish to proceed with surgery in the near future.  The risks and benefits of the procedure have been discussed at length with the patient.  The patient understands the proposed procedure, potential alternative treatments, and the course of recovery to be expected.  All of the patient's questions have been answered at this time.  The patient wishes to proceed with surgery.  Velora Heckler, MD, FACS General & Endocrine Surgery [facility]   Visit Diagnoses: 1. Neoplasm of uncertain behavior of thyroid gland     Primary Care Physician: Herb Grays, MD

## 2012-05-14 NOTE — Patient Instructions (Signed)

## 2012-05-16 ENCOUNTER — Telehealth (INDEPENDENT_AMBULATORY_CARE_PROVIDER_SITE_OTHER): Payer: Self-pay

## 2012-05-16 NOTE — Telephone Encounter (Signed)
Received msg from Debra Dr Madaline Savage nurse that Dr Dewain Penning takes care of pts blood thinners and should be contacted to start lovenox bridge once date of surgery is determined. Surgery orders given to [facility] with request to notify me with date of surg so we can coord lovenox bridge.

## 2012-05-19 ENCOUNTER — Encounter (HOSPITAL_COMMUNITY): Payer: Self-pay | Admitting: Pharmacy Technician

## 2012-05-19 ENCOUNTER — Telehealth (INDEPENDENT_AMBULATORY_CARE_PROVIDER_SITE_OTHER): Payer: Self-pay

## 2012-05-19 NOTE — Patient Instructions (Signed)
Lorraine Kemp  05/19/2012   Your procedure is scheduled on:05/30/12   Report to [facility] at 0515 AM.  Call this number if you have problems the morning of surgery: [phone]   Remember:   Do not eat food or drink liquids after midnight.   Take these medicines the morning of surgery with A SIP OF WATER:    Do not wear jewelry, make-up or nail polish.  Do not wear lotions, powders, or perfumes.    Do not shave 48 hours prior to surgery.    Do not bring valuables to the hospital.  Contacts, dentures or bridgework may not be worn into surgery.  Leave suitcase in the car. After surgery it may be brought to your room.  For patients admitted to the hospital, checkout time is 11:00 AM the day of  discharge.               SEE CHG INSTRUCTION SHEET    Please read over the following fact sheets that you were given: MRSA Information, coughing and deep breathing exercises, leg exercises.              Failure to comply with these instructions may result in cancellation of your surgery.                Patient Signature __________________________             Nurse Signature __________________________

## 2012-05-19 NOTE — Telephone Encounter (Signed)
Pam at Dr Alda Berthold office notified of need for lovenox bridge for thyroid surgery on 05-30-12. Per Dr Jens Som pt should follow up with Dr Collins Scotland for this bridge since she manages pts blood thinner.  She request I fax this info to Dr Collins Scotland. Note faxed to Dr Collins Scotland [phone].

## 2012-05-19 NOTE — Telephone Encounter (Signed)
Pt notified that I have faxed request to Dr Alda Berthold office to manage lovenox bridge for surgery. Pt advise if she does not hear from their office within 48hrs she needs to contact their office or let me know to follow up. Pt states she understands.

## 2012-05-20 ENCOUNTER — Encounter (HOSPITAL_COMMUNITY): Payer: Self-pay

## 2012-05-20 ENCOUNTER — Encounter (HOSPITAL_COMMUNITY)
Admission: RE | Admit: 2012-05-20 | Discharge: 2012-05-20 | Disposition: A | Discharge status: Home / Self Care | Payer: BC Managed Care – PPO | Source: Ambulatory Visit | Attending: Surgery | Admitting: Surgery

## 2012-05-20 ENCOUNTER — Ambulatory Visit (HOSPITAL_COMMUNITY)
Admission: RE | Admit: 2012-05-20 | Discharge: 2012-05-20 | Disposition: A | Discharge status: Home / Self Care | Payer: BC Managed Care – PPO | Source: Ambulatory Visit | Attending: Surgery | Admitting: Surgery

## 2012-05-20 DIAGNOSIS — Z01818 Encounter for other preprocedural examination: Secondary | ICD-10-CM | POA: Insufficient documentation

## 2012-05-20 DIAGNOSIS — I4891 Unspecified atrial fibrillation: Secondary | ICD-10-CM | POA: Insufficient documentation

## 2012-05-20 DIAGNOSIS — I10 Essential (primary) hypertension: Secondary | ICD-10-CM | POA: Insufficient documentation

## 2012-05-20 DIAGNOSIS — J438 Other emphysema: Secondary | ICD-10-CM | POA: Insufficient documentation

## 2012-05-20 DIAGNOSIS — Z01812 Encounter for preprocedural laboratory examination: Secondary | ICD-10-CM | POA: Insufficient documentation

## 2012-05-20 HISTORY — DX: Depression, unspecified: F32.A

## 2012-05-20 HISTORY — DX: Major depressive disorder, single episode, unspecified: F32.9

## 2012-05-20 LAB — CBC
HCT: 40.8 % (ref 36.0–46.0)
MCH: 30.8 pg (ref 26.0–34.0)
MCV: 90.5 fL (ref 78.0–100.0)
RDW: 12.9 % (ref 11.5–15.5)
WBC: 7.1 10*3/uL (ref 4.0–10.5)

## 2012-05-20 LAB — BASIC METABOLIC PANEL
BUN: 12 mg/dL (ref 6–23)
Chloride: 103 mEq/L (ref 96–112)
Creatinine, Ser: 0.86 mg/dL (ref 0.50–1.10)
GFR calc Af Amer: 82 mL/min — ABNORMAL LOW (ref >90)

## 2012-05-20 NOTE — Progress Notes (Signed)
03/20/12 EKG EPIC  03/20/13 Last office visit with Dr Olga Millers EPIC

## 2012-05-20 NOTE — Progress Notes (Signed)
Left message for patient to give me a call regarding the pcr results.

## 2012-05-20 NOTE — Progress Notes (Signed)
Patient returned call regarding PCR results.  Patient made aware that results would not be back until 05/23/12 am.  Patient voiced understanding.  Instructed patient that I would call her 05/23/12 am to let her know of results.

## 2012-05-20 NOTE — Progress Notes (Signed)
Carotid Duplex 03/24/12 EPIC  ECHO 03/25/12 EPIC

## 2012-05-22 ENCOUNTER — Telehealth: Payer: Self-pay

## 2012-05-22 LAB — MRSA CULTURE

## 2012-05-22 NOTE — Telephone Encounter (Signed)
Received phone message on triage phone from [facility] requesting surgical clearance from Dr.Crenshaw.Please call back 05/23/12 [phone].Message sent to Dr.Crenshaw's nurse.

## 2012-05-22 NOTE — Progress Notes (Signed)
Quick Note:  These results are acceptable for scheduled surgery.  Shakima Nisley M. Glorianna Gott, MD, FACS [facility] Office: [phone]   ______ 

## 2012-05-23 NOTE — Telephone Encounter (Signed)
Message forwarded through [facility]. Per dr Jens Som pt is clear for surgery but will need lovenox bridge.

## 2012-05-26 NOTE — Progress Notes (Signed)
Quick Note:  These results are acceptable for scheduled surgery.  Shawnelle Spoerl M. Alanmichael Barmore, MD, FACS [facility] Office: [phone]   ______ 

## 2012-05-27 ENCOUNTER — Telehealth (INDEPENDENT_AMBULATORY_CARE_PROVIDER_SITE_OTHER): Payer: Self-pay

## 2012-05-27 NOTE — Telephone Encounter (Signed)
I called pt to verify she has instructions to d/c blood thinners and for her lovenox bridge. Pt states she has been instructed by Dr Collins Scotland and has stopped her blood thinner and began her lovenox. Pt advised if she has any questions or concerns re: lovenox to be sure to call Dr Collins Scotland. Pt states she understands.

## 2012-05-30 ENCOUNTER — Inpatient Hospital Stay (HOSPITAL_COMMUNITY): Payer: BC Managed Care – PPO | Admitting: Anesthesiology

## 2012-05-30 ENCOUNTER — Encounter (HOSPITAL_COMMUNITY): Payer: Self-pay | Admitting: Anesthesiology

## 2012-05-30 ENCOUNTER — Telehealth (INDEPENDENT_AMBULATORY_CARE_PROVIDER_SITE_OTHER): Payer: Self-pay

## 2012-05-30 ENCOUNTER — Encounter (HOSPITAL_COMMUNITY): Payer: Self-pay | Admitting: *Deleted

## 2012-05-30 ENCOUNTER — Encounter (HOSPITAL_COMMUNITY)
Admission: RE | Disposition: A | Discharge status: Home / Self Care | Payer: Self-pay | Source: Ambulatory Visit | Attending: Surgery

## 2012-05-30 ENCOUNTER — Observation Stay (HOSPITAL_COMMUNITY)
Admission: RE | Admit: 2012-05-30 | Discharge: 2012-05-31 | Disposition: A | Discharge status: Home / Self Care | Payer: BC Managed Care – PPO | Source: Ambulatory Visit | Attending: Surgery | Admitting: Surgery

## 2012-05-30 DIAGNOSIS — I4891 Unspecified atrial fibrillation: Secondary | ICD-10-CM | POA: Insufficient documentation

## 2012-05-30 DIAGNOSIS — Z7901 Long term (current) use of anticoagulants: Secondary | ICD-10-CM | POA: Insufficient documentation

## 2012-05-30 DIAGNOSIS — E890 Postprocedural hypothyroidism: Secondary | ICD-10-CM | POA: Diagnosis not present

## 2012-05-30 DIAGNOSIS — E042 Nontoxic multinodular goiter: Principal | ICD-10-CM | POA: Insufficient documentation

## 2012-05-30 DIAGNOSIS — E041 Nontoxic single thyroid nodule: Secondary | ICD-10-CM

## 2012-05-30 DIAGNOSIS — D440 Neoplasm of uncertain behavior of thyroid gland: Secondary | ICD-10-CM | POA: Diagnosis present

## 2012-05-30 HISTORY — PX: THYROIDECTOMY: SHX17

## 2012-05-30 LAB — PROTIME-INR: Prothrombin Time: 13.4 seconds (ref 11.6–15.2)

## 2012-05-30 SURGERY — THYROIDECTOMY
Anesthesia: General | Site: Neck | Wound class: Clean

## 2012-05-30 MED ORDER — ACETAMINOPHEN 10 MG/ML IV SOLN
INTRAVENOUS | Status: DC | PRN
  Administered 2012-05-30: 1000 mg via INTRAVENOUS

## 2012-05-30 MED ORDER — HYDROMORPHONE HCL PF 1 MG/ML IJ SOLN
1.0000 mg | INTRAMUSCULAR | Status: DC | PRN
  Administered 2012-05-30: 1 mg via INTRAVENOUS
  Filled 2012-05-30: qty 1

## 2012-05-30 MED ORDER — ONDANSETRON HCL 4 MG/2ML IJ SOLN
4.0000 mg | Freq: Four times a day (QID) | INTRAMUSCULAR | Status: DC | PRN
  Administered 2012-05-30: 4 mg via INTRAVENOUS
  Filled 2012-05-30: qty 2

## 2012-05-30 MED ORDER — SUCCINYLCHOLINE CHLORIDE 20 MG/ML IJ SOLN
INTRAMUSCULAR | Status: DC | PRN
  Administered 2012-05-30: 100 mg via INTRAVENOUS

## 2012-05-30 MED ORDER — MIDAZOLAM HCL 5 MG/5ML IJ SOLN
INTRAMUSCULAR | Status: DC | PRN
  Administered 2012-05-30: 1 mg via INTRAVENOUS

## 2012-05-30 MED ORDER — KCL IN DEXTROSE-NACL 20-5-0.45 MEQ/L-%-% IV SOLN
INTRAVENOUS | Status: DC
  Administered 2012-05-30 – 2012-05-31 (×2): via INTRAVENOUS
  Filled 2012-05-30 (×3): qty 1000

## 2012-05-30 MED ORDER — ACETAMINOPHEN 325 MG PO TABS: 650.0000 mg | ORAL_TABLET | ORAL | Status: DC | PRN

## 2012-05-30 MED ORDER — ONDANSETRON HCL 4 MG/2ML IJ SOLN
INTRAMUSCULAR | Status: DC | PRN
  Administered 2012-05-30 (×2): 2 mg via INTRAVENOUS

## 2012-05-30 MED ORDER — MEPERIDINE HCL 50 MG/ML IJ SOLN: 6.2500 mg | INTRAMUSCULAR | Status: DC | PRN

## 2012-05-30 MED ORDER — BUPROPION HCL ER (XL) 300 MG PO TB24
300.0000 mg | ORAL_TABLET | Freq: Every morning | ORAL | Status: DC
  Filled 2012-05-30: qty 1

## 2012-05-30 MED ORDER — LACTATED RINGERS IV SOLN: INTRAVENOUS | Status: DC

## 2012-05-30 MED ORDER — NEOSTIGMINE METHYLSULFATE 1 MG/ML IJ SOLN
INTRAMUSCULAR | Status: DC | PRN
  Administered 2012-05-30: 3 mg via INTRAVENOUS

## 2012-05-30 MED ORDER — EPHEDRINE SULFATE 50 MG/ML IJ SOLN
INTRAMUSCULAR | Status: DC | PRN
  Administered 2012-05-30 (×2): 5 mg via INTRAVENOUS

## 2012-05-30 MED ORDER — DILTIAZEM HCL ER COATED BEADS 240 MG PO CP24
240.0000 mg | ORAL_CAPSULE | Freq: Every morning | ORAL | Status: DC
  Filled 2012-05-30: qty 1

## 2012-05-30 MED ORDER — CEFAZOLIN SODIUM-DEXTROSE 2-3 GM-% IV SOLR
2.0000 g | INTRAVENOUS | Status: AC
  Administered 2012-05-30: 2 g via INTRAVENOUS

## 2012-05-30 MED ORDER — ONDANSETRON HCL 4 MG PO TABS: 4.0000 mg | ORAL_TABLET | Freq: Four times a day (QID) | ORAL | Status: DC | PRN

## 2012-05-30 MED ORDER — CALCIUM CARBONATE 1250 (500 CA) MG PO TABS
1250.0000 mg | ORAL_TABLET | Freq: Three times a day (TID) | ORAL | Status: DC
  Filled 2012-05-30 (×3): qty 1

## 2012-05-30 MED ORDER — PHENYLEPHRINE HCL 10 MG/ML IJ SOLN
INTRAMUSCULAR | Status: DC | PRN
  Administered 2012-05-30 (×2): 20 ug via INTRAVENOUS

## 2012-05-30 MED ORDER — CONJ ESTROG-MEDROXYPROGEST ACE 0.3-1.5 MG PO TABS: 1.0000 | ORAL_TABLET | Freq: Every morning | ORAL | Status: DC

## 2012-05-30 MED ORDER — PROPOFOL 10 MG/ML IV EMUL
INTRAVENOUS | Status: DC | PRN
  Administered 2012-05-30: 150 mg via INTRAVENOUS

## 2012-05-30 MED ORDER — LIDOCAINE HCL (CARDIAC) 20 MG/ML IV SOLN
INTRAVENOUS | Status: DC | PRN
  Administered 2012-05-30: 20 mg via INTRAVENOUS

## 2012-05-30 MED ORDER — CISATRACURIUM BESYLATE (PF) 10 MG/5ML IV SOLN
INTRAVENOUS | Status: DC | PRN
  Administered 2012-05-30: 5 mg via INTRAVENOUS
  Administered 2012-05-30: 1 mg via INTRAVENOUS

## 2012-05-30 MED ORDER — DEXTROSE 5 % IV SOLN
10000.0000 ug | INTRAVENOUS | Status: DC | PRN
  Administered 2012-05-30: 20 ug/min via INTRAVENOUS

## 2012-05-30 MED ORDER — 0.9 % SODIUM CHLORIDE (POUR BTL) OPTIME
TOPICAL | Status: DC | PRN
  Administered 2012-05-30: 1000 mL

## 2012-05-30 MED ORDER — CALCIUM CARBONATE 1250 (500 CA) MG PO TABS
1.0000 | ORAL_TABLET | Freq: Three times a day (TID) | ORAL | Status: DC
  Administered 2012-05-30 – 2012-05-31 (×3): 500 mg via ORAL
  Filled 2012-05-30 (×7): qty 1

## 2012-05-30 MED ORDER — GLYCOPYRROLATE 0.2 MG/ML IJ SOLN
INTRAMUSCULAR | Status: DC | PRN
  Administered 2012-05-30: 0.4 mg via INTRAVENOUS

## 2012-05-30 MED ORDER — FENTANYL CITRATE 0.05 MG/ML IJ SOLN
INTRAMUSCULAR | Status: DC | PRN
  Administered 2012-05-30: 50 ug via INTRAVENOUS
  Administered 2012-05-30: 25 ug via INTRAVENOUS
  Administered 2012-05-30 (×2): 50 ug via INTRAVENOUS
  Administered 2012-05-30 (×3): 25 ug via INTRAVENOUS

## 2012-05-30 MED ORDER — FENTANYL CITRATE 0.05 MG/ML IJ SOLN: 25.0000 ug | INTRAMUSCULAR | Status: DC | PRN

## 2012-05-30 MED ORDER — LACTATED RINGERS IV SOLN
INTRAVENOUS | Status: DC | PRN
  Administered 2012-05-30: 07:00:00 via INTRAVENOUS

## 2012-05-30 MED ORDER — PROMETHAZINE HCL 25 MG/ML IJ SOLN
6.2500 mg | INTRAMUSCULAR | Status: DC | PRN
  Administered 2012-05-30: 6.25 mg via INTRAVENOUS

## 2012-05-30 MED ORDER — HYDROCODONE-ACETAMINOPHEN 5-325 MG PO TABS
1.0000 | ORAL_TABLET | ORAL | Status: DC | PRN
  Administered 2012-05-30 – 2012-05-31 (×4): 1 via ORAL
  Filled 2012-05-30 (×4): qty 1

## 2012-05-30 SURGICAL SUPPLY — 39 items
ATTRACTOMAT 16X20 MAGNETIC DRP (DRAPES) ×2 IMPLANT
BENZOIN TINCTURE PRP APPL 2/3 (GAUZE/BANDAGES/DRESSINGS) ×2 IMPLANT
BLADE HEX COATED 2.75 (ELECTRODE) ×2 IMPLANT
BLADE SURG 15 STRL LF DISP TIS (BLADE) ×1 IMPLANT
BLADE SURG 15 STRL SS (BLADE) ×1
CANISTER SUCTION 2500CC (MISCELLANEOUS) ×2 IMPLANT
CHLORAPREP W/TINT 10.5 ML (MISCELLANEOUS) ×4 IMPLANT
CLIP TI MEDIUM 6 (CLIP) ×8 IMPLANT
CLIP TI WIDE RED SMALL 6 (CLIP) ×8 IMPLANT
CLOTH BEACON ORANGE TIMEOUT ST (SAFETY) ×2 IMPLANT
DISSECTOR ROUND CHERRY 3/8 STR (MISCELLANEOUS) IMPLANT
DRAPE PED LAPAROTOMY (DRAPES) ×2 IMPLANT
DRAPE UTILITY 15X26 (DRAPE) ×2 IMPLANT
DRESSING SURGICEL FIBRLLR 1X2 (HEMOSTASIS) ×1 IMPLANT
DRSG SURGICEL FIBRILLAR 1X2 (HEMOSTASIS) ×2
ELECT REM PT RETURN 9FT ADLT (ELECTROSURGICAL) ×2
ELECTRODE REM PT RTRN 9FT ADLT (ELECTROSURGICAL) ×1 IMPLANT
GAUZE SPONGE 4X4 16PLY XRAY LF (GAUZE/BANDAGES/DRESSINGS) ×2 IMPLANT
GLOVE SURG ORTHO 8.0 STRL STRW (GLOVE) ×2 IMPLANT
GOWN STRL NON-REIN LRG LVL3 (GOWN DISPOSABLE) ×4 IMPLANT
GOWN STRL REIN XL XLG (GOWN DISPOSABLE) ×4 IMPLANT
KIT BASIN OR (CUSTOM PROCEDURE TRAY) ×2 IMPLANT
NS IRRIG 1000ML POUR BTL (IV SOLUTION) ×2 IMPLANT
PACK BASIC VI WITH GOWN DISP (CUSTOM PROCEDURE TRAY) ×2 IMPLANT
PENCIL BUTTON HOLSTER BLD 10FT (ELECTRODE) ×2 IMPLANT
SHEARS HARMONIC 9CM CVD (BLADE) ×2 IMPLANT
SPONGE GAUZE 4X4 12PLY (GAUZE/BANDAGES/DRESSINGS) ×2 IMPLANT
STAPLER VISISTAT 35W (STAPLE) ×2 IMPLANT
STRIP CLOSURE SKIN 1/2X4 (GAUZE/BANDAGES/DRESSINGS) ×2 IMPLANT
SUT MNCRL AB 4-0 PS2 18 (SUTURE) ×2 IMPLANT
SUT SILK 2 0 (SUTURE) ×1
SUT SILK 2-0 18XBRD TIE 12 (SUTURE) ×1 IMPLANT
SUT SILK 3 0 (SUTURE)
SUT SILK 3-0 18XBRD TIE 12 (SUTURE) IMPLANT
SUT VIC AB 3-0 SH 18 (SUTURE) ×4 IMPLANT
SYR BULB IRRIGATION 50ML (SYRINGE) ×2 IMPLANT
TAPE CLOTH SOFT 2X10 (GAUZE/BANDAGES/DRESSINGS) ×2 IMPLANT
TOWEL OR 17X26 10 PK STRL BLUE (TOWEL DISPOSABLE) ×2 IMPLANT
YANKAUER SUCT BULB TIP 10FT TU (MISCELLANEOUS) ×2 IMPLANT

## 2012-05-30 NOTE — Telephone Encounter (Signed)
Pts daughter advised of rx to be called in by Dr Collins Scotland and she is given phone # to call to verify med and lab orders with Dr Collins Scotland.

## 2012-05-30 NOTE — Op Note (Signed)
NAMEBRIGGETTE, Lorraine Kemp                 ACCOUNT NO.:  [id number]  MEDICAL RECORD NO.:  [id number]  LOCATION:  [facility]                         FACILITY:  [facility]  PHYSICIAN:  Velora Heckler, MD      DATE OF BIRTH:  30-Oct-1950  DATE OF PROCEDURE:  05/30/2012                               OPERATIVE REPORT   PREOPERATIVE DIAGNOSIS:  Thyroid nodules with cytologic atypia.  POSTOPERATIVE DIAGNOSIS:  Thyroid nodules with cytologic atypia.  PROCEDURE:  Total thyroidectomy.  SURGEON:  Velora Heckler, MD, FACS  ASSISTANT:  Ollen Gross. Vernell Morgans, MD, FACS  ANESTHESIA:  General per Dr. Phillips Grout.  ESTIMATED BLOOD LOSS:  Minimal.  PREPARATION:  ChloraPrep.  COMPLICATIONS:  None.  INDICATIONS:  The patient is a 62 year old white female referred by her cardiologist for newly-diagnosed thyroid nodules.  The patient had undergone cardiac evaluation including carotid duplex exam.  Incidental finding of thyroid nodules was noted.  The patient underwent thyroid ultrasound in December 2013 showing multiple bilateral thyroid nodules. The dominant nodule in the left lobe measured 12 mm and contained microcalcifications.  Fine-needle aspiration showed cytologic atypia with Hurthle cell change.  The patient now comes to surgery for thyroidectomy for definitive diagnosis.  BODY OF REPORT:  Procedure was done in OR #1 at the [facility] ([place]  The patient was brought to the operating room, placed in a supine position on the operating room table.  Following administration of general anesthesia, the patient was prepped and draped in the usual aseptic fashion.  After ascertaining that an adequate level of anesthesia had been achieved, a Kocher incision was made with a #15 blade.  Dissection was carried through subcutaneous tissues and platysma.  Hemostasis was obtained with electrocautery.  Skin flaps were elevated cephalad and caudad from the thyroid notch to the sternal notch.  A Mahorner  self-retaining retractor was placed for exposure. Strap muscles were incised in the midline and dissection was begun on the left.  Left thyroid lobe was gently mobilized.  Middle thyroid vein was divided between Ligaclips with Harmonic scalpel.  Superior pole vessels were dissected out and divided individually between small and medium Ligaclips with the Harmonic scalpel.  Parathyroid tissue was identified and preserved.  Inferior venous tributaries were divided between Ligaclips with the Harmonic scalpel.  Gland was rolled anteriorly.  Branches of the inferior thyroid artery were divided individually between small Ligaclips with the Harmonic scalpel. Ligament of Allyson Sabal was released with electrocautery and the gland was mobilized onto the anterior trachea.  Isthmus was mobilized across the midline with the electrocautery.  A moderate-sized pyramidal lobe was dissected off the thyroid cartilage and included with the isthmus in the resection.  A dry pack was placed in the left neck.  Next, we turned our attention to the right side.  Right lobe was slightly larger.  It was multinodular.  It was gently mobilized with blunt dissection.  Venous tributaries were divided between Ligaclips with the Harmonic scalpel.  Superior pole vessels were divided individually between small and medium Ligaclips with the Harmonic scalpel.  Parathyroid tissue was identified and preserved.  There was a cystic mass in the tubercle of sucker  candle.  This lies adjacent to the recurrent nerve.  With gentle meticulous dissection, the nerve was mobilized away from the cyst and the cyst was resected with the right thyroid lobe.  Ligament of Allyson Sabal was released and the gland was mobilized onto the anterior trachea.  Inferior venous tributaries were divided between Ligaclips with the Harmonic scalpel and the entire thyroid gland was removed.  Suture was used to mark the left superior pole.  The entire thyroid was submitted  to Pathology for review.  The neck was irrigated with warm saline.  Good hemostasis was achieved bilaterally.  Fibrillar was placed throughout the operative field. Strap muscles were reapproximated in the midline with interrupted 3-0 Vicryl sutures.  Platysma was closed with interrupted 3-0 Vicryl sutures.  Skin was closed with a running 4-0 Monocryl subcuticular suture.  Wound was washed and dried and benzoin and Steri-Strips were applied.  Sterile dressings were applied.  The patient was awakened from anesthesia and brought to the recovery room.  The patient tolerated the procedure well.   Velora Heckler, MD, FACS General & Endocrine Surgery [facility] Office: [phone]  TMG/MEDQ  D:  05/30/2012  T:  05/30/2012  Job:  [id number]  cc:   Madolyn Frieze. Jens Som, MD, [facility]  Tammy R. Collins Scotland, M.D. Fax: [phone]

## 2012-05-30 NOTE — Brief Op Note (Signed)
05/30/2012  9:14 AM  PATIENT:  Lorraine Kemp  62 y.o. female  PRE-OPERATIVE DIAGNOSIS:  multinodular thyroid with atypia   POST-OPERATIVE DIAGNOSIS:  Same   PROCEDURE:  Procedure(s) (LRB) with comments: THYROIDECTOMY (N/A) - total thyroidectomy  SURGEON:  Surgeon(s) and Role:    * Velora Heckler, MD - Primary    * Robyne Askew, MD - Assisting  ANESTHESIA:   general  EBL:  Total I/O In: 500 [I.V.:500] Out: 5 [Blood:5]  BLOOD ADMINISTERED:none  DRAINS: none   LOCAL MEDICATIONS USED:  NONE  SPECIMEN:  Excision  DISPOSITION OF SPECIMEN:  PATHOLOGY  COUNTS:  YES  TOURNIQUET:  * No tourniquets in log *  DICTATION: .Other Dictation: Dictation Number [id number]  PLAN OF CARE: Admit for overnight observation  PATIENT DISPOSITION:  PACU - hemodynamically stable.   Delay start of Pharmacological VTE agent (>24hrs) due to surgical blood loss or risk of bleeding: yes  Velora Heckler, MD, FACS General & Endocrine Surgery [facility] Office: [phone]

## 2012-05-30 NOTE — Anesthesia Postprocedure Evaluation (Signed)
  Anesthesia Post-op Note  Patient: Lorraine Kemp  Procedure(s) Performed: Procedure(s) (LRB): THYROIDECTOMY (N/A)  Patient Location: PACU  Anesthesia Type: General  Level of Consciousness: awake and alert   Airway and Oxygen Therapy: Patient Spontanous Breathing  Post-op Pain: mild  Post-op Assessment: Post-op Vital signs reviewed, Patient's Cardiovascular Status Stable, Respiratory Function Stable, Patent Airway and No signs of Nausea or vomiting  Last Vitals:  Filed Vitals:   05/30/12 1013  BP: 112/51  Pulse: 73  Temp: 36.4 C  Resp: 12    Post-op Vital Signs: stable   Complications: No apparent anesthesia complications

## 2012-05-30 NOTE — Transfer of Care (Signed)
Immediate Anesthesia Transfer of Care Note  Patient: Lorraine Kemp  Procedure(s) Performed: Procedure(s) (LRB) with comments: THYROIDECTOMY (N/A) - total thyroidectomy  Patient Location: PACU  Anesthesia Type:General  Level of Consciousness: awake, oriented and patient cooperative  Airway & Oxygen Therapy: Patient Spontanous Breathing and Patient connected to face mask oxygen  Post-op Assessment: Report given to PACU RN  Post vital signs: Reviewed and stable  Complications: No apparent anesthesia complications

## 2012-05-30 NOTE — Interval H&P Note (Signed)
History and Physical Interval Note:  05/30/2012 7:20 AM  Lorraine Kemp  has presented today for surgery, with the diagnosis of multinoduler thyroid with atypia.  The various methods of treatment have been discussed with the patient and family. After consideration of risks, benefits and other options for treatment, the patient has consented to    Procedure(s) (LRB) with comments: THYROIDECTOMY (N/A) as a surgical intervention .    The patient's history has been reviewed, patient examined, no change in status, stable for surgery.  I have reviewed the patient's chart and labs.  Questions were answered to the patient's satisfaction.    Velora Heckler, MD, FACS General & Endocrine Surgery [facility] Office: [phone]  Hope Brandenburger Judie Petit

## 2012-05-30 NOTE — Anesthesia Preprocedure Evaluation (Addendum)
Anesthesia Evaluation  Patient identified by MRN, date of birth, ID band Patient awake    Reviewed: Allergy & Precautions, H&P , NPO status , Patient's Chart, lab work & pertinent test results  Airway Mallampati: II TM Distance: >3 FB Neck ROM: Full    Dental No notable dental hx.    Pulmonary neg pulmonary ROS,  breath sounds clear to auscultation  Pulmonary exam normal       Cardiovascular + dysrhythmias Atrial Fibrillation Rhythm:Regular Rate:Normal     Neuro/Psych negative neurological ROS  negative psych ROS   GI/Hepatic negative GI ROS, Neg liver ROS,   Endo/Other  negative endocrine ROS  Renal/GU negative Renal ROS  negative genitourinary   Musculoskeletal negative musculoskeletal ROS (+)   Abdominal   Peds negative pediatric ROS (+)  Hematology negative hematology ROS (+)   Anesthesia Other Findings   Reproductive/Obstetrics negative OB ROS                          Anesthesia Physical Anesthesia Plan  ASA: II  Anesthesia Plan: General   Post-op Pain Management:    Induction: Intravenous  Airway Management Planned: Oral ETT  Additional Equipment:   Intra-op Plan:   Post-operative Plan: Extubation in OR  Informed Consent: I have reviewed the patients History and Physical, chart, labs and discussed the procedure including the risks, benefits and alternatives for the proposed anesthesia with the patient or authorized representative who has indicated his/her understanding and acceptance.   Dental advisory given  Plan Discussed with: CRNA  Anesthesia Plan Comments:         Anesthesia Quick Evaluation

## 2012-05-30 NOTE — Telephone Encounter (Signed)
Per Dr Ardine Eng request I spoke with Carisma at Dr Alda Berthold office. She will call in refill lovenox and send request to Dr Collins Scotland for labs to be done early next week. Carisma to call back with any questions.

## 2012-05-30 NOTE — H&P (View-Only) (Signed)
General Surgery - [facility]  Chief Complaint  Patient presents with  . New Evaluation    new pt - eval thyroid nodules with atypia - referral from Dr. Brian Kemp, [facility]    HISTORY: Patient is a very pleasant 61-year-old white female referred by her cardiologist for a newly identified thyroid nodules. Patient had been undergoing evaluation for dizziness. This included a carotid duplex. Incidental finding of thyroid nodules was noted. Patient underwent a subsequent thyroid ultrasound in December 2013. This showed bilateral thyroid nodules in an otherwise normal sized thyroid gland. There was a dominant nodule in the left lobe mid section measuring 12 mm with microcalcifications. 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 A second 2 cm nodule in the lower left lobe showed benign follicular changes on needle aspiration. Patient is now referred for consideration for thyroidectomy for definitive diagnosis and management.  Patient has no prior history of thyroid disease. She has never been on thyroid medication. She has had no prior head or neck surgery. There is no family history of thyroid disease and specifically no history of thyroid cancer. There is no family history of other endocrinopathy.  Past Medical History  Diagnosis Date  . Mitral stenosis 08/26/2008  . MITRAL REGURGITATION 08/26/2008  . RHEUMATIC HEART DISEASE 08/26/2008  . Atrial fibrillation 08/26/2008     Current Outpatient Prescriptions  Medication Sig Dispense Refill  . buPROPion (BUDEPRION XL) 300 MG 24 hr tablet Take 300 mg by mouth daily.        . calcium carbonate (OS-CAL) 600 MG TABS Take 600 mg by mouth daily.       . diltiazem (CARDIZEM CD) 240 MG 24 hr capsule TAKE 1 CAPSULE DAILY  90 capsule  3  . estrogen, conjugated,-medroxyprogesterone (PREMPRO) 0.3-1.5 MG per tablet Take 1 tablet by mouth daily.        . Multiple Vitamins-Minerals (MULTIVITAMIN WITH  MINERALS) tablet Take 1 tablet by mouth daily.        . Omega-3 Fatty Acids (FISH OIL) 1000 MG CAPS Take by mouth.        . Vitamin D, Ergocalciferol, (DRISDOL) 50000 UNITS CAPS Take 50,000 Units by mouth every 7 (seven) days.        . warfarin (COUMADIN) 2.5 MG tablet Take 2.5 mg by mouth daily. 1 tablet or 1/2 tablet          No Known Allergies   Family History  Problem Relation Age of Onset  . Lung cancer Father   . Cancer Father     lung  . Heart disease Mother     S/P CABG  . Cancer Brother     brain  . Heart disease Maternal Grandmother      History   Social History  . Marital Status: Married    Spouse Name: N/A    Number of Children: 2  . Years of Education: N/A   Occupational History  . branch office administrator    Social History Main Topics  . Smoking status: Never Smoker   . Smokeless tobacco: Never Used  . Alcohol Use: Yes     Comment: occas glass of wine  . Drug Use: No  . Sexually Active: None   Other Topics Concern  . None   Social History Narrative  . None     REVIEW OF SYSTEMS - PERTINENT POSITIVES ONLY: No compressive symptoms. Denies tremor. Denies palpitations.  EXAM: Filed Vitals:   05/14/12 1004  BP: 124/86    Pulse: 80  Temp: 97.5 F (36.4 C)  Resp: 16    HEENT: normocephalic; pupils equal and reactive; sclerae clear; dentition good; mucous membranes moist NECK:  No palpable abnormality in the thyroid bed; symmetric on extension; no palpable anterior or posterior cervical lymphadenopathy; no supraclavicular masses; no tenderness CHEST: clear to auscultation bilaterally without rales, rhonchi, or wheezes CARDIAC: regular rate and rhythm without significant murmur; peripheral pulses are full EXT:  non-tender without edema; no deformity NEURO: no gross focal deficits; no sign of tremor   LABORATORY RESULTS: See [facility] (CHL-Epic) for most recent results   RADIOLOGY RESULTS: See [facility] (CHL-Epic) for most  recent results   IMPRESSION: #1 multinodular thyroid #2 left thyroid nodule, 12 mm, with microcalcifications and cytologic atypia #3 atrial fibrillation on chronic anticoagulation  PLAN: I had a lengthy discussion today with the patient and her husband. I provided them with written literature to review. We discussed the findings of bilateral thyroid nodules and the dominant nodule on the left with findings worrisome for possible malignancy. I believe her risk of malignancy is approximately 25-30%.  I have recommended total thyroidectomy for definitive diagnosis and management. We have discussed the possibility of radioactive iodine treatment in the event of malignancy.  We discussed the procedure of total thyroidectomy at length. We discussed the hospital stay and the postoperative recovery to be anticipated. We discussed the location of the surgical incision and cosmetic results to be anticipated. We discussed potential risk of the procedure including recurrent laryngeal nerve injury and injury to parathyroid glands. We discussed the need for lifelong thyroid hormone replacement following thyroidectomy. They understand and wish to proceed with surgery in the near future.  The risks and benefits of the procedure have been discussed at length with the patient.  The patient understands the proposed procedure, potential alternative treatments, and the course of recovery to be expected.  All of the patient's questions have been answered at this time.  The patient wishes to proceed with surgery.  Lorraine Kemp M. [name], MD, FACS General & Endocrine Surgery [facility]   Visit Diagnoses: 1. Neoplasm of uncertain behavior of thyroid gland     Primary Care Physician: SPEAR, TAMMY, MD   

## 2012-05-31 LAB — BASIC METABOLIC PANEL
CO2: 27 mEq/L (ref 19–32)
Chloride: 103 mEq/L (ref 96–112)
Creatinine, Ser: 0.73 mg/dL (ref 0.50–1.10)
Glucose, Bld: 113 mg/dL — ABNORMAL HIGH (ref 70–99)

## 2012-05-31 MED ORDER — LEVOTHYROXINE SODIUM 100 MCG PO TABS: 100.0000 ug | ORAL_TABLET | Freq: Every day | ORAL | Status: DC

## 2012-05-31 MED ORDER — HYDROCODONE-ACETAMINOPHEN 5-325 MG PO TABS: 1.0000 | ORAL_TABLET | ORAL | Status: DC | PRN

## 2012-05-31 NOTE — Discharge Summary (Signed)
Physician Discharge Summary  Patient ID: Lorraine Kemp MRN: [id number] DOB/AGE: 05-22-1950 62 y.o.  Admit date: 05/30/2012 Discharge date: 05/31/2012  Admission Diagnoses: Principal Problem:  *Neoplasm of uncertain behavior of thyroid gland Active Problems:  Hypothyroidism, postsurgical  Discharge Diagnoses:  Principal Problem:  *Neoplasm of uncertain behavior of thyroid gland Active Problems:  Hypothyroidism, postsurgical   Discharged Condition: good  Hospital Course: Pt underwent thyroidectomy.  Postoperatively, the patient  mobilized and advanced to a solid diet gradually.  Pain was well-controlled and transitioned off IV medications.    By the time of discharge, the patient was walking well the hallways, eating food well, having flatus.  Pain was-controlled on an oral regimen.  Based on meeting DC criteria and recovering well, I felt it was safe for the patient to be discharged home with close followup.  Instructions were discussed in detail.  They are written as well.    Consults: None  Significant Diagnostic Studies:   Results for orders placed during the hospital encounter of 05/30/12 (from the past 72 hour(s))  PROTIME-INR     Status: Normal   Collection Time   05/30/12  5:45 AM      Component Value Range Comment   Prothrombin Time 13.4  11.6 - 15.2 seconds    INR 1.03  0.00 - 1.49   BASIC METABOLIC PANEL     Status: Abnormal   Collection Time   05/31/12  4:20 AM      Component Value Range Comment   Sodium 137  135 - 145 mEq/L    Potassium 3.4 (*) 3.5 - 5.1 mEq/L    Chloride 103  96 - 112 mEq/L    CO2 27  19 - 32 mEq/L    Glucose, Bld 113 (*) 70 - 99 mg/dL    BUN 6  6 - 23 mg/dL    Creatinine, Ser 7.82  0.50 - 1.10 mg/dL    Calcium 8.9  8.4 - 95.6 mg/dL    GFR calc non Af Amer 89 (*) >90 mL/min    GFR calc Af Amer >90  >90 mL/min     Treatments: surgery: thyroidectomy  Discharge Exam: Blood pressure 123/69, pulse 77, temperature 97.6 F (36.4 C),  temperature source Oral, resp. rate 18, height 5\' 5"  (1.651 m), weight 160 lb (72.576 kg), SpO2 94.00%.  General: Pt awake/alert/oriented x4 in no major acute distress Eyes: PERRL, normal EOM. Sclera nonicteric Neuro: CN II-XII intact w/o focal sensory/motor deficits. Lymph: No head/neck/groin lymphadenopathy Psych:  No delerium/psychosis/paranoia HENT: Normocephalic, Mucus membranes moist.  No thrush.  No horseness Neck: Dressing clean.  I removed.  Steristrips in place.  No ecchymosis/swelling/heatoma.  Supple, No tracheal deviation Chest: No pain.  Good respiratory excursion. CV:  Pulses intact.  Regular rhythm MS: Normal AROM mjr joints.  No obvious deformity Abdomen: Soft, Nondistended.  Nontender.  No incarcerated hernias. Ext:  SCDs BLE.  No significant edema.  No cyanosis Skin: No petechiae / purpurae  Disposition: 01-Home or Self Care  Discharge Orders    Future Orders Please Complete By Expires   Diet - low sodium heart healthy      Increase activity slowly          Medication List     As of 05/31/2012  2:05 PM    TAKE these medications         acetaminophen 500 MG tablet   Commonly known as: TYLENOL   Take 500 mg by mouth every 6 (six) hours as  needed. For pain      BUDEPRION XL 300 MG 24 hr tablet   Generic drug: buPROPion   Take 300 mg by mouth every morning.      calcium carbonate 600 MG Tabs   Commonly known as: OS-CAL   Take 600 mg by mouth daily.      diltiazem 240 MG 24 hr capsule   Commonly known as: CARDIZEM CD   Take 240 mg by mouth every morning.      estrogen (conjugated)-medroxyprogesterone 0.3-1.5 MG per tablet   Commonly known as: PREMPRO   Take 1 tablet by mouth every morning.      Fish Oil 1000 MG Caps   Take 1 capsule by mouth daily.      HYDROcodone-acetaminophen 5-325 MG per tablet   Commonly known as: NORCO/VICODIN   Take 1-2 tablets by mouth every 4 (four) hours as needed.      levothyroxine 100 MCG tablet   Commonly known as:  SYNTHROID, LEVOTHROID   Take 1 tablet (100 mcg total) by mouth daily.      multivitamin with minerals tablet   Take 1 tablet by mouth daily.      Vitamin D (Ergocalciferol) 50000 UNITS Caps   Commonly known as: DRISDOL   Take 50,000 Units by mouth every Monday.      warfarin 2.5 MG tablet   Commonly known as: COUMADIN   Take 2.5 mg by mouth daily.           Follow-up Information    Follow up with Velora Heckler, MD. Schedule an appointment as soon as possible for a visit in 2 weeks.   Contact information:   [address] [place] [address] [phone]          Signed: Ardeth Sportsman. 05/31/2012, 2:05 PM

## 2012-06-02 ENCOUNTER — Encounter (HOSPITAL_COMMUNITY): Payer: Self-pay | Admitting: Surgery

## 2012-06-03 ENCOUNTER — Telehealth (INDEPENDENT_AMBULATORY_CARE_PROVIDER_SITE_OTHER): Payer: Self-pay

## 2012-06-03 NOTE — Telephone Encounter (Signed)
Pt notified of path result per Dr Gerkin's request. 

## 2012-06-03 NOTE — Telephone Encounter (Signed)
Message copied by Joanette Gula on Tue Jun 03, 2012  1:14 PM ------      Message from: Velora Heckler      Created: Tue Jun 03, 2012 10:38 AM       Please contact patient and notify of benign pathology results.            Velora Heckler, MD, [facility]      Office: [phone]

## 2012-06-03 NOTE — Telephone Encounter (Signed)
Pt home doing well. PO appt made. Lab slip for tsh at [facility] mailed to pt.

## 2012-06-03 NOTE — Progress Notes (Signed)
Quick Note:  Please contact patient and notify of benign pathology results.  Lorraine Kemp [name], MD, FACS [facility] Office: [phone]   ______ 

## 2012-06-04 ENCOUNTER — Telehealth (INDEPENDENT_AMBULATORY_CARE_PROVIDER_SITE_OTHER): Payer: Self-pay

## 2012-06-04 NOTE — Telephone Encounter (Signed)
Patient calling in for pathology results.  Patient made aware of Benign Pathology results.

## 2012-06-18 ENCOUNTER — Ambulatory Visit (INDEPENDENT_AMBULATORY_CARE_PROVIDER_SITE_OTHER): Payer: BC Managed Care – PPO | Admitting: Surgery

## 2012-06-18 ENCOUNTER — Encounter (INDEPENDENT_AMBULATORY_CARE_PROVIDER_SITE_OTHER): Payer: Self-pay | Admitting: Surgery

## 2012-06-18 VITALS — BP 110/68 | HR 88 | Temp 97.3°F | Resp 18 | Ht 65.0 in | Wt 158.8 lb

## 2012-06-18 DIAGNOSIS — D449 Neoplasm of uncertain behavior of unspecified endocrine gland: Secondary | ICD-10-CM

## 2012-06-18 DIAGNOSIS — E890 Postprocedural hypothyroidism: Secondary | ICD-10-CM

## 2012-06-18 DIAGNOSIS — D440 Neoplasm of uncertain behavior of thyroid gland: Secondary | ICD-10-CM

## 2012-06-18 NOTE — Patient Instructions (Signed)
  COCOA BUTTER & VITAMIN E CREAM  ([employer] or other brand)  Apply cocoa butter/vitamin E cream to your incision 2 - 3 times daily.  Massage cream into incision for one minute with each application.  Use sunscreen (50 SPF or higher) for first 6 months after surgery if area is exposed to sun.  You may substitute Mederma or other scar reducing creams as desired.   

## 2012-06-18 NOTE — Progress Notes (Signed)
General Surgery [facility]  Visit Diagnoses: 1. Neoplasm of uncertain behavior of thyroid gland   2. Hypothyroidism, postsurgical     HISTORY: Patient returns for first postoperative visit having undergone total thyroidectomy on 05/30/2012. Final pathology shows adenomatoid nodules and nodular hyperplasia. There was no evidence of malignancy. Postoperative serum calcium level remains normal at 9.8. Patient is taking levothyroxine 100 mcg daily.  EXAM: Surgical wound is healing nicely. Mild soft tissue swelling. Voice quality is normal. No sign of infection. No sign of seroma.  IMPRESSION: Status post total thyroidectomy with benign final pathology  PLAN: Patient will begin applying topical creams to her incision. She will remain on levothyroxine 100 mcg daily. We will check a TSH level in one month. She will return to see me for final wound check in 6 weeks.  Velora Heckler, MD, FACS General & Endocrine Surgery [facility]

## 2012-06-21 ENCOUNTER — Other Ambulatory Visit: Payer: Self-pay

## 2012-06-24 ENCOUNTER — Encounter (INDEPENDENT_AMBULATORY_CARE_PROVIDER_SITE_OTHER): Payer: BC Managed Care – PPO | Admitting: Surgery

## 2012-07-28 ENCOUNTER — Encounter (INDEPENDENT_AMBULATORY_CARE_PROVIDER_SITE_OTHER): Payer: Self-pay

## 2012-08-06 ENCOUNTER — Encounter (INDEPENDENT_AMBULATORY_CARE_PROVIDER_SITE_OTHER): Payer: Self-pay | Admitting: Surgery

## 2012-08-06 ENCOUNTER — Ambulatory Visit (INDEPENDENT_AMBULATORY_CARE_PROVIDER_SITE_OTHER): Payer: BC Managed Care – PPO | Admitting: Surgery

## 2012-08-06 VITALS — BP 126/86 | HR 84 | Temp 98.3°F | Resp 16 | Ht 65.0 in | Wt 160.0 lb

## 2012-08-06 DIAGNOSIS — D449 Neoplasm of uncertain behavior of unspecified endocrine gland: Secondary | ICD-10-CM

## 2012-08-06 DIAGNOSIS — D440 Neoplasm of uncertain behavior of thyroid gland: Secondary | ICD-10-CM

## 2012-08-06 DIAGNOSIS — E890 Postprocedural hypothyroidism: Secondary | ICD-10-CM

## 2012-08-06 NOTE — Patient Instructions (Signed)
  COCOA BUTTER & VITAMIN E CREAM  ([employer] or other brand)  Apply cocoa butter/vitamin E cream to your incision 2 - 3 times daily.  Massage cream into incision for one minute with each application.  Use sunscreen (50 SPF or higher) for first 6 months after surgery if area is exposed to sun.  You may substitute Mederma or other scar reducing creams as desired.   

## 2012-08-06 NOTE — Progress Notes (Signed)
General Surgery [facility]  Visit Diagnoses: 1. Neoplasm of uncertain behavior of thyroid gland   2. Hypothyroidism, postsurgical     HISTORY: The patient returns for her second postoperative visit having undergone total thyroidectomy in January. Final pathology was benign. She is taking Synthroid 100 mcg daily. TSH level is normal at 2.7.  EXAM: Surgical incision is healing nicely with a good cosmetic result. Voice quality is now normal. Patient shows no seroma.  IMPRESSION: Status post total thyroidectomy for benign disease  PLAN: Patient will continue applying topical creams to her incision. I've asked her to use sunscreen during the summer months this year. She will follow-up with her primary care physician. She will return as needed.  Velora Heckler, MD, FACS General & Endocrine Surgery [facility]

## 2013-01-23 ENCOUNTER — Other Ambulatory Visit: Payer: Self-pay | Admitting: Cardiology

## 2013-01-25 ENCOUNTER — Other Ambulatory Visit: Payer: Self-pay | Admitting: Nurse Practitioner

## 2013-01-26 NOTE — Telephone Encounter (Signed)
Patient has AEX scheduled for 03/17/13. Please approve if ok to give patient a 90 day supply. Chart is on your desk.

## 2013-03-10 ENCOUNTER — Other Ambulatory Visit: Payer: Self-pay

## 2013-03-10 DIAGNOSIS — Z1231 Encounter for screening mammogram for malignant neoplasm of breast: Secondary | ICD-10-CM

## 2013-03-12 ENCOUNTER — Other Ambulatory Visit: Payer: Self-pay

## 2013-03-17 ENCOUNTER — Ambulatory Visit: Payer: Self-pay | Admitting: Nurse Practitioner

## 2013-03-27 ENCOUNTER — Encounter: Payer: Self-pay | Admitting: Nurse Practitioner

## 2013-03-30 ENCOUNTER — Encounter: Payer: Self-pay | Admitting: Nurse Practitioner

## 2013-03-30 ENCOUNTER — Ambulatory Visit (INDEPENDENT_AMBULATORY_CARE_PROVIDER_SITE_OTHER): Payer: BC Managed Care – PPO | Admitting: Nurse Practitioner

## 2013-03-30 VITALS — BP 140/80 | HR 68 | Resp 20 | Ht 65.0 in | Wt 167.0 lb

## 2013-03-30 DIAGNOSIS — Z0000 Encounter for general adult medical examination without abnormal findings: Secondary | ICD-10-CM

## 2013-03-30 DIAGNOSIS — Z01419 Encounter for gynecological examination (general) (routine) without abnormal findings: Secondary | ICD-10-CM

## 2013-03-30 DIAGNOSIS — E559 Vitamin D deficiency, unspecified: Secondary | ICD-10-CM

## 2013-03-30 LAB — POCT URINALYSIS DIPSTICK
Bilirubin, UA: NEGATIVE
Glucose, UA: NEGATIVE
Ketones, UA: NEGATIVE
Leukocytes, UA: NEGATIVE
Protein, UA: NEGATIVE

## 2013-03-30 LAB — HEMOGLOBIN, FINGERSTICK: Hemoglobin, fingerstick: 12.6 g/dL (ref 12.0–16.0)

## 2013-03-30 MED ORDER — BUPROPION HCL ER (XL) 300 MG PO TB24: ORAL_TABLET | ORAL | Status: DC

## 2013-03-30 MED ORDER — VITAMIN D (ERGOCALCIFEROL) 1.25 MG (50000 UNIT) PO CAPS: 50000.0000 [IU] | ORAL_CAPSULE | ORAL | Status: DC

## 2013-03-30 NOTE — Patient Instructions (Signed)

## 2013-03-30 NOTE — Progress Notes (Signed)
62 y.o. [id number] Married Caucasian Fe here for annual exam.   She desires to taper off HRT this late to early part of next year about when she retires.  She had such bad vaso symptoms the last time along with brain fog.  She has enough med's she thinks to do during the taper.  If not to let us know.  Patient's last menstrual period was 05/07/2002.          Sexually active: yes  The current method of family planning is tubal ligation.    Exercising: no  not enough Smoker:  no  Health Maintenance: Pap:  03/12/12 WNL History of abnormal Pap:  no MMG:  04/11/12 normal Colonoscopy:  12/12 repeat in 10 years BMD:   7/09 TDaP:  12/16/07 Screening Labs:  Hb today: 12.6, Urine today: RBC-trace   reports that she has never smoked. She has never used smokeless tobacco. She reports that she drinks alcohol. She reports that she does not use illicit drugs.  Past Medical History  Diagnosis Date  . Mitral stenosis 08/26/2008  . MITRAL REGURGITATION 08/26/2008  . RHEUMATIC HEART DISEASE 08/26/2008  . Atrial fibrillation 08/26/2008  . Depression   . Hematuria     workup by Dr. Retta Diones  . Thyroid nodule     Past Surgical History  Procedure Laterality Date  . Percutaneous balloon valvuloplasty  10/1998    [facility]  . Cesarean section    . Tonsillectomy  1956  . Screw in ankles  1989    right  . Colonoscopy    . Cardioversion      2000  . Thyroidectomy  05/30/2012    Procedure: THYROIDECTOMY;  Surgeon: Velora Heckler, MD;  Location: [facility];  Service: General;  Laterality: N/A;  total thyroidectomy  . Tubal ligation      Current Outpatient Prescriptions  Medication Sig Dispense Refill  . buPROPion (WELLBUTRIN XL) 300 MG 24 hr tablet TAKE 1 TABLET DAILY  90 tablet  3  . calcium carbonate (OS-CAL) 600 MG TABS Take 600 mg by mouth daily.       Marland Kitchen diltiazem (CARDIZEM CD) 240 MG 24 hr capsule TAKE 1 CAPSULE DAILY  90 capsule  0  . estrogen, conjugated,-medroxyprogesterone (PREMPRO) 0.3-1.5 MG  per tablet Take 1 tablet by mouth every morning.       Marland Kitchen levothyroxine (SYNTHROID, LEVOTHROID) 125 MCG tablet Take 125 mcg by mouth daily before breakfast.      . Multiple Vitamins-Minerals (MULTIVITAMIN WITH MINERALS) tablet Take 1 tablet by mouth daily.       . Omega-3 Fatty Acids (FISH OIL) 1000 MG CAPS Take 1 capsule by mouth daily.       . Vitamin D, Ergocalciferol, (DRISDOL) 50000 UNITS CAPS capsule Take 1 capsule (50,000 Units total) by mouth every Monday.  30 capsule  3  . warfarin (COUMADIN) 2.5 MG tablet Take 2.5 mg by mouth daily.        No current facility-administered medications for this visit.    Family History  Problem Relation Age of Onset  . Lung cancer Father   . Cancer Father     lung  . Heart disease Mother     S/P CABG  . Cancer Brother     brain  . Heart disease Maternal Grandmother   . Hypertension Sister     ROS:  Pertinent items are noted in HPI.  Otherwise, a comprehensive ROS was negative.  Exam:   BP 140/80  Pulse 68  Resp 20  Ht 5\' 5"  (1.651 m)  Wt 167 lb (75.751 kg)  BMI 27.79 kg/m2  LMP 05/07/2002  Weight change: @WEIGHT  CHANGE@ Height:   Height: 5\' 5"  (165.1 cm)  Ht Readings from Last 3 Encounters:  03/30/13 5\' 5"  (1.651 m)  08/06/12 5\' 5"  (1.651 m)  06/18/12 5\' 5"  (1.651 m)    General appearance: alert, cooperative and appears stated age Head: Normocephalic, without obvious abnormality, atraumatic Neck: no adenopathy, supple, symmetrical, trachea midline and thyroid normal to inspection and palpation Lungs: clear to auscultation bilaterally Breasts: normal appearance, no masses or tenderness Heart: regular rate and rhythm Abdomen: soft, non-tender; bowel sounds normal; no masses,  no organomegaly Extremities: extremities normal, atraumatic, no cyanosis or edema Skin: Skin color, texture, turgor normal. No rashes or lesions Lymph nodes: Cervical, supraclavicular, and axillary nodes normal. No abnormal inguinal nodes  palpated Neurologic: Grossly normal   Pelvic: External genitalia:  no lesions              Urethra:  normal appearing urethra with no masses, tenderness or lesions              Bartholin's and Skene's: normal                 Vagina: normal appearing vagina with normal color and discharge, no lesions              Cervix: anteverted              Pap taken: yes Bimanual Exam:  Uterus:  normal size, contour, position, consistency, mobility, non-tender              Adnexa: no mass, fullness, tenderness               Rectovaginal: Confirms               Anus:  normal sphincter tone, no lesions  A:  Well Woman with normal exam  History of Mitral valve Stenosis  Postmenopausal on HRT - but will be tapering soon - she is aware that she can not take HRT if comes off Coumadin.   Situational stress  Vit D deficiency   P:   Pap is done today  Mammogram Is due 12/14  Refill on Wellbutrin and Vit D for a year  Reviewed potential risk including DVT, CVA, cancer, etc. - PCP is aware of her being on HRT and OK as long as she is on Coumadin.  Counseled on breast self exam, adequate intake of calcium and vitamin D, diet and exercise return annually or prn  An After Visit Summary was printed and given to the patient.

## 2013-03-31 LAB — VITAMIN D 25 HYDROXY (VIT D DEFICIENCY, FRACTURES): Vit D, 25-Hydroxy: 52 ng/mL (ref 30–89)

## 2013-04-01 NOTE — Progress Notes (Signed)
Encounter reviewed by Dr. Brook Silva.  

## 2013-04-03 LAB — IPS PAP TEST WITH HPV

## 2013-04-13 ENCOUNTER — Ambulatory Visit
Admission: RE | Admit: 2013-04-13 | Discharge: 2013-04-13 | Disposition: A | Discharge status: Home / Self Care | Payer: BC Managed Care – PPO | Source: Ambulatory Visit

## 2013-04-13 ENCOUNTER — Ambulatory Visit (INDEPENDENT_AMBULATORY_CARE_PROVIDER_SITE_OTHER): Payer: BC Managed Care – PPO | Admitting: Cardiology

## 2013-04-13 ENCOUNTER — Encounter: Payer: Self-pay | Admitting: Cardiology

## 2013-04-13 VITALS — BP 120/82 | HR 84 | Ht 65.0 in | Wt 171.0 lb

## 2013-04-13 DIAGNOSIS — Z1231 Encounter for screening mammogram for malignant neoplasm of breast: Secondary | ICD-10-CM

## 2013-04-13 DIAGNOSIS — I059 Rheumatic mitral valve disease, unspecified: Secondary | ICD-10-CM

## 2013-04-13 DIAGNOSIS — I050 Rheumatic mitral stenosis: Secondary | ICD-10-CM

## 2013-04-13 DIAGNOSIS — I4891 Unspecified atrial fibrillation: Secondary | ICD-10-CM

## 2013-04-13 NOTE — Assessment & Plan Note (Signed)
Continue Cardizem for rate control. Continue Coumadin. INR is monitored by primary care. Hemoglobin is also monitored by primary care.

## 2013-04-13 NOTE — Progress Notes (Signed)
HPI: FU permanent atrial fibrillation, MS and rheumatic heart disease. She has had prior balloon valvuloplasty at [facility] in June 2000. We previously referred her to [facility]. She had a stress echo and was felt to have mild to moderate MS and medical therapy was recommended. Repeat valvuloplasty in the future was felt to be problematic as it may increase MR. Last echo was performed in November 2013. LV function normal. 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 Valve area by pressure half-time 1.26 cm. Severe left atrial enlargement and moderate to severe right atrial enlargement. Carotid Dopplers in November of 2013 were normal. Abnormal thyroid appearance which was followed with a dedicated ultrasound. She was found to have nodules which were biopsied. She had cytologic atypia. Patient subsequently had total thyroidectomy. I last saw her in Nov 2013. Since she was last seen the patient has dyspnea with more extreme activities but not with routine activities. It is relieved with rest. It is not associated with chest pain. There is no orthopnea, PND or pedal edema. There is no syncope or palpitations. There is no exertional chest pain.   Current Outpatient Prescriptions  Medication Sig Dispense Refill  . buPROPion (WELLBUTRIN XL) 300 MG 24 hr tablet TAKE 1 TABLET DAILY  90 tablet  3  . calcium carbonate (OS-CAL) 600 MG TABS Take 600 mg by mouth daily.       Marland Kitchen diltiazem (CARDIZEM CD) 240 MG 24 hr capsule TAKE 1 CAPSULE DAILY  90 capsule  0  . estrogen, conjugated,-medroxyprogesterone (PREMPRO) 0.3-1.5 MG per tablet Take 1 tablet by mouth every morning.       Marland Kitchen levothyroxine (SYNTHROID, LEVOTHROID) 125 MCG tablet Take 125 mcg by mouth daily before breakfast.      . Multiple Vitamins-Minerals (MULTIVITAMIN WITH MINERALS) tablet Take 1 tablet by mouth daily.       . Omega-3 Fatty Acids (FISH OIL) 1000 MG CAPS Take 1 capsule by mouth  daily.       . Vitamin D, Ergocalciferol, (DRISDOL) 50000 UNITS CAPS capsule Take 1 capsule (50,000 Units total) by mouth every Monday.  30 capsule  3  . warfarin (COUMADIN) 2.5 MG tablet Take 2.5 mg by mouth daily.        No current facility-administered medications for this visit.     Past Medical History  Diagnosis Date  . Mitral stenosis 08/26/2008  . MITRAL REGURGITATION 08/26/2008  . RHEUMATIC HEART DISEASE 08/26/2008  . Atrial fibrillation 08/26/2008  . Depression   . Hematuria     workup by Dr. Retta Diones  . Thyroid nodule     Past Surgical History  Procedure Laterality Date  . Percutaneous balloon valvuloplasty  10/1998    [facility]  . Cesarean section    . Tonsillectomy  1956  . Screw in ankles  1989    right  . Colonoscopy    . Cardioversion      2000  . Thyroidectomy  05/30/2012    Procedure: THYROIDECTOMY;  Surgeon: Velora Heckler, MD;  Location: [facility];  Service: General;  Laterality: N/A;  total thyroidectomy  . Tubal ligation      History   Social History  . Marital Status: Married    Spouse Name: N/A    Number of Children: 2  . Years of Education: N/A   Occupational History  . branch office administrator    Social History Main Topics  . Smoking status: Never Smoker   .  Smokeless tobacco: Never Used  . Alcohol Use: Yes     Comment: occas glass of wine weekly  . Drug Use: No  . Sexual Activity: Yes    Partners: Male    Birth Control/ Protection: Surgical     Comment: BTL   Other Topics Concern  . Not on file   Social History Narrative  . No narrative on file    ROS: no fevers or chills, productive cough, hemoptysis, dysphasia, odynophagia, melena, hematochezia, dysuria, hematuria, rash, seizure activity, orthopnea, PND, pedal edema, claudication. Remaining systems are negative.  Physical Exam: Well-developed well-nourished in no acute distress.  Skin is warm and dry.  HEENT is normal.  Neck is supple.  Chest is clear to  auscultation with normal expansion.  Cardiovascular exam is irregular Abdominal exam nontender or distended. No masses palpated. Extremities show no edema. neuro grossly intact  ECG atrial fibrillation at a rate of 84. Prior lateral infarct. Cannot rule out prior septal infarct.

## 2013-04-13 NOTE — Assessment & Plan Note (Signed)
Plan repeat echocardiogram. She will most likely need mitral valve replacement in the future. She will not be a candidate for redo mitral valvuloplasty as she has had mitral regurgitation previously. Continue SBE prophylaxis.

## 2013-04-13 NOTE — Patient Instructions (Signed)

## 2013-04-14 ENCOUNTER — Ambulatory Visit (HOSPITAL_COMMUNITY)
Admission: RE | Admit: 2013-04-14 | Discharge: 2013-04-14 | Disposition: A | Discharge status: Home / Self Care | Payer: BC Managed Care – PPO | Source: Ambulatory Visit | Attending: Cardiovascular Disease | Admitting: Cardiovascular Disease

## 2013-04-14 ENCOUNTER — Telehealth: Payer: Self-pay | Admitting: *Deleted

## 2013-04-14 ENCOUNTER — Other Ambulatory Visit: Payer: Self-pay | Admitting: Nurse Practitioner

## 2013-04-14 DIAGNOSIS — I4891 Unspecified atrial fibrillation: Secondary | ICD-10-CM

## 2013-04-14 DIAGNOSIS — I059 Rheumatic mitral valve disease, unspecified: Secondary | ICD-10-CM | POA: Insufficient documentation

## 2013-04-14 NOTE — Progress Notes (Signed)
2D Echo Performed 04/14/2013    Blaire Palomino, RCS  

## 2013-04-20 ENCOUNTER — Other Ambulatory Visit: Payer: Self-pay | Admitting: Cardiology

## 2014-02-11 ENCOUNTER — Other Ambulatory Visit: Payer: Self-pay | Admitting: Nurse Practitioner

## 2014-02-11 ENCOUNTER — Other Ambulatory Visit: Payer: Self-pay | Admitting: *Deleted

## 2014-02-11 MED ORDER — BUPROPION HCL ER (XL) 300 MG PO TB24: ORAL_TABLET | ORAL | Status: DC

## 2014-02-11 MED ORDER — DILTIAZEM HCL ER COATED BEADS 240 MG PO CP24: ORAL_CAPSULE | ORAL | Status: DC

## 2014-02-11 NOTE — Telephone Encounter (Signed)
Pt asking for refill says that she has about 3 weeks left right now but wanted to go ahead and call just incase we sent it to mail order  buPROPion (WELLBUTRIN XL) 300 MG 24 hr tablet  TAKE 1 TABLET DAILY, Normal, Last Dose: Not Recorded  Refills: 3 ordered Pharmacy: [facility], [place] home Delivery if we send 90 days [phone]  [facility] if we send 30 days [phone]

## 2014-02-11 NOTE — Telephone Encounter (Signed)
Last AEX and refill 03/30/13 #90/3R Next appt 04/19/14.  Please advise.

## 2014-02-15 ENCOUNTER — Telehealth: Payer: Self-pay | Admitting: Nurse Practitioner

## 2014-02-15 MED ORDER — BUPROPION HCL ER (XL) 300 MG PO TB24: ORAL_TABLET | ORAL | Status: DC

## 2014-02-15 NOTE — Telephone Encounter (Signed)
Rx sent to [facility] delivery today.

## 2014-02-15 NOTE — Telephone Encounter (Signed)
pts medication refill was sent to wrong pharmacy. [facility] to [place]. Below is previous phone note.  Pt asking for refill says that she has about 3 weeks left right now but wanted to go ahead and call just incase we sent it to mail order  buPROPion (WELLBUTRIN XL) 300 MG 24 hr tablet  TAKE 1 TABLET DAILY, Normal, Last Dose: Not Recorded  Refills: 3 ordered Pharmacy: [facility], [place] home Delivery if we send 90 days  [phone]  [facility] if we send 30 days  [phone]

## 2014-03-08 ENCOUNTER — Encounter: Payer: Self-pay | Admitting: Cardiology

## 2014-04-06 ENCOUNTER — Encounter: Payer: Self-pay | Admitting: Nurse Practitioner

## 2014-04-06 ENCOUNTER — Ambulatory Visit (INDEPENDENT_AMBULATORY_CARE_PROVIDER_SITE_OTHER): Payer: Medicare HMO | Admitting: Nurse Practitioner

## 2014-04-06 VITALS — BP 118/80 | HR 92 | Resp 16 | Ht 65.0 in | Wt 164.0 lb

## 2014-04-06 DIAGNOSIS — Z01419 Encounter for gynecological examination (general) (routine) without abnormal findings: Secondary | ICD-10-CM

## 2014-04-06 DIAGNOSIS — E2839 Other primary ovarian failure: Secondary | ICD-10-CM

## 2014-04-06 DIAGNOSIS — Z0000 Encounter for general adult medical examination without abnormal findings: Secondary | ICD-10-CM

## 2014-04-06 DIAGNOSIS — R8290 Unspecified abnormal findings in urine: Secondary | ICD-10-CM

## 2014-04-06 LAB — POCT URINALYSIS DIPSTICK
BILIRUBIN UA: NEGATIVE
GLUCOSE UA: NEGATIVE
Ketones, UA: NEGATIVE
NITRITE UA: NEGATIVE
Protein, UA: NEGATIVE
Urobilinogen, UA: NEGATIVE
pH, UA: 5

## 2014-04-06 LAB — HEMOGLOBIN, FINGERSTICK: Hemoglobin, fingerstick: 13.6 g/dL (ref 12.0–16.0)

## 2014-04-06 MED ORDER — VITAMIN D (ERGOCALCIFEROL) 1.25 MG (50000 UNIT) PO CAPS: 50000.0000 [IU] | ORAL_CAPSULE | ORAL | Status: DC

## 2014-04-06 MED ORDER — BUPROPION HCL ER (XL) 300 MG PO TB24: ORAL_TABLET | ORAL | Status: DC

## 2014-04-06 NOTE — Patient Instructions (Signed)

## 2014-04-06 NOTE — Progress Notes (Signed)
63 y.o. G62P1002 Married Caucasian Fe here for annual exam.  Tapered off HRT in late March 2015. Had some vaso symptoms and some brain fog.  Better now.  Has been under cardio ev al and will need mitral valve replacement within a few years. Now retired.  Patient's last menstrual period was 05/07/2002.          Sexually active: Yes.    The current method of family planning is post menopausal status.    Exercising: No.  Some walking Smoker:  no  Health Maintenance: Pap: 03/2013 Neg. HR HPV: Neg MMG:  04/13/2013 BIRADS1: Neg Colonoscopy: 2012 Normal - Repeat 10 years  BMD:   2009 TDaP: 2009 Labs: Here UA:RBC=Mod, WBC=large Hg: 13.5   reports that she has never smoked. She has never used smokeless tobacco. She reports that she drinks about 0.6 oz of alcohol per week. She reports that she does not use illicit drugs.  Past Medical History  Diagnosis Date  . Mitral stenosis 08/26/2008  . MITRAL REGURGITATION 08/26/2008  . RHEUMATIC HEART DISEASE 08/26/2008  . Atrial fibrillation 08/26/2008  . Depression   . Hematuria     workup by Dr. Diona Fanti  . Thyroid nodule     Past Surgical History  Procedure Laterality Date  . Percutaneous balloon valvuloplasty  10/1998    [facility]  . Cesarean section    . Tonsillectomy  1956  . Screw in ankles  1989    right  . Colonoscopy    . Cardioversion      2000  . Thyroidectomy  05/30/2012    Procedure: THYROIDECTOMY;  Surgeon: Earnstine Regal, MD;  Location: [facility];  Service: General;  Laterality: N/A;  total thyroidectomy  . Tubal ligation      Current Outpatient Prescriptions  Medication Sig Dispense Refill  . buPROPion (WELLBUTRIN XL) 300 MG 24 hr tablet TAKE 1 TABLET DAILY 90 tablet 3  . calcium carbonate (OS-CAL) 600 MG TABS Take 600 mg by mouth daily.     Marland Kitchen diltiazem (CARDIZEM CD) 240 MG 24 hr capsule TAKE 1 CAPSULE DAILY 90 capsule 3  . levothyroxine (SYNTHROID, LEVOTHROID) 125 MCG tablet Take 125 mcg by mouth daily before breakfast.     . Multiple Vitamins-Minerals (MULTIVITAMIN WITH MINERALS) tablet Take 1 tablet by mouth daily.     . Omega-3 Fatty Acids (FISH OIL) 1000 MG CAPS Take 1 capsule by mouth daily.     . Vitamin D, Ergocalciferol, (DRISDOL) 50000 UNITS CAPS capsule Take 1 capsule (50,000 Units total) by mouth every Monday. 30 capsule 3  . warfarin (COUMADIN) 2.5 MG tablet Take 2.5 mg by mouth daily.      No current facility-administered medications for this visit.    Family History  Problem Relation Age of Onset  . Lung cancer Father   . Cancer Father     lung  . Heart disease Mother     S/P CABG  . Cancer Brother     brain  . Heart disease Maternal Grandmother   . Hypertension Sister     ROS:  Pertinent items are noted in HPI.  Otherwise, a comprehensive ROS was negative.  Exam:   BP 118/80 mmHg  Pulse 92  Resp 16  Ht 5\' 5"  (1.651 m)  Wt 164 lb (74.39 kg)  BMI 27.29 kg/m2  LMP 05/07/2002 Height: 5\' 5"  (165.1 cm)  Ht Readings from Last 3 Encounters:  04/06/14 5\' 5"  (1.651 m)  04/13/13 5\' 5"  (1.651 m)  03/30/13  5\' 5"  (1.651 m)    General appearance: alert, cooperative and appears stated age Head: Normocephalic, without obvious abnormality, atraumatic Neck: no adenopathy, supple, symmetrical, trachea midline and thyroid normal to inspection and palpation Lungs: clear to auscultation bilaterally Breasts: normal appearance, no masses or tenderness Heart: regular rate and rhythm Abdomen: soft, non-tender; no masses,  no organomegaly Extremities: extremities normal, atraumatic, no cyanosis or edema Skin: Skin color, texture, turgor normal. No rashes or lesions Lymph nodes: Cervical, supraclavicular, and axillary nodes normal. No abnormal inguinal nodes palpated Neurologic: Grossly normal   Pelvic: External genitalia:  no lesions              Urethra:  normal appearing urethra with no masses, tenderness or lesions              Bartholin's and Skene's: normal                 Vagina: normal  appearing vagina with normal color and discharge, no lesions              Cervix: anteverted              Pap taken: No. Bimanual Exam:  Uterus:  normal size, contour, position, consistency, mobility, non-tender              Adnexa: no mass, fullness, tenderness               Rectovaginal: Confirms               Anus:  normal sphincter tone, no lesions  A:  Well Woman with normal exam  History of Mitral valve Stenosis and regurgitation on Coumadin Postmenopausal on HRT -  tapered off March 2015  Situational stress Vit D deficiency, hypothyroid  R/O UTI - asymptomatic   P:   Reviewed health and wellness pertinent to exam  Pap smear not taken today  Mammogram is due 12/15 and order sent for DEXA  Follow with labs  Follow with urine C&S and micro  Refill Vit D and follow with labs  Refill Wellbutrin for a year  Counseled on breast self exam, mammography screening, adequate intake of calcium and vitamin D, diet and exercise, Kegel's exercises return annually or prn  An After Visit Summary was printed and given to the patient.

## 2014-04-07 LAB — CBC WITH DIFFERENTIAL/PLATELET
Basophils Absolute: 0 10*3/uL (ref 0.0–0.1)
Basophils Relative: 0 % (ref 0–1)
Eosinophils Absolute: 0.1 10*3/uL (ref 0.0–0.7)
Eosinophils Relative: 1 % (ref 0–5)
HEMATOCRIT: 41.9 % (ref 36.0–46.0)
HEMOGLOBIN: 13.8 g/dL (ref 12.0–15.0)
LYMPHS ABS: 1.5 10*3/uL (ref 0.7–4.0)
LYMPHS PCT: 20 % (ref 12–46)
MCH: 30.1 pg (ref 26.0–34.0)
MCHC: 32.9 g/dL (ref 30.0–36.0)
MCV: 91.5 fL (ref 78.0–100.0)
MONO ABS: 0.6 10*3/uL (ref 0.1–1.0)
MONOS PCT: 8 % (ref 3–12)
MPV: 10.8 fL (ref 9.4–12.4)
NEUTROS ABS: 5.3 10*3/uL (ref 1.7–7.7)
Neutrophils Relative %: 71 % (ref 43–77)
Platelets: 229 10*3/uL (ref 150–400)
RBC: 4.58 MIL/uL (ref 3.87–5.11)
RDW: 13.9 % (ref 11.5–15.5)
WBC: 7.4 10*3/uL (ref 4.0–10.5)

## 2014-04-07 LAB — COMPREHENSIVE METABOLIC PANEL
ALT: 12 U/L (ref 0–35)
AST: 19 U/L (ref 0–37)
Albumin: 4.3 g/dL (ref 3.5–5.2)
Alkaline Phosphatase: 74 U/L (ref 39–117)
BILIRUBIN TOTAL: 0.6 mg/dL (ref 0.2–1.2)
BUN: 17 mg/dL (ref 6–23)
CO2: 28 meq/L (ref 19–32)
CREATININE: 0.99 mg/dL (ref 0.50–1.10)
Calcium: 9.4 mg/dL (ref 8.4–10.5)
Chloride: 104 mEq/L (ref 96–112)
Glucose, Bld: 74 mg/dL (ref 70–99)
Potassium: 3.9 mEq/L (ref 3.5–5.3)
Sodium: 141 mEq/L (ref 135–145)
Total Protein: 7.3 g/dL (ref 6.0–8.3)

## 2014-04-07 LAB — URINALYSIS, MICROSCOPIC ONLY
Bacteria, UA: NONE SEEN
CASTS: NONE SEEN
CRYSTALS: NONE SEEN
Squamous Epithelial / LPF: NONE SEEN

## 2014-04-07 LAB — LIPID PANEL
CHOL/HDL RATIO: 3 ratio
CHOLESTEROL: 178 mg/dL (ref 0–200)
HDL: 59 mg/dL (ref >39)
LDL Cholesterol: 107 mg/dL — ABNORMAL HIGH (ref 0–99)
Triglycerides: 60 mg/dL (ref <150)
VLDL: 12 mg/dL (ref 0–40)

## 2014-04-07 LAB — TSH: TSH: 2.485 u[IU]/mL (ref 0.350–4.500)

## 2014-04-07 LAB — VITAMIN D 25 HYDROXY (VIT D DEFICIENCY, FRACTURES): VIT D 25 HYDROXY: 44 ng/mL (ref 30–100)

## 2014-04-08 LAB — URINE CULTURE
COLONY COUNT: NO GROWTH
ORGANISM ID, BACTERIA: NO GROWTH

## 2014-04-11 NOTE — Progress Notes (Signed)
Encounter reviewed by Dr. Damara Klunder Silva.  

## 2014-04-14 ENCOUNTER — Other Ambulatory Visit: Payer: Self-pay

## 2014-04-14 DIAGNOSIS — Z1231 Encounter for screening mammogram for malignant neoplasm of breast: Secondary | ICD-10-CM

## 2014-04-17 NOTE — Progress Notes (Signed)
HPI: FU permanent atrial fibrillation, MS and rheumatic heart disease. She has had prior balloon valvuloplasty at [facility] in June 2000. We previously referred her to [facility]. She had a stress echo and was felt to have mild to moderate MS and medical therapy was recommended. Repeat valvuloplasty in the future was felt to be problematic as it may increase MR. Carotid Dopplers in November of 2013 were normal. Abnormal thyroid appearance which was followed with a dedicated ultrasound. She was found to have nodules which were biopsied. She had cytologic atypia. Patient subsequently had total thyroidectomy. Last echo 12/14 showed normal LV function, moderate MS with mean gradient 7 mmHg, moderate MR, biatrial enlargement, moderate TR and mildly elevated pulmonary pressures. Since I last saw her, the patient has dyspnea with more extreme activities but not with routine activities. It is relieved with rest. It is not associated with chest pain. There is no orthopnea, PND or pedal edema. There is no syncope or palpitations. There is no exertional chest pain.   Current Outpatient Prescriptions  Medication Sig Dispense Refill  . buPROPion (WELLBUTRIN XL) 300 MG 24 hr tablet TAKE 1 TABLET DAILY 90 tablet 3  . calcium carbonate (OS-CAL) 600 MG TABS Take 600 mg by mouth daily.     Marland Kitchen diltiazem (CARDIZEM CD) 240 MG 24 hr capsule TAKE 1 CAPSULE DAILY 90 capsule 3  . levothyroxine (SYNTHROID, LEVOTHROID) 125 MCG tablet Take 125 mcg by mouth daily before breakfast.    . Multiple Vitamins-Minerals (MULTIVITAMIN WITH MINERALS) tablet Take 1 tablet by mouth daily.     . Omega-3 Fatty Acids (FISH OIL) 1000 MG CAPS Take 1 capsule by mouth daily.     . Vitamin D, Ergocalciferol, (DRISDOL) 50000 UNITS CAPS capsule Take 1 capsule (50,000 Units total) by mouth every Monday. 30 capsule 3  . warfarin (COUMADIN) 2.5 MG tablet Take 2.5 mg by mouth daily.      No current facility-administered medications for this visit.      Past Medical History  Diagnosis Date  . Mitral stenosis 08/26/2008  . MITRAL REGURGITATION 08/26/2008  . RHEUMATIC HEART DISEASE 08/26/2008  . Atrial fibrillation 08/26/2008  . Depression   . Hematuria     workup by Dr. Diona Fanti  . Thyroid nodule     Past Surgical History  Procedure Laterality Date  . Percutaneous balloon valvuloplasty  10/1998    [facility]  . Cesarean section    . Tonsillectomy  1956  . Screw in ankles  1989    right  . Colonoscopy    . Cardioversion      2000  . Thyroidectomy  05/30/2012    Procedure: THYROIDECTOMY;  Surgeon: Earnstine Regal, MD;  Location: [facility];  Service: General;  Laterality: N/A;  total thyroidectomy  . Tubal ligation      History   Social History  . Marital Status: Married    Spouse Name: N/A    Number of Children: 2  . Years of Education: N/A   Occupational History  . branch office administrator    Social History Main Topics  . Smoking status: Never Smoker   . Smokeless tobacco: Never Used  . Alcohol Use: 0.6 oz/week    1 Glasses of wine per week     Comment: occas glass of wine weekly  . Drug Use: No  . Sexual Activity:    Partners: Male    Birth Control/ Protection: Surgical, Post-menopausal     Comment: BTL  Other Topics Concern  . Not on file   Social History Narrative    ROS: no fevers or chills, productive cough, hemoptysis, dysphasia, odynophagia, melena, hematochezia, dysuria, hematuria, rash, seizure activity, orthopnea, PND, pedal edema, claudication. Remaining systems are negative.  Physical Exam: Well-developed well-nourished in no acute distress.  Skin is warm and dry.  HEENT is normal.  Neck is supple.  Chest is clear to auscultation with normal expansion.  Cardiovascular exam is irregular Abdominal exam nontender or distended. No masses palpated. Extremities show no edema. neuro grossly intact  ECG atrial fibrillation at a rate of 88. Cannot rule out prior septal infarct.  Nonspecific ST changes.

## 2014-04-19 ENCOUNTER — Encounter: Payer: Self-pay | Admitting: Cardiology

## 2014-04-19 ENCOUNTER — Encounter: Payer: Self-pay | Admitting: *Deleted

## 2014-04-19 ENCOUNTER — Ambulatory Visit (INDEPENDENT_AMBULATORY_CARE_PROVIDER_SITE_OTHER): Payer: Medicare HMO | Admitting: Cardiology

## 2014-04-19 DIAGNOSIS — I4891 Unspecified atrial fibrillation: Secondary | ICD-10-CM

## 2014-04-19 DIAGNOSIS — I050 Rheumatic mitral stenosis: Secondary | ICD-10-CM

## 2014-04-19 NOTE — Assessment & Plan Note (Signed)
Continue Cardizem for rate control. Continue Coumadin. Recent hemoglobin normal. Patient not a candidate for NOAC given valvular heart disease.

## 2014-04-19 NOTE — Patient Instructions (Signed)

## 2014-04-19 NOTE — Assessment & Plan Note (Addendum)
Plan repeat echocardiogram.patient understands she may require mitral valve replacement in the future. She has had prior valvuloplasty. Continue SBE prophylaxis.

## 2014-04-21 ENCOUNTER — Ambulatory Visit (HOSPITAL_COMMUNITY)
Admission: RE | Admit: 2014-04-21 | Discharge: 2014-04-21 | Disposition: A | Discharge status: Home / Self Care | Payer: Medicare HMO | Source: Ambulatory Visit | Attending: Internal Medicine | Admitting: Internal Medicine

## 2014-04-21 DIAGNOSIS — I059 Rheumatic mitral valve disease, unspecified: Secondary | ICD-10-CM | POA: Insufficient documentation

## 2014-04-21 DIAGNOSIS — I050 Rheumatic mitral stenosis: Secondary | ICD-10-CM

## 2014-04-21 DIAGNOSIS — I369 Nonrheumatic tricuspid valve disorder, unspecified: Secondary | ICD-10-CM

## 2014-04-21 NOTE — Progress Notes (Signed)
2D Echo Performed 04/21/2014    Marygrace Drought, RCS

## 2014-05-05 ENCOUNTER — Ambulatory Visit
Admission: RE | Admit: 2014-05-05 | Discharge: 2014-05-05 | Disposition: A | Discharge status: Home / Self Care | Payer: Medicare HMO | Source: Ambulatory Visit | Attending: Nurse Practitioner | Admitting: Nurse Practitioner

## 2014-05-05 ENCOUNTER — Ambulatory Visit
Admission: RE | Admit: 2014-05-05 | Discharge: 2014-05-05 | Disposition: A | Discharge status: Home / Self Care | Payer: Medicare HMO | Source: Ambulatory Visit

## 2014-05-05 DIAGNOSIS — E2839 Other primary ovarian failure: Secondary | ICD-10-CM

## 2014-05-05 DIAGNOSIS — Z1231 Encounter for screening mammogram for malignant neoplasm of breast: Secondary | ICD-10-CM

## 2014-09-28 ENCOUNTER — Encounter: Payer: Self-pay | Admitting: Gastroenterology

## 2014-10-22 ENCOUNTER — Telehealth (HOSPITAL_COMMUNITY): Payer: Self-pay | Admitting: *Deleted

## 2014-10-22 NOTE — Telephone Encounter (Signed)
Pt was called to schedule echocardiogram. The recall is in for 6 months however the patient thinks that she is not due for a year.  Please advise  Thanks

## 2014-10-22 NOTE — Telephone Encounter (Signed)
There was no change in the echo she had 04/2014. Will be okay to wait one year to repeat unless she is having problems.

## 2015-02-10 DIAGNOSIS — I4891 Unspecified atrial fibrillation: Secondary | ICD-10-CM | POA: Diagnosis not present

## 2015-03-01 DIAGNOSIS — I4891 Unspecified atrial fibrillation: Secondary | ICD-10-CM | POA: Diagnosis not present

## 2015-03-15 DIAGNOSIS — I4891 Unspecified atrial fibrillation: Secondary | ICD-10-CM | POA: Diagnosis not present

## 2015-03-25 ENCOUNTER — Telehealth: Payer: Self-pay | Admitting: Cardiology

## 2015-03-25 MED ORDER — DILTIAZEM HCL ER COATED BEADS 240 MG PO CP24: ORAL_CAPSULE | ORAL | Status: DC

## 2015-03-25 NOTE — Telephone Encounter (Signed)
Rx was sent to pt's pharmacy as requested, advising pt to also make a yearly office appointment before anymore refills.

## 2015-03-25 NOTE — Telephone Encounter (Signed)
New message       *STAT* If patient is at the pharmacy, call can be transferred to refill team.   1. Which medications need to be refilled? (please list name of each medication and dose if known) diltiazem cd 240mg   2. Which pharmacy/location (including street and city if local pharmacy) is medication to be sent to?  [facility] [phone] 3. Do they need a 30 day or 90 day supply? 90 day supply

## 2015-04-05 DIAGNOSIS — I4891 Unspecified atrial fibrillation: Secondary | ICD-10-CM | POA: Diagnosis not present

## 2015-04-08 ENCOUNTER — Ambulatory Visit: Payer: Medicare HMO | Admitting: Nurse Practitioner

## 2015-04-20 DIAGNOSIS — I4891 Unspecified atrial fibrillation: Secondary | ICD-10-CM | POA: Diagnosis not present

## 2015-04-25 ENCOUNTER — Other Ambulatory Visit: Payer: Self-pay

## 2015-04-25 DIAGNOSIS — Z1231 Encounter for screening mammogram for malignant neoplasm of breast: Secondary | ICD-10-CM

## 2015-04-26 ENCOUNTER — Ambulatory Visit: Payer: Medicare HMO | Admitting: Nurse Practitioner

## 2015-04-27 ENCOUNTER — Ambulatory Visit (INDEPENDENT_AMBULATORY_CARE_PROVIDER_SITE_OTHER): Payer: Medicare HMO | Admitting: Nurse Practitioner

## 2015-04-27 ENCOUNTER — Encounter: Payer: Self-pay | Admitting: Nurse Practitioner

## 2015-04-27 VITALS — BP 110/72 | HR 64 | Ht 65.0 in | Wt 169.0 lb

## 2015-04-27 DIAGNOSIS — R319 Hematuria, unspecified: Secondary | ICD-10-CM | POA: Diagnosis not present

## 2015-04-27 DIAGNOSIS — E559 Vitamin D deficiency, unspecified: Secondary | ICD-10-CM

## 2015-04-27 DIAGNOSIS — Z0000 Encounter for general adult medical examination without abnormal findings: Secondary | ICD-10-CM | POA: Diagnosis not present

## 2015-04-27 DIAGNOSIS — Z01419 Encounter for gynecological examination (general) (routine) without abnormal findings: Secondary | ICD-10-CM | POA: Diagnosis not present

## 2015-04-27 LAB — POCT URINALYSIS DIPSTICK
BILIRUBIN UA: NEGATIVE
GLUCOSE UA: NEGATIVE
KETONES UA: NEGATIVE
Leukocytes, UA: NEGATIVE
NITRITE UA: NEGATIVE
PH UA: 5
Protein, UA: NEGATIVE
RBC UA: NEGATIVE
Urobilinogen, UA: NEGATIVE

## 2015-04-27 NOTE — Patient Instructions (Signed)

## 2015-04-27 NOTE — Progress Notes (Signed)
Patient ID: Lorraine Kemp, female   DOB: 23-Jun-1950, 64 y.o.   MRN: [id number]  64 y.o. G43P1002 Married  Caucasian Fe here for annual exam.  No new health problems. Still on Coumadin for A Fib and mitral regurgitaion.  Patient's last menstrual period was 05/07/2002 (approximate).          Sexually active: Yes.    The current method of family planning is post menopausal status.    Exercising: Yes.    walking 2-3 times per week Smoker:  no  Health Maintenance: Pap: 03/30/13, Negative with neg HR HPV MMG: 05/05/14, Bi-Rads 1: negative; scheduled for 05/26/15 Colonscopy: 04/2011, normal, repeat in 10 years BMD: 05/05/14, T Score -1.2 Spine / -1.9 Left Femur Neck TDaP: 12/16/07 Shingles: 01/28/15 Pneumonia: will check with PCP to see if she is current Hep C and HIV: HIV not indicated due to age; Hep C to be done today Labs: HB: 12.8  Urine: Negative    reports that she has never smoked. She has never used smokeless tobacco. She reports that she drinks about 0.6 oz of alcohol per week. She reports that she does not use illicit drugs.  Past Medical History  Diagnosis Date  . Mitral stenosis 08/26/2008  . MITRAL REGURGITATION 08/26/2008  . RHEUMATIC HEART DISEASE 08/26/2008  . Atrial fibrillation ([place]) 08/26/2008  . Depression   . Hematuria     workup by Dr. Diona Fanti  . Thyroid nodule     Past Surgical History  Procedure Laterality Date  . Percutaneous balloon valvuloplasty  10/1998    [facility]  . Cesarean section    . Tonsillectomy  1956  . Screw in ankles  1989    right  . Colonoscopy    . Cardioversion      2000  . Thyroidectomy  05/30/2012    Procedure: THYROIDECTOMY;  Surgeon: Earnstine Regal, MD;  Location: [facility];  Service: General;  Laterality: N/A;  total thyroidectomy  . Tubal ligation      Current Outpatient Prescriptions  Medication Sig Dispense Refill  . buPROPion (WELLBUTRIN XL) 300 MG 24 hr tablet TAKE 1 TABLET DAILY 90 tablet 3  . calcium carbonate (OS-CAL)  600 MG TABS Take 600 mg by mouth daily.     . Cholecalciferol (VITAMIN D) 2000 UNITS CAPS Take 1 capsule by mouth daily.    Marland Kitchen diltiazem (CARDIZEM CD) 240 MG 24 hr capsule TAKE 1 CAPSULE DAILY 90 capsule 0  . levothyroxine (SYNTHROID, LEVOTHROID) 125 MCG tablet Take 125 mcg by mouth daily before breakfast.    . Multiple Vitamins-Minerals (MULTIVITAMIN WITH MINERALS) tablet Take 1 tablet by mouth daily.     . Omega-3 Fatty Acids (FISH OIL) 1000 MG CAPS Take 1 capsule by mouth daily.     Marland Kitchen warfarin (COUMADIN) 2.5 MG tablet Take 2.5 mg by mouth daily.      No current facility-administered medications for this visit.    Family History  Problem Relation Age of Onset  . Lung cancer Father   . Cancer Father     lung  . Heart disease Mother     S/P CABG  . Cancer Brother     brain  . Heart disease Maternal Grandmother   . Hypertension Sister     ROS:  Pertinent items are noted in HPI.  Otherwise, a comprehensive ROS was negative.  Exam:   BP 110/72 mmHg  Pulse 64  Ht 5\' 5"  (1.651 m)  Wt 169 lb (76.658 kg)  BMI  28.12 kg/m2  LMP 05/07/2002 (Approximate) Height: 5\' 5"  (165.1 cm) Ht Readings from Last 3 Encounters:  04/27/15 5\' 5"  (1.651 m)  04/19/14 5\' 5"  (1.651 m)  04/06/14 5\' 5"  (1.651 m)    General appearance: alert, cooperative and appears stated age Head: Normocephalic, without obvious abnormality, atraumatic Neck: no adenopathy, supple, symmetrical, trachea midline and thyroid normal to inspection and palpation Lungs: clear to auscultation bilaterally Breasts: normal appearance, no masses or tenderness Heart: regular rate and rhythm Abdomen: soft, non-tender; no masses,  no organomegaly Extremities: extremities normal, atraumatic, no cyanosis or edema Skin: Skin color, texture, turgor normal. No rashes or lesions Lymph nodes: Cervical, supraclavicular, and axillary nodes normal. No abnormal inguinal nodes palpated Neurologic: Grossly normal   Pelvic: External genitalia:   no lesions              Urethra:  normal appearing urethra with no masses, tenderness or lesions              Bartholin's and Skene's: normal                 Vagina: normal appearing vagina with normal color and discharge, no lesions              Cervix: anteverted              Pap taken: No. Bimanual Exam:  Uterus:  normal size, contour, position, consistency, mobility, non-tender              Adnexa: no mass, fullness, tenderness               Rectovaginal: Confirms               Anus:  normal sphincter tone, no lesions  Chaperone present: no  A:  Well Woman with normal exam  History of Mitral valve Stenosis and regurgitation on Coumadin Postmenopausal on HRT - tapered off March 2015  Situational stress Vit D deficiency, hypothyroid   P:   Reviewed health and wellness pertinent to exam  Pap smear as above  mammogram counseled on breast self exam, mammography screening, adequate intake of calcium and vitamin D, diet and exercise, Kegel's exercises return annually or prn  An After Visit Summary was printed and given to the patient.

## 2015-04-27 NOTE — Progress Notes (Signed)
Encounter reviewed Lorraine Schier, MD   

## 2015-04-28 LAB — HEPATITIS C ANTIBODY: HCV AB: NEGATIVE

## 2015-04-28 LAB — HEMOGLOBIN, FINGERSTICK: Hemoglobin, fingerstick: 12.8 g/dL (ref 12.0–16.0)

## 2015-04-28 LAB — VITAMIN D 25 HYDROXY (VIT D DEFICIENCY, FRACTURES): VIT D 25 HYDROXY: 40 ng/mL (ref 30–100)

## 2015-05-11 DIAGNOSIS — I4891 Unspecified atrial fibrillation: Secondary | ICD-10-CM | POA: Diagnosis not present

## 2015-05-12 DIAGNOSIS — R69 Illness, unspecified: Secondary | ICD-10-CM | POA: Diagnosis not present

## 2015-05-26 ENCOUNTER — Ambulatory Visit
Admission: RE | Admit: 2015-05-26 | Discharge: 2015-05-26 | Disposition: A | Discharge status: Home / Self Care | Payer: Medicare HMO | Source: Ambulatory Visit

## 2015-05-26 DIAGNOSIS — Z1231 Encounter for screening mammogram for malignant neoplasm of breast: Secondary | ICD-10-CM

## 2015-06-08 DIAGNOSIS — I4891 Unspecified atrial fibrillation: Secondary | ICD-10-CM | POA: Diagnosis not present

## 2015-07-05 DIAGNOSIS — E559 Vitamin D deficiency, unspecified: Secondary | ICD-10-CM | POA: Diagnosis not present

## 2015-07-05 DIAGNOSIS — Z23 Encounter for immunization: Secondary | ICD-10-CM | POA: Diagnosis not present

## 2015-07-05 DIAGNOSIS — Z0000 Encounter for general adult medical examination without abnormal findings: Secondary | ICD-10-CM | POA: Diagnosis not present

## 2015-07-05 DIAGNOSIS — I4891 Unspecified atrial fibrillation: Secondary | ICD-10-CM | POA: Diagnosis not present

## 2015-07-05 DIAGNOSIS — I482 Chronic atrial fibrillation: Secondary | ICD-10-CM | POA: Diagnosis not present

## 2015-07-05 DIAGNOSIS — E890 Postprocedural hypothyroidism: Secondary | ICD-10-CM | POA: Diagnosis not present

## 2015-07-05 DIAGNOSIS — D225 Melanocytic nevi of trunk: Secondary | ICD-10-CM | POA: Diagnosis not present

## 2015-07-06 NOTE — Progress Notes (Signed)
HPI: FU permanent atrial fibrillation, MS and rheumatic heart disease. She has had prior balloon valvuloplasty at [facility] in June 2000. We previously referred her to [facility]. She had a stress echo and was felt to have mild to moderate MS and medical therapy was recommended. Repeat valvuloplasty in the future was felt to be problematic as it may increase MR. Carotid Dopplers in November of 2013 were normal. Last echo December 2015 showed ejection fraction 55-60% and grade 1 diastolic dysfunction. Filling pressures were elevated. There was mild mitral stenosis with a mean gradient of 6 mmHg and mild to moderate mitral regurgitation. Moderate left atrial enlargement and moderate tricuspid regurgitation. Since I last saw her, She has mild dyspnea on exertion but no orthopnea, PND, pedal edema, syncope or chest pain. Approximately 3 weeks ago she had palpitations for approximately 20 minutes. She described her heart racing. Symptoms subsequent resolved and she's had no further episodes.  Current Outpatient Prescriptions  Medication Sig Dispense Refill  . buPROPion (WELLBUTRIN XL) 300 MG 24 hr tablet TAKE 1 TABLET DAILY 90 tablet 3  . calcium carbonate (OS-CAL) 600 MG TABS Take 600 mg by mouth daily.     . Cholecalciferol (VITAMIN D) 2000 UNITS CAPS Take 1 capsule by mouth daily.    Marland Kitchen diltiazem (CARDIZEM CD) 240 MG 24 hr capsule TAKE 1 CAPSULE DAILY 90 capsule 0  . levothyroxine (SYNTHROID) 112 MCG tablet Take 1 tablet by mouth daily.    . Multiple Vitamins-Minerals (MULTIVITAMIN WITH MINERALS) tablet Take 1 tablet by mouth daily.     . Omega-3 Fatty Acids (FISH OIL) 1000 MG CAPS Take 1 capsule by mouth daily.     Marland Kitchen warfarin (COUMADIN) 2.5 MG tablet Take 2.5 mg by mouth daily.      No current facility-administered medications for this visit.     Past Medical History  Diagnosis Date  . Mitral stenosis 08/26/2008  . MITRAL REGURGITATION 08/26/2008  . RHEUMATIC HEART DISEASE 08/26/2008  .  Atrial fibrillation ([place]) 08/26/2008  . Depression   . Hematuria     workup by Dr. Diona Fanti  . Thyroid nodule     Past Surgical History  Procedure Laterality Date  . Percutaneous balloon valvuloplasty  10/1998    [facility]  . Cesarean section    . Tonsillectomy  1956  . Screw in ankles  1989    right  . Colonoscopy    . Cardioversion      2000  . Thyroidectomy  05/30/2012    Procedure: THYROIDECTOMY;  Surgeon: Earnstine Regal, MD;  Location: [facility];  Service: General;  Laterality: N/A;  total thyroidectomy  . Tubal ligation      Social History   Social History  . Marital Status: Married    Spouse Name: N/A  . Number of Children: 2  . Years of Education: N/A   Occupational History  . branch office administrator    Social History Main Topics  . Smoking status: Never Smoker   . Smokeless tobacco: Never Used  . Alcohol Use: 0.6 oz/week    1 Glasses of wine per week     Comment: occas glass of wine weekly  . Drug Use: No  . Sexual Activity:    Partners: Male    Birth Control/ Protection: Surgical, Post-menopausal     Comment: BTL   Other Topics Concern  . Not on file   Social History Narrative    Family History  Problem Relation Age of  Onset  . Lung cancer Father   . Cancer Father     lung  . Heart disease Mother     S/P CABG  . Cancer Brother     brain  . Heart disease Maternal Grandmother   . Hypertension Sister     ROS: no fevers or chills, productive cough, hemoptysis, dysphasia, odynophagia, melena, hematochezia, dysuria, hematuria, rash, seizure activity, orthopnea, PND, pedal edema, claudication. Remaining systems are negative.  Physical Exam: Well-developed well-nourished in no acute distress.  Skin is warm and dry.  HEENT is normal.  Neck is supple.  Chest is clear to auscultation with normal expansion.  Cardiovascular exam is irregular Abdominal exam nontender or distended. No masses palpated. Extremities show no edema. neuro grossly  intact  ECG Atrial fibrillationAt a rate of 97. Cannot rule out prior septal infarct. Nonspecific ST changes.

## 2015-07-07 ENCOUNTER — Encounter: Payer: Self-pay | Admitting: Cardiology

## 2015-07-07 ENCOUNTER — Ambulatory Visit (INDEPENDENT_AMBULATORY_CARE_PROVIDER_SITE_OTHER): Payer: Medicare HMO | Admitting: Cardiology

## 2015-07-07 DIAGNOSIS — I059 Rheumatic mitral valve disease, unspecified: Secondary | ICD-10-CM | POA: Diagnosis not present

## 2015-07-07 DIAGNOSIS — I4891 Unspecified atrial fibrillation: Secondary | ICD-10-CM | POA: Diagnosis not present

## 2015-07-07 MED ORDER — DILTIAZEM HCL ER COATED BEADS 240 MG PO CP24: ORAL_CAPSULE | ORAL | Status: DC

## 2015-07-07 NOTE — Patient Instructions (Signed)

## 2015-07-07 NOTE — Assessment & Plan Note (Signed)
Plan repeat echocardiogram. Patient understands she may require mitral valve replacement in the future. She has had prior valvuloplasty. Continue SBE prophylaxis.

## 2015-07-07 NOTE — Assessment & Plan Note (Signed)
Continue Cardizem for rate control. Continue Coumadin. Hemoglobin is monitored by primary care. She is not a candidate for DOACs given valvular heart disease. If she has further episodes of palpitations in the future we will consider an event monitor to further evaluate.

## 2015-07-20 ENCOUNTER — Other Ambulatory Visit: Payer: Self-pay | Admitting: Nurse Practitioner

## 2015-07-20 MED ORDER — BUPROPION HCL ER (XL) 300 MG PO TB24: ORAL_TABLET | ORAL | Status: DC

## 2015-07-20 NOTE — Telephone Encounter (Signed)
Medication refill request: Bupropion 300 mg Last AEX:  04/27/2015 with PG Next AEX: 05/18/2016 with PG Last MMG (if hormonal medication request): n/A  Refill authorized: #90/2

## 2015-07-20 NOTE — Telephone Encounter (Signed)
Patient notified that rx has been sent to pharmacy  

## 2015-07-20 NOTE — Telephone Encounter (Signed)
Patient is requesting a 90 day supply for Bupropion. She is using [facility] in [place]. [phone]

## 2015-07-25 ENCOUNTER — Ambulatory Visit (HOSPITAL_COMMUNITY): Payer: Medicare HMO | Attending: Cardiology

## 2015-07-25 ENCOUNTER — Other Ambulatory Visit: Payer: Self-pay

## 2015-07-25 DIAGNOSIS — I052 Rheumatic mitral stenosis with insufficiency: Secondary | ICD-10-CM | POA: Insufficient documentation

## 2015-07-25 DIAGNOSIS — I517 Cardiomegaly: Secondary | ICD-10-CM | POA: Insufficient documentation

## 2015-07-25 DIAGNOSIS — I059 Rheumatic mitral valve disease, unspecified: Secondary | ICD-10-CM

## 2015-07-25 DIAGNOSIS — I071 Rheumatic tricuspid insufficiency: Secondary | ICD-10-CM | POA: Diagnosis not present

## 2015-08-02 DIAGNOSIS — D225 Melanocytic nevi of trunk: Secondary | ICD-10-CM | POA: Diagnosis not present

## 2015-08-24 DIAGNOSIS — L821 Other seborrheic keratosis: Secondary | ICD-10-CM | POA: Diagnosis not present

## 2015-08-24 DIAGNOSIS — L905 Scar conditions and fibrosis of skin: Secondary | ICD-10-CM | POA: Diagnosis not present

## 2015-08-24 DIAGNOSIS — D1801 Hemangioma of skin and subcutaneous tissue: Secondary | ICD-10-CM | POA: Diagnosis not present

## 2015-08-24 DIAGNOSIS — D225 Melanocytic nevi of trunk: Secondary | ICD-10-CM | POA: Diagnosis not present

## 2015-08-24 DIAGNOSIS — D485 Neoplasm of uncertain behavior of skin: Secondary | ICD-10-CM | POA: Diagnosis not present

## 2015-08-31 DIAGNOSIS — I4891 Unspecified atrial fibrillation: Secondary | ICD-10-CM | POA: Diagnosis not present

## 2015-10-04 DIAGNOSIS — I4891 Unspecified atrial fibrillation: Secondary | ICD-10-CM | POA: Diagnosis not present

## 2015-11-24 DIAGNOSIS — I4891 Unspecified atrial fibrillation: Secondary | ICD-10-CM | POA: Diagnosis not present

## 2015-12-15 DIAGNOSIS — I4891 Unspecified atrial fibrillation: Secondary | ICD-10-CM | POA: Diagnosis not present

## 2016-01-05 DIAGNOSIS — I4891 Unspecified atrial fibrillation: Secondary | ICD-10-CM | POA: Diagnosis not present

## 2016-01-16 DIAGNOSIS — I4891 Unspecified atrial fibrillation: Secondary | ICD-10-CM | POA: Diagnosis not present

## 2016-02-03 DIAGNOSIS — I4891 Unspecified atrial fibrillation: Secondary | ICD-10-CM | POA: Diagnosis not present

## 2016-02-13 DIAGNOSIS — I4891 Unspecified atrial fibrillation: Secondary | ICD-10-CM | POA: Diagnosis not present

## 2016-02-27 DIAGNOSIS — I4891 Unspecified atrial fibrillation: Secondary | ICD-10-CM | POA: Diagnosis not present

## 2016-03-15 DIAGNOSIS — R69 Illness, unspecified: Secondary | ICD-10-CM | POA: Diagnosis not present

## 2016-03-26 DIAGNOSIS — I4891 Unspecified atrial fibrillation: Secondary | ICD-10-CM | POA: Diagnosis not present

## 2016-04-03 DIAGNOSIS — R69 Illness, unspecified: Secondary | ICD-10-CM | POA: Diagnosis not present

## 2016-05-09 DIAGNOSIS — E890 Postprocedural hypothyroidism: Secondary | ICD-10-CM | POA: Diagnosis not present

## 2016-05-09 DIAGNOSIS — I4891 Unspecified atrial fibrillation: Secondary | ICD-10-CM | POA: Diagnosis not present

## 2016-05-15 DIAGNOSIS — R69 Illness, unspecified: Secondary | ICD-10-CM | POA: Diagnosis not present

## 2016-05-18 ENCOUNTER — Ambulatory Visit: Payer: Medicare HMO | Admitting: Nurse Practitioner

## 2016-06-06 DIAGNOSIS — I4891 Unspecified atrial fibrillation: Secondary | ICD-10-CM | POA: Diagnosis not present

## 2016-06-19 ENCOUNTER — Other Ambulatory Visit: Payer: Self-pay | Admitting: Family Medicine

## 2016-06-19 DIAGNOSIS — Z1231 Encounter for screening mammogram for malignant neoplasm of breast: Secondary | ICD-10-CM

## 2016-06-20 DIAGNOSIS — I4891 Unspecified atrial fibrillation: Secondary | ICD-10-CM | POA: Diagnosis not present

## 2016-06-22 ENCOUNTER — Ambulatory Visit: Payer: Medicare HMO | Admitting: Nurse Practitioner

## 2016-07-02 ENCOUNTER — Other Ambulatory Visit: Payer: Self-pay | Admitting: Cardiology

## 2016-07-03 ENCOUNTER — Ambulatory Visit: Payer: Medicare HMO

## 2016-07-04 DIAGNOSIS — I4891 Unspecified atrial fibrillation: Secondary | ICD-10-CM | POA: Diagnosis not present

## 2016-07-05 ENCOUNTER — Ambulatory Visit: Payer: Medicare HMO

## 2016-07-06 NOTE — Progress Notes (Signed)
HPI: FU permanent atrial fibrillation, MS and rheumatic heart disease. She has had prior balloon valvuloplasty at [facility] in June 2000. We previously referred her to [facility]. She had a stress echo and was felt to have mild to moderate MS and medical therapy was recommended. Repeat valvuloplasty in the future was felt to be problematic as it may increase MR. Carotid Dopplers in November of 2013 were normal. Echo 3/17 showed normal LV function; moderate MS with mean gradient 9 mmHg and mild MR, biatrial enlargement. Since I last saw her, the patient has dyspnea with more extreme activities but not with routine activities. It is relieved with rest. It is not associated with chest pain. There is no orthopnea, PND or pedal edema. There is no syncope. There is no exertional chest pain. Occasional brief palpitations with no associated symptoms. Described as heart racing for approximately 5 minutes.   Current Outpatient Prescriptions  Medication Sig Dispense Refill  . buPROPion (WELLBUTRIN XL) 300 MG 24 hr tablet TAKE 1 TABLET DAILY 90 tablet 2  . calcium carbonate (OS-CAL) 600 MG TABS Take 600 mg by mouth daily.     Marland Kitchen CARTIA XT 240 MG 24 hr capsule TAKE 1 CAPSULE BY MOUTH DAILY 90 capsule 1  . Cholecalciferol (VITAMIN D) 2000 UNITS CAPS Take 1 capsule by mouth daily.    . Multiple Vitamins-Minerals (MULTIVITAMIN WITH MINERALS) tablet Take 1 tablet by mouth daily.     . Omega-3 Fatty Acids (FISH OIL) 1000 MG CAPS Take 1 capsule by mouth daily.     Marland Kitchen warfarin (COUMADIN) 2.5 MG tablet Take 2.5 mg by mouth daily.      No current facility-administered medications for this visit.      Past Medical History:  Diagnosis Date  . Atrial fibrillation ([place]) 08/26/2008  . Depression   . Hematuria    workup by Dr. Diona Fanti  . MITRAL REGURGITATION 08/26/2008  . Mitral stenosis 08/26/2008  . RHEUMATIC HEART DISEASE 08/26/2008  . Thyroid nodule     Past Surgical History:  Procedure Laterality Date    . CARDIOVERSION     2000  . CESAREAN SECTION    . COLONOSCOPY    . PERCUTANEOUS BALLOON VALVULOPLASTY  10/1998   [place]  . screw in ankles  1989   right  . THYROIDECTOMY  05/30/2012   Procedure: THYROIDECTOMY;  Surgeon: Earnstine Regal, MD;  Location: [facility];  Service: General;  Laterality: N/A;  total thyroidectomy  . TONSILLECTOMY  1956  . TUBAL LIGATION      Social History   Social History  . Marital status: Married    Spouse name: N/A  . Number of children: 2  . Years of education: N/A   Occupational History  . branch office administrator [employer]   Social History Main Topics  . Smoking status: Never Smoker  . Smokeless tobacco: Never Used  . Alcohol use 0.6 oz/week    1 Glasses of wine per week     Comment: occas glass of wine weekly  . Drug use: No  . Sexual activity: Yes    Partners: Male    Birth control/ protection: Surgical, Post-menopausal     Comment: BTL   Other Topics Concern  . Not on file   Social History Narrative  . No narrative on file    Family History  Problem Relation Age of Onset  . Lung cancer Father   . Cancer Father     lung  .  Heart disease Mother     S/P CABG  . Cancer Brother     brain  . Heart disease Maternal Grandmother   . Hypertension Sister     ROS: no fevers or chills, productive cough, hemoptysis, dysphasia, odynophagia, melena, hematochezia, dysuria, hematuria, rash, seizure activity, orthopnea, PND, pedal edema, claudication. Remaining systems are negative.  Physical Exam: Well-developed well-nourished in no acute distress.  Skin is warm and dry.  HEENT is normal.  Neck is supple.  Chest is clear to auscultation with normal expansion.  Cardiovascular exam is irregular Abdominal exam nontender or distended. No masses palpated. Extremities show no edema. neuro grossly intact  ECG- Atrial fibrillation at a rate of 83. Cannot rule out prior septal infarct; personally reviewed  A/P  1 Permanent  atrial fibrillation-patient remains in atrial fibrillation today. Continue Cardizem for rate control. Continue Coumadin. Hemoglobin is monitored by primary care.  2 mitral stenosis-plan repeat echocardiogram. Continue SBE prophylaxis. Patient may require mitral valve replacement in the future.  3 palpitations-these are infrequent lasting approximately 5 minutes. If they worsen we will plan an event monitor to further assess.  Kirk Ruths, MD

## 2016-07-12 ENCOUNTER — Ambulatory Visit (INDEPENDENT_AMBULATORY_CARE_PROVIDER_SITE_OTHER): Payer: Medicare HMO | Admitting: Cardiology

## 2016-07-12 ENCOUNTER — Encounter: Payer: Self-pay | Admitting: Cardiology

## 2016-07-12 VITALS — BP 116/70 | HR 83 | Ht 65.0 in | Wt 181.0 lb

## 2016-07-12 DIAGNOSIS — I482 Chronic atrial fibrillation, unspecified: Secondary | ICD-10-CM

## 2016-07-12 DIAGNOSIS — I050 Rheumatic mitral stenosis: Secondary | ICD-10-CM

## 2016-07-12 MED ORDER — DILTIAZEM HCL ER COATED BEADS 240 MG PO CP24: 240.0000 mg | ORAL_CAPSULE | Freq: Every day | ORAL | 3 refills | Status: DC

## 2016-07-12 NOTE — Patient Instructions (Signed)
Medication Instructions: Dr Stanford Breed recommends that you continue on your current medications as directed. Please refer to the Current Medication list given to you today.  Labwork: NONE ORDERED  Testing/Procedures: 1. Echocardiogram - Your physician has requested that you have an echocardiogram. Echocardiography is a painless test that uses sound waves to create images of your heart. It provides your doctor with information about the size and shape of your heart and how well your heart's chambers and valves are working. This procedure takes approximately one hour. There are no restrictions for this procedure. This will be performed at our [facility] location - [address].  Follow-up: Dr Stanford Breed recommends that you schedule a follow-up appointment in 1 year. You will receive a reminder letter in the mail two months in advance. If you don't receive a letter, please call our office to schedule the follow-up appointment.  If you need a refill on your cardiac medications before your next appointment, please call your pharmacy.

## 2016-07-24 ENCOUNTER — Ambulatory Visit
Admission: RE | Admit: 2016-07-24 | Discharge: 2016-07-24 | Disposition: A | Discharge status: Home / Self Care | Payer: Medicare HMO | Source: Ambulatory Visit | Attending: Family Medicine | Admitting: Family Medicine

## 2016-07-24 DIAGNOSIS — Z1231 Encounter for screening mammogram for malignant neoplasm of breast: Secondary | ICD-10-CM | POA: Diagnosis not present

## 2016-07-31 ENCOUNTER — Other Ambulatory Visit: Payer: Self-pay

## 2016-07-31 ENCOUNTER — Ambulatory Visit (HOSPITAL_COMMUNITY): Payer: Medicare HMO | Attending: Cardiology

## 2016-07-31 DIAGNOSIS — R002 Palpitations: Secondary | ICD-10-CM | POA: Insufficient documentation

## 2016-07-31 DIAGNOSIS — Z8249 Family history of ischemic heart disease and other diseases of the circulatory system: Secondary | ICD-10-CM | POA: Diagnosis not present

## 2016-07-31 DIAGNOSIS — I4891 Unspecified atrial fibrillation: Secondary | ICD-10-CM | POA: Diagnosis not present

## 2016-07-31 DIAGNOSIS — I050 Rheumatic mitral stenosis: Secondary | ICD-10-CM

## 2016-07-31 DIAGNOSIS — I081 Rheumatic disorders of both mitral and tricuspid valves: Secondary | ICD-10-CM | POA: Diagnosis not present

## 2016-08-01 DIAGNOSIS — I4891 Unspecified atrial fibrillation: Secondary | ICD-10-CM | POA: Diagnosis not present

## 2016-08-01 DIAGNOSIS — E890 Postprocedural hypothyroidism: Secondary | ICD-10-CM | POA: Diagnosis not present

## 2016-08-15 DIAGNOSIS — Z23 Encounter for immunization: Secondary | ICD-10-CM | POA: Diagnosis not present

## 2016-08-15 DIAGNOSIS — Z1159 Encounter for screening for other viral diseases: Secondary | ICD-10-CM | POA: Diagnosis not present

## 2016-08-15 DIAGNOSIS — E559 Vitamin D deficiency, unspecified: Secondary | ICD-10-CM | POA: Diagnosis not present

## 2016-08-15 DIAGNOSIS — I482 Chronic atrial fibrillation: Secondary | ICD-10-CM | POA: Diagnosis not present

## 2016-08-15 DIAGNOSIS — Z0000 Encounter for general adult medical examination without abnormal findings: Secondary | ICD-10-CM | POA: Diagnosis not present

## 2016-08-15 DIAGNOSIS — E890 Postprocedural hypothyroidism: Secondary | ICD-10-CM | POA: Diagnosis not present

## 2016-08-29 DIAGNOSIS — I4891 Unspecified atrial fibrillation: Secondary | ICD-10-CM | POA: Diagnosis not present

## 2016-09-26 DIAGNOSIS — I4891 Unspecified atrial fibrillation: Secondary | ICD-10-CM | POA: Diagnosis not present

## 2016-10-10 DIAGNOSIS — I4891 Unspecified atrial fibrillation: Secondary | ICD-10-CM | POA: Diagnosis not present

## 2016-11-06 DIAGNOSIS — I4891 Unspecified atrial fibrillation: Secondary | ICD-10-CM | POA: Diagnosis not present

## 2016-11-12 DIAGNOSIS — R69 Illness, unspecified: Secondary | ICD-10-CM | POA: Diagnosis not present

## 2016-11-22 DIAGNOSIS — R69 Illness, unspecified: Secondary | ICD-10-CM | POA: Diagnosis not present

## 2016-12-11 DIAGNOSIS — I4891 Unspecified atrial fibrillation: Secondary | ICD-10-CM | POA: Diagnosis not present

## 2017-01-09 DIAGNOSIS — I4891 Unspecified atrial fibrillation: Secondary | ICD-10-CM | POA: Diagnosis not present

## 2017-02-12 DIAGNOSIS — I4891 Unspecified atrial fibrillation: Secondary | ICD-10-CM | POA: Diagnosis not present

## 2017-02-12 DIAGNOSIS — Z23 Encounter for immunization: Secondary | ICD-10-CM | POA: Diagnosis not present

## 2017-05-01 DIAGNOSIS — J40 Bronchitis, not specified as acute or chronic: Secondary | ICD-10-CM | POA: Diagnosis not present

## 2017-05-01 DIAGNOSIS — I4891 Unspecified atrial fibrillation: Secondary | ICD-10-CM | POA: Diagnosis not present

## 2017-05-01 DIAGNOSIS — Z7901 Long term (current) use of anticoagulants: Secondary | ICD-10-CM | POA: Diagnosis not present

## 2017-05-07 DIAGNOSIS — S62109A Fracture of unspecified carpal bone, unspecified wrist, initial encounter for closed fracture: Secondary | ICD-10-CM

## 2017-05-07 HISTORY — DX: Fracture of unspecified carpal bone, unspecified wrist, initial encounter for closed fracture: S62.109A

## 2017-06-20 ENCOUNTER — Telehealth: Payer: Self-pay | Admitting: Cardiology

## 2017-06-20 NOTE — Telephone Encounter (Signed)
New message    Patient calling reading about eleberberry juice and wants to know can she take it with her current medication.

## 2017-06-20 NOTE — Telephone Encounter (Signed)
Spoke with pt, aware of pharm md recommendations. 

## 2017-06-20 NOTE — Telephone Encounter (Signed)
Noted patient on warfarin not follow by [place]. Elderberry okay to use but should check INR 3-5 days after initiation.  No severe interaction with warfarin expected but little data is available at this time.

## 2017-07-02 ENCOUNTER — Other Ambulatory Visit: Payer: Self-pay | Admitting: Cardiology

## 2017-09-16 ENCOUNTER — Other Ambulatory Visit: Payer: Self-pay | Admitting: Family Medicine

## 2017-09-16 DIAGNOSIS — Z1231 Encounter for screening mammogram for malignant neoplasm of breast: Secondary | ICD-10-CM

## 2017-10-07 ENCOUNTER — Ambulatory Visit
Admission: RE | Admit: 2017-10-07 | Discharge: 2017-10-07 | Disposition: A | Discharge status: Home / Self Care | Payer: Medicare HMO | Source: Ambulatory Visit | Attending: Family Medicine | Admitting: Family Medicine

## 2017-10-07 DIAGNOSIS — Z1231 Encounter for screening mammogram for malignant neoplasm of breast: Secondary | ICD-10-CM

## 2017-11-05 ENCOUNTER — Encounter (INDEPENDENT_AMBULATORY_CARE_PROVIDER_SITE_OTHER): Payer: Self-pay | Admitting: Orthopedic Surgery

## 2017-11-05 ENCOUNTER — Ambulatory Visit (INDEPENDENT_AMBULATORY_CARE_PROVIDER_SITE_OTHER): Payer: Medicare HMO | Admitting: Orthopedic Surgery

## 2017-11-05 DIAGNOSIS — M25532 Pain in left wrist: Secondary | ICD-10-CM | POA: Diagnosis not present

## 2017-11-05 NOTE — Progress Notes (Signed)
Office Visit Note   Patient: Lorraine Kemp           Date of Birth: 1950-11-11           MRN: [id number] Visit Date: 11/05/2017              Requested by: Chesley Noon, MD [address] [place], [place] [address] PCP: Chesley Noon, MD  Chief Complaint  Patient presents with  . Left Wrist - Injury    [address] on concrete on 6/30      HPI: Patient is a 67 year old woman who states that she fell striking her face as well as an outstretched left arm.  Initial radiographs showed a avulsion fracture off the triquetrum.  Patient is seen today for initial evaluation for the left wrist.  She is currently in a Velcro splint.  Assessment & Plan: Visit Diagnoses:  1. Pain in left wrist     Plan: Patient will continue using the Velcro splint.  I feel the avulsion fracture should heal uneventfully.  She is given instructions for range of motion as well as ice and elevation.  Continue with a Velcro splint.  3 view radiographs of the left wrist at follow-up.  Follow-Up Instructions: Return in about 3 weeks (around 11/26/2017).   Ortho Exam  Patient is alert, oriented, no adenopathy, well-dressed, normal affect, normal respiratory effort. Semination patient has abrasion over her lip and nose.  She has some scrapes on the hyperthenar and thenar eminence of her hand.  The scaphoid scapholunate and TFCC are essentially nontender to palpation she does have tenderness to palpation dorsally over the carpal bones of the wrist.  The distal radius and ulnar are nontender to palpation.  Review of the radiographs shows a dorsal avulsion fracture of the triquetrum there is no sign of a scaphoid fracture.  Imaging: No results found. No images are attached to the encounter.  Labs: Lab Results  Component Value Date   REPTSTATUS 05/22/2012 FINAL 05/20/2012   CULT  05/20/2012    NO STAPHYLOCOCCUS AUREUS ISOLATED Note: No MRSA Isolated   LABORGA NO GROWTH 04/06/2014     Lab Results    Component Value Date   ALBUMIN 4.3 04/06/2014   ALBUMIN 4.3 02/08/2011    There is no height or weight on file to calculate BMI.  Orders:  No orders of the defined types were placed in this encounter.  No orders of the defined types were placed in this encounter.    Procedures: No procedures performed  Clinical Data: No additional findings.  ROS:  All other systems negative, except as noted in the HPI. Review of Systems  Objective: Vital Signs: LMP 05/07/2002 (Approximate)   Specialty Comments:  No specialty comments available.  PMFS History: Patient Active Problem List   Diagnosis Date Noted  . 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 Dizziness 03/20/2012  . Special screening for malignant neoplasms, colon 03/19/2011  . DYSPNEA 02/15/2009  . MITRAL STENOSIS 08/26/2008  . MITRAL REGURGITATION 08/26/2008  . RHEUMATIC HEART DISEASE 08/26/2008  . ATRIAL FIBRILLATION 08/26/2008   Past Medical History:  Diagnosis Date  . Atrial fibrillation ([place]) 08/26/2008  . Depression   . Hematuria    workup by Dr. Diona Fanti  . MITRAL REGURGITATION 08/26/2008  . Mitral stenosis 08/26/2008  . RHEUMATIC HEART DISEASE 08/26/2008  . Thyroid nodule     Family History  Problem Relation Age of Onset  . Lung  cancer Father   . Cancer Father        lung  . Heart disease Mother        S/P CABG  . Cancer Brother        brain  . Hypertension Sister   . Heart disease Maternal Grandmother     Past Surgical History:  Procedure Laterality Date  . CARDIOVERSION     2000  . CESAREAN SECTION    . COLONOSCOPY    . PERCUTANEOUS BALLOON VALVULOPLASTY  10/1998   [place]  . screw in ankles  1989   right  . THYROIDECTOMY  05/30/2012   Procedure: THYROIDECTOMY;  Surgeon: Earnstine Regal, MD;  Location: [facility];  Service: General;  Laterality: N/A;  total thyroidectomy  . TONSILLECTOMY  1956  . TUBAL LIGATION      Social History   Occupational History  . Occupation: Airline pilot    Employer: [employer]  Tobacco Use  . Smoking status: Never Smoker  . Smokeless tobacco: Never Used  Substance and Sexual Activity  . Alcohol use: Yes    Alcohol/week: 0.6 oz    Types: 1 Glasses of wine per week    Comment: occas glass of wine weekly  . Drug use: No  . Sexual activity: Yes    Partners: Male    Birth control/protection: Surgical, Post-menopausal    Comment: BTL

## 2017-11-25 ENCOUNTER — Ambulatory Visit (INDEPENDENT_AMBULATORY_CARE_PROVIDER_SITE_OTHER): Payer: Medicare HMO | Admitting: Orthopedic Surgery

## 2017-11-25 ENCOUNTER — Ambulatory Visit (INDEPENDENT_AMBULATORY_CARE_PROVIDER_SITE_OTHER): Payer: Medicare HMO

## 2017-11-25 ENCOUNTER — Encounter (INDEPENDENT_AMBULATORY_CARE_PROVIDER_SITE_OTHER): Payer: Self-pay | Admitting: Orthopedic Surgery

## 2017-11-25 VITALS — Ht 65.0 in | Wt 168.0 lb

## 2017-11-25 DIAGNOSIS — M25532 Pain in left wrist: Secondary | ICD-10-CM

## 2017-11-26 ENCOUNTER — Encounter (INDEPENDENT_AMBULATORY_CARE_PROVIDER_SITE_OTHER): Payer: Self-pay | Admitting: Orthopedic Surgery

## 2017-11-26 NOTE — Progress Notes (Signed)
Office Visit Note   Patient: Lorraine Kemp           Date of Birth: [birth year]           MRN: [id number] Visit Date: 11/25/2017              Requested by: Chesley Noon, MD [address] [place], [place] [address] PCP: Chesley Noon, MD  Chief Complaint  Patient presents with  . Left Wrist - Fracture, Follow-up      HPI: Patient states that her wrist is feeling much better she has been wearing the Velcro wrist splint.  She states she took the splint off yesterday and had some pain but she states she is much better than her initial injury.  Assessment & Plan: Visit Diagnoses:  1. Pain in left wrist     Plan: Continue using the splint as needed increase her activities as tolerated no restrictions.  Follow-Up Instructions: Return if symptoms worsen or fail to improve.   Ortho Exam  Patient is alert, oriented, no adenopathy, well-dressed, normal affect, normal respiratory effort. Examination patient has no tenderness to palpation over the scaphoid or scapholunate.  The TFCC is tender to palpation ulnar grind is painful she does have a prominent ulnar styloid.  The dorsum of the wrist is nontender patient shows good improvement from her previous exam.  Imaging: No results found. No images are attached to the encounter.  Labs: Lab Results  Component Value Date   REPTSTATUS 05/22/2012 FINAL 05/20/2012   CULT  05/20/2012    NO STAPHYLOCOCCUS AUREUS ISOLATED Note: No MRSA Isolated   LABORGA NO GROWTH 04/06/2014     Lab Results  Component Value Date   ALBUMIN 4.3 04/06/2014   ALBUMIN 4.3 02/08/2011    Body mass index is 27.96 kg/m.  Orders:  Orders Placed This Encounter  Procedures  . XR Wrist Complete Left   No orders of the defined types were placed in this encounter.    Procedures: No procedures performed  Clinical Data: No additional findings.  ROS:  All other systems negative, except as noted in the HPI. Review of  Systems  Objective: Vital Signs: Ht 5\' 5"  (1.651 m)   Wt 168 lb (76.2 kg)   LMP 05/07/2002 (Approximate)   BMI 27.96 kg/m   Specialty Comments:  No specialty comments available.  PMFS History: Patient Active Problem List   Diagnosis Date Noted  . 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 Dizziness 03/20/2012  . Special screening for malignant neoplasms, colon 03/19/2011  . DYSPNEA 02/15/2009  . MITRAL STENOSIS 08/26/2008  . MITRAL REGURGITATION 08/26/2008  . RHEUMATIC HEART DISEASE 08/26/2008  . ATRIAL FIBRILLATION 08/26/2008   Past Medical History:  Diagnosis Date  . Atrial fibrillation ([place]) 08/26/2008  . Depression   . Hematuria    workup by Dr. Diona Fanti  . MITRAL REGURGITATION 08/26/2008  . Mitral stenosis 08/26/2008  . RHEUMATIC HEART DISEASE 08/26/2008  . Thyroid nodule     Family History  Problem Relation Age of Onset  . Lung cancer Father   . Cancer Father        lung  . Heart disease Mother        S/P CABG  . Cancer Brother        brain  . Hypertension Sister   . Heart disease Maternal Grandmother     Past Surgical History:  Procedure Laterality Date  . CARDIOVERSION  2000  . CESAREAN SECTION    . COLONOSCOPY    . PERCUTANEOUS BALLOON VALVULOPLASTY  10/1998   [place]  . screw in ankles  1989   right  . THYROIDECTOMY  05/30/2012   Procedure: THYROIDECTOMY;  Surgeon: Earnstine Regal, MD;  Location: [facility];  Service: General;  Laterality: N/A;  total thyroidectomy  . TONSILLECTOMY  1956  . TUBAL LIGATION     Social History   Occupational History  . Occupation: Airline pilot    Employer: [employer]  Tobacco Use  . Smoking status: Never Smoker  . Smokeless tobacco: Never Used  Substance and Sexual Activity  . Alcohol use: Yes    Alcohol/week: 0.6 oz    Types: 1 Glasses of wine per week    Comment: occas glass of wine weekly  . Drug use: No  . Sexual  activity: Yes    Partners: Male    Birth control/protection: Surgical, Post-menopausal    Comment: BTL

## 2017-11-27 ENCOUNTER — Ambulatory Visit (INDEPENDENT_AMBULATORY_CARE_PROVIDER_SITE_OTHER): Payer: Medicare HMO | Admitting: Orthopedic Surgery

## 2017-12-04 NOTE — Progress Notes (Signed)
HPI: FU permanent atrial fibrillation, MS and rheumatic heart disease. She has had prior balloon valvuloplasty at [facility] in June 2000. We previously referred her to [facility]. She had a stress echo and was felt to have mild to moderate MS and medical therapy was recommended. Repeat valvuloplasty in the future was felt to be problematic as it may increase MR. Carotid Dopplers in November of 2013 were normal. Last echocardiogram March 2018 showed normal LV function, trace aortic insufficiency, rheumatic mitral valve with moderate to severe mitral stenosis, mean gradient 8 mmHg, valve area by pressure half-time 1.4 cm, mild mitral regurgitation, biatrial enlargement.  Since I last saw her,  she notices some increased dyspnea on exertion that she attributes to deconditioning.  She denies orthopnea, PND, pedal edema, chest pain, palpitations or syncope.  No bleeding.  Current Outpatient Medications  Medication Sig Dispense Refill  . amoxicillin (AMOXIL) 500 MG capsule TAKE 4 CAPSULES 1 HOUR BEFORE APPT  2  . buPROPion (WELLBUTRIN XL) 300 MG 24 hr tablet TAKE 1 TABLET DAILY 90 tablet 2  . calcium carbonate (OS-CAL) 600 MG TABS Take 600 mg by mouth daily.     . Cholecalciferol (VITAMIN D) 2000 UNITS CAPS Take 1 capsule by mouth daily.    Marland Kitchen diltiazem (CARDIZEM CD) 240 MG 24 hr capsule TAKE 1 CAPSULE (240 MG TOTAL) BY MOUTH DAILY. 90 capsule 3  . HYDROcodone-acetaminophen (NORCO/VICODIN) 5-325 MG tablet     . levothyroxine (SYNTHROID, LEVOTHROID) 125 MCG tablet Take 125 mcg by mouth daily.  6  . Multiple Vitamins-Minerals (MULTIVITAMIN WITH MINERALS) tablet Take 1 tablet by mouth daily.     . Omega-3 Fatty Acids (FISH OIL) 1000 MG CAPS Take 1 capsule by mouth daily.     Marland Kitchen warfarin (COUMADIN) 2.5 MG tablet Take 2.5 mg by mouth daily.      No current facility-administered medications for this visit.      Past Medical History:  Diagnosis Date  . Atrial fibrillation ([place]) 08/26/2008  .  Depression   . Hematuria    workup by Dr. Diona Fanti  . MITRAL REGURGITATION 08/26/2008  . Mitral stenosis 08/26/2008  . RHEUMATIC HEART DISEASE 08/26/2008  . Thyroid nodule     Past Surgical History:  Procedure Laterality Date  . CARDIOVERSION     2000  . CESAREAN SECTION    . COLONOSCOPY    . PERCUTANEOUS BALLOON VALVULOPLASTY  10/1998   [place]  . screw in ankles  1989   right  . THYROIDECTOMY  05/30/2012   Procedure: THYROIDECTOMY;  Surgeon: Earnstine Regal, MD;  Location: [facility];  Service: General;  Laterality: N/A;  total thyroidectomy  . TONSILLECTOMY  1956  . TUBAL LIGATION      Social History   Socioeconomic History  . Marital status: Married    Spouse name: Not on file  . Number of children: 2  . Years of education: Not on file  . Highest education level: Not on file  Occupational History  . Occupation: Airline pilot    Employer: [employer]  Social Needs  . Financial resource strain: Not on file  . Food insecurity:    Worry: Not on file    Inability: Not on file  . Transportation needs:    Medical: Not on file    Non-medical: Not on file  Tobacco Use  . Smoking status: Never Smoker  . Smokeless tobacco: Never Used  Substance and Sexual Activity  . Alcohol use:  Yes    Alcohol/week: 0.6 oz    Types: 1 Glasses of wine per week    Comment: occas glass of wine weekly  . Drug use: No  . Sexual activity: Yes    Partners: Male    Birth control/protection: Surgical, Post-menopausal    Comment: BTL  Lifestyle  . Physical activity:    Days per week: Not on file    Minutes per session: Not on file  . Stress: Not on file  Relationships  . Social connections:    Talks on phone: Not on file    Gets together: Not on file    Attends religious service: Not on file    Active member of club or organization: Not on file    Attends meetings of clubs or organizations: Not on file    Relationship status: Not on file  . Intimate partner  violence:    Fear of current or ex partner: Not on file    Emotionally abused: Not on file    Physically abused: Not on file    Forced sexual activity: Not on file  Other Topics Concern  . Not on file  Social History Narrative  . Not on file    Family History  Problem Relation Age of Onset  . Lung cancer Father   . Cancer Father        lung  . Heart disease Mother        S/P CABG  . Cancer Brother        brain  . Hypertension Sister   . Heart disease Maternal Grandmother     ROS: no fevers or chills, productive cough, hemoptysis, dysphasia, odynophagia, melena, hematochezia, dysuria, hematuria, rash, seizure activity, orthopnea, PND, pedal edema, claudication. Remaining systems are negative.  Physical Exam: Well-developed well-nourished in no acute distress.  Skin is warm and dry.  HEENT is normal.  Neck is supple.  Chest is clear to auscultation with normal expansion.  Cardiovascular exam is irregular Abdominal exam nontender or distended. No masses palpated. Extremities show no edema. neuro grossly intact  ECG-atrial fibrillation at a rate of 82, cannot rule out prior septal infarct.  Personally reviewed  A/P  1 permanent atrial fibrillation-plan to continue Cardizem for rate control.  Continue Coumadin with goal INR 2-3.  INR is followed by primary care.  2 mitral stenosis with prior balloon valvuloplasty-plan to repeat echocardiogram.  Continue SBE prophylaxis.  May require mitral valve replacement in the future.  3 palpitations-no further symptoms.  We will treat conservatively for now.  Kirk Ruths, MD

## 2017-12-10 ENCOUNTER — Encounter: Payer: Self-pay | Admitting: Cardiology

## 2017-12-10 ENCOUNTER — Ambulatory Visit: Payer: Medicare HMO | Admitting: Cardiology

## 2017-12-10 VITALS — BP 122/74 | HR 82 | Ht 65.0 in | Wt 169.6 lb

## 2017-12-10 DIAGNOSIS — I482 Chronic atrial fibrillation: Secondary | ICD-10-CM | POA: Diagnosis not present

## 2017-12-10 DIAGNOSIS — I050 Rheumatic mitral stenosis: Secondary | ICD-10-CM

## 2017-12-10 DIAGNOSIS — I4821 Permanent atrial fibrillation: Secondary | ICD-10-CM

## 2017-12-10 NOTE — Patient Instructions (Signed)

## 2017-12-12 ENCOUNTER — Other Ambulatory Visit: Payer: Self-pay

## 2017-12-12 ENCOUNTER — Ambulatory Visit (HOSPITAL_COMMUNITY): Payer: Medicare HMO | Attending: Cardiovascular Disease

## 2017-12-12 DIAGNOSIS — I081 Rheumatic disorders of both mitral and tricuspid valves: Secondary | ICD-10-CM | POA: Diagnosis not present

## 2017-12-12 DIAGNOSIS — R0600 Dyspnea, unspecified: Secondary | ICD-10-CM | POA: Insufficient documentation

## 2017-12-12 DIAGNOSIS — I4891 Unspecified atrial fibrillation: Secondary | ICD-10-CM | POA: Insufficient documentation

## 2017-12-12 DIAGNOSIS — I050 Rheumatic mitral stenosis: Secondary | ICD-10-CM | POA: Diagnosis not present

## 2017-12-12 DIAGNOSIS — I051 Rheumatic mitral insufficiency: Secondary | ICD-10-CM | POA: Diagnosis present

## 2017-12-13 ENCOUNTER — Encounter: Payer: Self-pay | Admitting: Cardiology

## 2017-12-16 ENCOUNTER — Encounter: Payer: Self-pay | Admitting: Cardiology

## 2017-12-16 ENCOUNTER — Ambulatory Visit: Payer: Medicare HMO | Admitting: Cardiology

## 2017-12-16 VITALS — BP 142/74 | HR 66 | Ht 65.0 in | Wt 171.0 lb

## 2017-12-16 DIAGNOSIS — I050 Rheumatic mitral stenosis: Secondary | ICD-10-CM

## 2017-12-16 DIAGNOSIS — I4821 Permanent atrial fibrillation: Secondary | ICD-10-CM

## 2017-12-16 DIAGNOSIS — I482 Chronic atrial fibrillation: Secondary | ICD-10-CM

## 2017-12-16 NOTE — Progress Notes (Signed)
HPI: FU permanent atrial fibrillation, MS and rheumatic heart disease. She has had prior balloon valvuloplasty at [facility] in June 2000. We previously referred her to [facility]. She had a stress echo and was felt to have mild to moderate MS and medical therapy was recommended. Repeat valvuloplasty in the future was felt to be problematic as it may increase MR. 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 Since I last saw her,  patient continues to have some dyspnea on exertion particularly with using her arms.  This is new over the past 6 months.  No orthopnea, PND, pedal edema, chest pain or syncope.  Current Outpatient Medications  Medication Sig Dispense Refill  . amoxicillin (AMOXIL) 500 MG capsule TAKE 4 CAPSULES 1 HOUR BEFORE APPT  2  . buPROPion (WELLBUTRIN XL) 300 MG 24 hr tablet TAKE 1 TABLET DAILY 90 tablet 2  . calcium carbonate (OS-CAL) 600 MG TABS Take 600 mg by mouth daily.     . Cholecalciferol (VITAMIN D) 2000 UNITS CAPS Take 1 capsule by mouth daily.    Marland Kitchen diltiazem (CARDIZEM CD) 240 MG 24 hr capsule TAKE 1 CAPSULE (240 MG TOTAL) BY MOUTH DAILY. 90 capsule 3  . HYDROcodone-acetaminophen (NORCO/VICODIN) 5-325 MG tablet     . levothyroxine (SYNTHROID, LEVOTHROID) 125 MCG tablet Take 125 mcg by mouth daily.  6  . Multiple Vitamins-Minerals (MULTIVITAMIN WITH MINERALS) tablet Take 1 tablet by mouth daily.     . Omega-3 Fatty Acids (FISH OIL) 1000 MG CAPS Take 1 capsule by mouth daily.     Marland Kitchen warfarin (COUMADIN) 2.5 MG tablet Take 2.5 mg by mouth daily.      No current facility-administered medications for this visit.      Past Medical History:  Diagnosis Date  . Atrial fibrillation ([place])  08/26/2008  . Depression   . Hematuria    workup by Dr. Diona Fanti  . MITRAL REGURGITATION 08/26/2008  . Mitral stenosis 08/26/2008  . RHEUMATIC HEART DISEASE 08/26/2008  . Thyroid nodule     Past Surgical History:  Procedure Laterality Date  . CARDIOVERSION     2000  . CESAREAN SECTION    . COLONOSCOPY    . PERCUTANEOUS BALLOON VALVULOPLASTY  10/1998   [place]  . screw in ankles  1989   right  . THYROIDECTOMY  05/30/2012   Procedure: THYROIDECTOMY;  Surgeon: Earnstine Regal, MD;  Location: [facility];  Service: General;  Laterality: N/A;  total thyroidectomy  . TONSILLECTOMY  1956  . TUBAL LIGATION      Social History   Socioeconomic History  . Marital status: Married    Spouse name: Not on file  . Number of children: 2  . Years of education: Not on file  . Highest education level: Not on file  Occupational History  . Occupation: Airline pilot    Employer: [employer]  Social Needs  . Financial resource strain: Not on file  . Food insecurity:    Worry: Not on file    Inability: Not on file  . Transportation needs:    Medical: Not on file    Non-medical: Not on file  Tobacco Use  . Smoking status: Never Smoker  . Smokeless tobacco: Never Used  Substance and Sexual  Activity  . Alcohol use: Yes    Alcohol/week: 1.0 standard drinks    Types: 1 Glasses of wine per week    Comment: occas glass of wine weekly  . Drug use: No  . Sexual activity: Yes    Partners: Male    Birth control/protection: Surgical, Post-menopausal    Comment: BTL  Lifestyle  . Physical activity:    Days per week: Not on file    Minutes per session: Not on file  . Stress: Not on file  Relationships  . Social connections:    Talks on phone: Not on file    Gets together: Not on file    Attends religious service: Not on file    Active member of club or organization: Not on file    Attends meetings of clubs or organizations: Not on file    Relationship status: Not on file    . Intimate partner violence:    Fear of current or ex partner: Not on file    Emotionally abused: Not on file    Physically abused: Not on file    Forced sexual activity: Not on file  Other Topics Concern  . Not on file  Social History Narrative  . Not on file    Family History  Problem Relation Age of Onset  . Lung cancer Father   . Cancer Father        lung  . Heart disease Mother        S/P CABG  . Cancer Brother        brain  . Hypertension Sister   . Heart disease Maternal Grandmother     ROS: no fevers or chills, productive cough, hemoptysis, dysphasia, odynophagia, melena, hematochezia, dysuria, hematuria, rash, seizure activity, orthopnea, PND, pedal edema, claudication. Remaining systems are negative.  Physical Exam: Well-developed well-nourished in no acute distress.  Skin is warm and dry.  HEENT is normal.  Neck is supple.  Chest is clear to auscultation with normal expansion.  Cardiovascular exam is irregular Abdominal exam nontender or distended. No masses palpated. Extremities show no edema. neuro grossly intact  A/P  1 permanent atrial fibrillation-plan to continue Cardizem for rate control.  Continue Coumadin.  2 mitral stenosis-repeat echocardiogram shows severe mitral stenosis.  She is more symptomatic with increased dyspnea on exertion for the past 6 months.  This has not been present previously.  I will arrange a transesophageal echocardiogram to further assess mitral valve, mitral stenosis, mitral regurgitation and mitral valve anatomy.  Note she was seen at [facility] previously and balloon valvuloplasty was felt likely not possible as it would exacerbate mitral regurgitation.  If transesophageal echocardiogram suggests severe mitral stenosis we will also likely arrange cardiac catheterization to rule out coronary disease and assess pulmonary pressure.  Would then need to refer for possible mitral valve replacement.  3 history of palpitations-no recent  symptoms.  Kirk Ruths, MD

## 2017-12-16 NOTE — Patient Instructions (Signed)
Your physician has requested that you have a TEE. During a TEE, sound waves are used to create images of your heart. It provides your doctor with information about the size and shape of your heart and how well your heart's chambers and valves are working. In this test, a transducer is attached to the end of a flexible tube that's guided down your throat and into your esophagus (the tube leading from you mouth to your stomach) to get a more detailed image of your heart. You are not awake for the procedure. Please see the instruction sheet given to you today. For further information please visit [email or web address].   [place] AT [place] 12-31-17 BETWEEN 7:30 AM AND 8 AM. YOUR PROCEDURE WILL BE AT 9 AM  NOTHING TO EAST OR DRINK AFTER MIDNIGHT 12/30/17  YOU WILL NEED SOMEONE TO DRIVE YOU HOME AFTER THE PROCEDURE  Your physician recommends that you schedule a follow-up appointment in:  [place]

## 2017-12-18 ENCOUNTER — Other Ambulatory Visit: Payer: Self-pay | Admitting: Physician Assistant

## 2017-12-18 DIAGNOSIS — E2839 Other primary ovarian failure: Secondary | ICD-10-CM

## 2017-12-31 ENCOUNTER — Encounter (HOSPITAL_COMMUNITY): Payer: Self-pay | Admitting: Cardiology

## 2017-12-31 ENCOUNTER — Encounter (HOSPITAL_COMMUNITY)
Admission: RE | Disposition: A | Discharge status: Home / Self Care | Payer: Self-pay | Source: Ambulatory Visit | Attending: Cardiology

## 2017-12-31 ENCOUNTER — Ambulatory Visit (HOSPITAL_COMMUNITY)
Admission: RE | Admit: 2017-12-31 | Discharge: 2017-12-31 | Disposition: A | Discharge status: Home / Self Care | Payer: Medicare HMO | Source: Ambulatory Visit | Attending: Cardiology | Admitting: Cardiology

## 2017-12-31 ENCOUNTER — Other Ambulatory Visit: Payer: Self-pay

## 2017-12-31 ENCOUNTER — Ambulatory Visit (HOSPITAL_BASED_OUTPATIENT_CLINIC_OR_DEPARTMENT_OTHER): Payer: Medicare HMO

## 2017-12-31 DIAGNOSIS — Z9851 Tubal ligation status: Secondary | ICD-10-CM | POA: Insufficient documentation

## 2017-12-31 DIAGNOSIS — Z7901 Long term (current) use of anticoagulants: Secondary | ICD-10-CM | POA: Insufficient documentation

## 2017-12-31 DIAGNOSIS — I050 Rheumatic mitral stenosis: Secondary | ICD-10-CM

## 2017-12-31 DIAGNOSIS — Z951 Presence of aortocoronary bypass graft: Secondary | ICD-10-CM | POA: Insufficient documentation

## 2017-12-31 DIAGNOSIS — I342 Nonrheumatic mitral (valve) stenosis: Secondary | ICD-10-CM

## 2017-12-31 DIAGNOSIS — I081 Rheumatic disorders of both mitral and tricuspid valves: Secondary | ICD-10-CM | POA: Diagnosis present

## 2017-12-31 DIAGNOSIS — Z79899 Other long term (current) drug therapy: Secondary | ICD-10-CM | POA: Diagnosis not present

## 2017-12-31 DIAGNOSIS — Z9889 Other specified postprocedural states: Secondary | ICD-10-CM | POA: Insufficient documentation

## 2017-12-31 DIAGNOSIS — I482 Chronic atrial fibrillation: Secondary | ICD-10-CM | POA: Diagnosis not present

## 2017-12-31 DIAGNOSIS — Z79890 Hormone replacement therapy (postmenopausal): Secondary | ICD-10-CM | POA: Diagnosis not present

## 2017-12-31 DIAGNOSIS — Z8249 Family history of ischemic heart disease and other diseases of the circulatory system: Secondary | ICD-10-CM | POA: Diagnosis not present

## 2017-12-31 HISTORY — PX: TEE WITHOUT CARDIOVERSION: SHX5443

## 2017-12-31 SURGERY — ECHOCARDIOGRAM, TRANSESOPHAGEAL
Anesthesia: Moderate Sedation

## 2017-12-31 MED ORDER — MIDAZOLAM HCL 10 MG/2ML IJ SOLN
INTRAMUSCULAR | Status: DC | PRN
  Administered 2017-12-31: 2 mg via INTRAVENOUS
  Administered 2017-12-31: 1 mg via INTRAVENOUS
  Administered 2017-12-31: 2 mg via INTRAVENOUS

## 2017-12-31 MED ORDER — SODIUM CHLORIDE 0.9 % IV SOLN
INTRAVENOUS | Status: DC
  Administered 2017-12-31: 08:00:00 via INTRAVENOUS

## 2017-12-31 MED ORDER — FENTANYL CITRATE (PF) 100 MCG/2ML IJ SOLN
INTRAMUSCULAR | Status: AC
  Filled 2017-12-31: qty 2

## 2017-12-31 MED ORDER — MIDAZOLAM HCL 5 MG/ML IJ SOLN
INTRAMUSCULAR | Status: AC
  Filled 2017-12-31: qty 2

## 2017-12-31 MED ORDER — BUTAMBEN-TETRACAINE-BENZOCAINE 2-2-14 % EX AERO
INHALATION_SPRAY | CUTANEOUS | Status: DC | PRN
  Administered 2017-12-31: 2 via TOPICAL

## 2017-12-31 MED ORDER — FENTANYL CITRATE (PF) 100 MCG/2ML IJ SOLN
INTRAMUSCULAR | Status: DC | PRN
  Administered 2017-12-31 (×2): 25 ug via INTRAVENOUS

## 2017-12-31 NOTE — CV Procedure (Signed)
    Transesophageal Echocardiogram Note  Lorraine Kemp [id number] 08/25/1950  Procedure: Transesophageal Echocardiogram Indications: Mitral stenosis  Procedure Details Consent: Obtained Time Out: Verified patient identification, verified procedure, site/side was marked, verified correct patient position, special equipment/implants available, Radiology Safety Procedures followed,  medications/allergies/relevent history reviewed, required imaging and test results available.  Performed  Medications:  During this procedure the patient is administered a total of Versed 5 mg and Fentanyl 50 mcg  to achieve and maintain moderate conscious sedation.  The patient's heart rate, blood pressure, and oxygen saturation are monitored continuously during the procedure. The period of conscious sedation is 30 minutes, of which I was present face-to-face 100% of this time.  Normal LV systolic function; rheumatic MV with severe mitral stenosis and mild to moderate mitral regurgitation.   Complications: No apparent complications Patient did tolerate procedure well.  Kirk Ruths, MD

## 2017-12-31 NOTE — Interval H&P Note (Signed)
History and Physical Interval Note:  12/31/2017 7:39 AM  Lorraine Kemp  has presented today for surgery, with the diagnosis of MITRAL  STENOSIS  The various methods of treatment have been discussed with the patient and family. After consideration of risks, benefits and other options for treatment, the patient has consented to  Procedure(s): TRANSESOPHAGEAL ECHOCARDIOGRAM (TEE) (N/A) as a surgical intervention .  The patient's history has been reviewed, patient examined, no change in status, stable for surgery.  I have reviewed the patient's chart and labs.  Questions were answered to the patient's satisfaction.     Kirk Ruths

## 2017-12-31 NOTE — Progress Notes (Signed)
  Echocardiogram Echocardiogram Transesophageal has been performed.  Lorraine Kemp 12/31/2017, 3:08 PM

## 2017-12-31 NOTE — Discharge Instructions (Signed)

## 2017-12-31 NOTE — H&P (Signed)
Office Visit  Go to Cards   12/16/2017  [facility] Wendy Poet, MD     Cardiology         Rheumatic mitral stenosis +1 more     Dx         Follow-up  Mitral stenosis ; Referred by Chesley Noon, MD     Reason for Visit           Additional Documentation     Vitals:       BP 142/74  (BP Location: Right Arm, Patient Position: Sitting, Cuff Size: Normal)        Pulse 66        Ht 5\' 5"  (1.651 m)        Wt 77.6 kg        LMP 05/07/2002 (Approximate)        BMI 28.46 kg/m        BSA 1.89 m               More Vitals      Flowsheets:        Anthropometrics,        MEWS Score       Encounter Info:        Billing Info,        History,        Allergies,        Detailed Report             All Notes         Progress Notes by Lelon Perla, MD at 12/16/2017 3:20 PM     Author: Lelon Perla, MD Author Type: Physician Filed: 12/16/2017  4:22 PM  Note Status: Signed Cosign: Cosign Not Required Encounter Date: 12/16/2017  Editor: Lelon Perla, MD (Physician)               untitled image              HPI: FU permanent atrial fibrillation, MS and rheumatic heart disease. She has had prior balloon valvuloplasty at [facility] in June 2000. We previously referred her to [facility]. She had a stress echo and was felt to have mild to moderate MS and medical therapy was recommended. Repeat valvuloplasty in the future was felt to be problematic as it may increase MR. Carotid Dopplers in November of 2013 were normal. 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 Since I last saw her,    patient continues to have some dyspnea on exertion particularly with using her arms.  This is new over the past 6 months.  No orthopnea, PND, pedal edema, chest pain or syncope.            Current Outpatient Medications    Medication   Sig   Dispense   Refill    .   amoxicillin (AMOXIL) 500 MG capsule   TAKE 4 CAPSULES 1 HOUR BEFORE APPT       2    .   buPROPion (WELLBUTRIN XL) 300 MG 24 hr tablet   TAKE 1 TABLET DAILY   90 tablet   2    .   calcium carbonate (OS-CAL) 600 MG TABS   Take 600  mg by mouth daily.             .   Cholecalciferol (VITAMIN D) 2000 UNITS CAPS   Take 1 capsule by mouth daily.            Marland Kitchen   diltiazem (CARDIZEM CD) 240 MG 24 hr capsule   TAKE 1 CAPSULE (240 MG TOTAL) BY MOUTH DAILY.   90 capsule   3    .   HYDROcodone-acetaminophen (NORCO/VICODIN) 5-325 MG tablet                .   levothyroxine (SYNTHROID, LEVOTHROID) 125 MCG tablet   Take 125 mcg by mouth daily.       6    .   Multiple Vitamins-Minerals (MULTIVITAMIN WITH MINERALS) tablet   Take 1 tablet by mouth daily.             .   Omega-3 Fatty Acids (FISH OIL) 1000 MG CAPS   Take 1 capsule by mouth daily.             Marland Kitchen   warfarin (COUMADIN) 2.5 MG tablet   Take 2.5 mg by mouth daily.                 No current facility-administered medications for this visit.                   Past Medical History:    Diagnosis   Date    .   Atrial fibrillation ([place])   08/26/2008    .   Depression        .   Hematuria            workup by Dr. Diona Fanti    .   MITRAL REGURGITATION   08/26/2008    .   Mitral stenosis   08/26/2008    .   RHEUMATIC HEART DISEASE   08/26/2008    .   Thyroid nodule                    Past Surgical History:    Procedure   Laterality   Date    .   CARDIOVERSION                2000    .   CESAREAN  SECTION            .   COLONOSCOPY            .   PERCUTANEOUS BALLOON VALVULOPLASTY       10/1998        [place]    .   screw in ankles       1989        right    .   THYROIDECTOMY       05/30/2012        Procedure: THYROIDECTOMY;  Surgeon: Earnstine Regal, MD;  Location: [facility];  Service: General;  Laterality: N/A;  total thyroidectomy    .   TONSILLECTOMY       1956    .   TUBAL LIGATION                   Social History             Socioeconomic History    .   Marital status:   Married            Spouse name:  Not on file    .   Number of children:   2    .   Years of education:   Not on file    .   Highest education level:   Not on file    Occupational History    .   Occupation:   Airline pilot            Employer:   [employer]    Social Needs    .   Financial resource strain:   Not on file    .   Food insecurity:            Worry:   Not on file            Inability:   Not on file    .   Transportation needs:            Medical:   Not on file            Non-medical:   Not on file    Tobacco Use    .   Smoking status:   Never Smoker    .   Smokeless tobacco:   Never Used    Substance and Sexual Activity    .   Alcohol use:   Yes            Alcohol/week:   1.0 standard drinks            Types:   1 Glasses of wine per week            Comment: occas glass of wine weekly    .   Drug use:   No    .   Sexual activity:   Yes            Partners:   Male            Birth control/protection:   Surgical, Post-menopausal            Comment: BTL    Lifestyle    .   Physical activity:            Days per week:   Not on file            Minutes per session:   Not on file    .   Stress:    Not on file    Relationships    .   Social connections:            Talks on phone:   Not on file            Gets together:   Not on file            Attends religious service:   Not on file            Active member of club or organization:   Not on file            Attends meetings of clubs or organizations:   Not on file            Relationship status:   Not on file    .   Intimate partner violence:            Fear of current or ex partner:   Not on file            Emotionally abused:   Not on file  Physically abused:   Not on file            Forced sexual activity:   Not on file    Other Topics   Concern    .   Not on file    Social History Narrative    .   Not on file                Family History    Problem   Relation   Age of Onset    .   Lung cancer   Father        .   Cancer   Father                lung    .   Heart disease   Mother                S/P CABG    .   Cancer   Brother                brain    .   Hypertension   Sister        .   Heart disease   Maternal Grandmother              ROS: no fevers or chills, productive cough, hemoptysis, dysphasia, odynophagia, melena, hematochezia, dysuria, hematuria, rash, seizure activity, orthopnea, PND, pedal edema, claudication. Remaining systems are negative.     Physical Exam:  Well-developed well-nourished in no acute distress.   Skin is warm and dry.   HEENT is normal.   Neck is supple.   Chest is clear to auscultation with normal expansion.   Cardiovascular exam is irregular  Abdominal exam nontender or distended. No masses palpated.  Extremities show no edema.  neuro grossly intact     A/P     1 permanent atrial fibrillation-plan to continue Cardizem for rate control.  Continue Coumadin.     2 mitral  stenosis-repeat echocardiogram shows severe mitral stenosis.  She is more symptomatic with increased dyspnea on exertion for the past 6 months.  This has not been present previously.  I will arrange a transesophageal echocardiogram to further assess mitral valve, mitral stenosis, mitral regurgitation and mitral valve anatomy.  Note she was seen at [facility] previously and balloon valvuloplasty was felt likely not possible as it would exacerbate mitral regurgitation.  If transesophageal echocardiogram suggests severe mitral stenosis we will also likely arrange cardiac catheterization to rule out coronary disease and assess pulmonary pressure.  Would then need to refer for possible mitral valve replacement.     3 history of palpitations-no recent symptoms.     Kirk Ruths, MD    For TEE, no changes Kirk Ruths

## 2018-01-28 ENCOUNTER — Ambulatory Visit
Admission: RE | Admit: 2018-01-28 | Discharge: 2018-01-28 | Disposition: A | Discharge status: Home / Self Care | Payer: Medicare HMO | Source: Ambulatory Visit | Attending: Physician Assistant | Admitting: Physician Assistant

## 2018-01-28 DIAGNOSIS — Z780 Asymptomatic menopausal state: Secondary | ICD-10-CM | POA: Insufficient documentation

## 2018-01-28 DIAGNOSIS — M8580 Other specified disorders of bone density and structure, unspecified site: Secondary | ICD-10-CM | POA: Insufficient documentation

## 2018-01-28 DIAGNOSIS — E2839 Other primary ovarian failure: Secondary | ICD-10-CM

## 2018-02-10 NOTE — H&P (View-Only) (Signed)
HPI: FU permanent atrial fibrillation, MS and rheumatic heart disease. She has had prior balloon valvuloplasty at [facility] in June 2000. 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 TEE August 2019 showed normal LV function, severe mitral stenosis (MVA 0.96 cm), mild to moderate mitral regurgitation, biatrial enlargement, mild right ventricular enlargement, mild to moderate tricuspid regurgitation.  Seen by Dr. Mina Marble at [facility] and balloon valvuloplasty versus mitral valve replacement felt to be the options following right and left cardiac catheterization (if cardiac catheterization revealed coronary disease then coronary artery bypass graft/mitral valve replacement would be best option).  Since I last saw her,she continues to have dyspnea on exertion.  No orthopnea, PND, pedal edema, chest pain or syncope.  Occasional palpitations.  Current Outpatient Medications  Medication Sig Dispense Refill  . amoxicillin (AMOXIL) 500 MG capsule Take 2,000 mg by mouth See admin instructions. Take 2000 mg by mouth 1 hour prior to dental appointment  2  . buPROPion (WELLBUTRIN XL) 300 MG 24 hr tablet TAKE 1 TABLET DAILY (Patient taking differently: Take 300 mg by mouth daily. ) 90 tablet 2  . diltiazem (CARDIZEM CD) 240 MG 24 hr capsule TAKE 1 CAPSULE (240 MG TOTAL) BY MOUTH DAILY. 90 capsule 3  . levothyroxine (SYNTHROID, LEVOTHROID) 125 MCG tablet Take 125 mcg by mouth daily before breakfast.   6  . Multiple Minerals-Vitamins (CALCIUM-MAGNESIUM-ZINC-D3 PO) Take 1 tablet by mouth daily.    . Multiple Vitamins-Minerals (MULTIVITAMIN WITH MINERALS) tablet Take 1 tablet by mouth daily.     . Omega-3 Fatty Acids (FISH  OIL) 1000 MG CAPS Take 1,000 mg by mouth daily.     Marland Kitchen warfarin (COUMADIN) 2.5 MG tablet Take 2.5-5 mg by mouth See admin instructions. Take 5 mg by mouth daily on Monday, Wednesday and Friday. Take 2.5 mg by mouth daily on all other days.     No current facility-administered medications for this visit.      Past Medical History:  Diagnosis Date  . Atrial fibrillation ([place]) 08/26/2008  . Depression   . Hematuria    workup by Dr. Diona Fanti  . MITRAL REGURGITATION 08/26/2008  . Mitral stenosis 08/26/2008  . RHEUMATIC HEART DISEASE 08/26/2008  . Thyroid nodule     Past Surgical History:  Procedure Laterality Date  . CARDIOVERSION     2000  . CESAREAN SECTION    . COLONOSCOPY    . PERCUTANEOUS BALLOON VALVULOPLASTY  10/1998   [place]  . screw in ankles  1989   right  . TEE WITHOUT CARDIOVERSION N/A 12/31/2017   Procedure: TRANSESOPHAGEAL ECHOCARDIOGRAM (TEE);  Surgeon: Lelon Perla, MD;  Location: [facility] ENDOSCOPY;  Service: Cardiovascular;  Laterality: N/A;  . THYROIDECTOMY  05/30/2012   Procedure: THYROIDECTOMY;  Surgeon: Earnstine Regal, MD;  Location: [facility];  Service: General;  Laterality: N/A;  total thyroidectomy  . TONSILLECTOMY  1956  . TUBAL LIGATION      Social History   Socioeconomic History  . Marital status: Married    Spouse name: Not on file  . Number of children: 2  . Years of education: Not on file  . Highest education level: Not on file  Occupational History  . Occupation: Passenger transport manager: [employer]  Social Needs  . Financial resource strain: Not on file  . Food insecurity:    Worry: Not on file    Inability: Not on file  . Transportation needs:    Medical: Not on file    Non-medical: Not on file  Tobacco Use  . Smoking status: Never Smoker  . Smokeless tobacco: Never Used  Substance and Sexual Activity  . Alcohol use: Yes    Alcohol/week: 1.0 standard drinks    Types: 1 Glasses of wine per week    Comment: occas  glass of wine weekly  . Drug use: No  . Sexual activity: Yes    Partners: Male    Birth control/protection: Surgical, Post-menopausal    Comment: BTL  Lifestyle  . Physical activity:    Days per week: Not on file    Minutes per session: Not on file  . Stress: Not on file  Relationships  . Social connections:    Talks on phone: Not on file    Gets together: Not on file    Attends religious service: Not on file    Active member of club or organization: Not on file    Attends meetings of clubs or organizations: Not on file    Relationship status: Not on file  . Intimate partner violence:    Fear of current or ex partner: Not on file    Emotionally abused: Not on file    Physically abused: Not on file    Forced sexual activity: Not on file  Other Topics Concern  . Not on file  Social History Narrative  . Not on file    Family History  Problem Relation Age of Onset  . Lung cancer Father   . Cancer Father        lung  . Heart disease Mother        S/P CABG  . Cancer Brother        brain  . Hypertension Sister   . Heart disease Maternal Grandmother     ROS: no fevers or chills, productive cough, hemoptysis, dysphasia, odynophagia, melena, hematochezia, dysuria, hematuria, rash, seizure activity, orthopnea, PND, pedal edema, claudication. Remaining systems are negative.  Physical Exam: Well-developed well-nourished in no acute distress.  Skin is warm and dry.  HEENT is normal.  Neck is supple.  Chest is clear to auscultation with normal expansion.  Cardiovascular exam is irregular Abdominal exam nontender or distended. No masses palpated. Extremities show no edema. neuro grossly intact   A/P  1 mitral stenosis-transesophageal echocardiogram consistent with severe mitral stenosis.  We discussed options of mitral valve replacement versus repeat attempt at mitral balloon valvuloplasty.  She is hesitant to consider mitral balloon valvuloplasty because of potential need  for surgery if procedure unsuccessful or if mitral regurgitation worsens.  She would like to proceed with mitral valve replacement.  I will arrange left and right cardiac catheterization.  The risks and benefits including myocardial infarction, CVA and death discussed and she agrees to proceed.  Hold Coumadin prior to procedure.  Given mitral stenosis and higher risk of embolic event I will arrange Lovenox bridge.  I will arrange evaluation with Dr. Roxy Manns for consideration of mitral valve replacement.  2 permanent atrial fibrillation-we will continue with Cardizem for rate control.  Continue Coumadin.  3 history of palpitations-no recent symptoms.  Kirk Ruths, MD

## 2018-02-10 NOTE — Progress Notes (Signed)
HPI: FU permanent atrial fibrillation, MS and rheumatic heart disease. She has had prior balloon valvuloplasty at [facility] in June 2000. 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 TEE August 2019 showed normal LV function, severe mitral stenosis (MVA 0.96 cm), mild to moderate mitral regurgitation, biatrial enlargement, mild right ventricular enlargement, mild to moderate tricuspid regurgitation.  Seen by Dr. Mina Marble at [facility] and balloon valvuloplasty versus mitral valve replacement felt to be the options following right and left cardiac catheterization (if cardiac catheterization revealed coronary disease then coronary artery bypass graft/mitral valve replacement would be best option).  Since I last saw her,she continues to have dyspnea on exertion.  No orthopnea, PND, pedal edema, chest pain or syncope.  Occasional palpitations.  Current Outpatient Medications  Medication Sig Dispense Refill  . amoxicillin (AMOXIL) 500 MG capsule Take 2,000 mg by mouth See admin instructions. Take 2000 mg by mouth 1 hour prior to dental appointment  2  . buPROPion (WELLBUTRIN XL) 300 MG 24 hr tablet TAKE 1 TABLET DAILY (Patient taking differently: Take 300 mg by mouth daily. ) 90 tablet 2  . diltiazem (CARDIZEM CD) 240 MG 24 hr capsule TAKE 1 CAPSULE (240 MG TOTAL) BY MOUTH DAILY. 90 capsule 3  . levothyroxine (SYNTHROID, LEVOTHROID) 125 MCG tablet Take 125 mcg by mouth daily before breakfast.   6  . Multiple Minerals-Vitamins (CALCIUM-MAGNESIUM-ZINC-D3 PO) Take 1 tablet by mouth daily.    . Multiple Vitamins-Minerals (MULTIVITAMIN WITH MINERALS) tablet Take 1 tablet by mouth daily.     . Omega-3 Fatty Acids (FISH  OIL) 1000 MG CAPS Take 1,000 mg by mouth daily.     Marland Kitchen warfarin (COUMADIN) 2.5 MG tablet Take 2.5-5 mg by mouth See admin instructions. Take 5 mg by mouth daily on Monday, Wednesday and Friday. Take 2.5 mg by mouth daily on all other days.     No current facility-administered medications for this visit.      Past Medical History:  Diagnosis Date  . Atrial fibrillation ([place]) 08/26/2008  . Depression   . Hematuria    workup by Dr. Diona Fanti  . MITRAL REGURGITATION 08/26/2008  . Mitral stenosis 08/26/2008  . RHEUMATIC HEART DISEASE 08/26/2008  . Thyroid nodule     Past Surgical History:  Procedure Laterality Date  . CARDIOVERSION     2000  . CESAREAN SECTION    . COLONOSCOPY    . PERCUTANEOUS BALLOON VALVULOPLASTY  10/1998   [place]  . screw in ankles  1989   right  . TEE WITHOUT CARDIOVERSION N/A 12/31/2017   Procedure: TRANSESOPHAGEAL ECHOCARDIOGRAM (TEE);  Surgeon: Lelon Perla, MD;  Location: [facility] ENDOSCOPY;  Service: Cardiovascular;  Laterality: N/A;  . THYROIDECTOMY  05/30/2012   Procedure: THYROIDECTOMY;  Surgeon: Earnstine Regal, MD;  Location: [facility] ORS;  Service: General;  Laterality: N/A;  total thyroidectomy  . TONSILLECTOMY  1956  . TUBAL LIGATION      Social History   Socioeconomic History  . Marital status: Married    Spouse name: Not on file  . Number of children: 2  . Years of education: Not on file  . Highest education level: Not on file  Occupational History  . Occupation: Passenger transport manager: [employer]  Social Needs  . Financial resource strain: Not on file  . Food insecurity:    Worry: Not on file    Inability: Not on file  . Transportation needs:    Medical: Not on file    Non-medical: Not on file  Tobacco Use  . Smoking status: Never Smoker  . Smokeless tobacco: Never Used  Substance and Sexual Activity  . Alcohol use: Yes    Alcohol/week: 1.0 standard drinks    Types: 1 Glasses of wine per week    Comment: occas  glass of wine weekly  . Drug use: No  . Sexual activity: Yes    Partners: Male    Birth control/protection: Surgical, Post-menopausal    Comment: BTL  Lifestyle  . Physical activity:    Days per week: Not on file    Minutes per session: Not on file  . Stress: Not on file  Relationships  . Social connections:    Talks on phone: Not on file    Gets together: Not on file    Attends religious service: Not on file    Active member of club or organization: Not on file    Attends meetings of clubs or organizations: Not on file    Relationship status: Not on file  . Intimate partner violence:    Fear of current or ex partner: Not on file    Emotionally abused: Not on file    Physically abused: Not on file    Forced sexual activity: Not on file  Other Topics Concern  . Not on file  Social History Narrative  . Not on file    Family History  Problem Relation Age of Onset  . Lung cancer Father   . Cancer Father        lung  . Heart disease Mother        S/P CABG  . Cancer Brother        brain  . Hypertension Sister   . Heart disease Maternal Grandmother     ROS: no fevers or chills, productive cough, hemoptysis, dysphasia, odynophagia, melena, hematochezia, dysuria, hematuria, rash, seizure activity, orthopnea, PND, pedal edema, claudication. Remaining systems are negative.  Physical Exam: Well-developed well-nourished in no acute distress.  Skin is warm and dry.  HEENT is normal.  Neck is supple.  Chest is clear to auscultation with normal expansion.  Cardiovascular exam is irregular Abdominal exam nontender or distended. No masses palpated. Extremities show no edema. neuro grossly intact   A/P  1 mitral stenosis-transesophageal echocardiogram consistent with severe mitral stenosis.  We discussed options of mitral valve replacement versus repeat attempt at mitral balloon valvuloplasty.  She is hesitant to consider mitral balloon valvuloplasty because of potential need  for surgery if procedure unsuccessful or if mitral regurgitation worsens.  She would like to proceed with mitral valve replacement.  I will arrange left and right cardiac catheterization.  The risks and benefits including myocardial infarction, CVA and death discussed and she agrees to proceed.  Hold Coumadin prior to procedure.  Given mitral stenosis and higher risk of embolic event I will arrange Lovenox bridge.  I will arrange evaluation with Dr. Roxy Manns for consideration of mitral valve replacement.  2 permanent atrial fibrillation-we will continue with Cardizem for rate control.  Continue Coumadin.  3 history of palpitations-no recent symptoms.  Kirk Ruths, MD

## 2018-02-13 ENCOUNTER — Ambulatory Visit: Payer: Medicare HMO | Admitting: Cardiology

## 2018-02-24 ENCOUNTER — Ambulatory Visit: Payer: Medicare HMO | Admitting: Cardiology

## 2018-02-24 ENCOUNTER — Encounter: Payer: Self-pay | Admitting: Cardiology

## 2018-02-24 ENCOUNTER — Ambulatory Visit (INDEPENDENT_AMBULATORY_CARE_PROVIDER_SITE_OTHER): Payer: Medicare HMO | Admitting: Pharmacist Clinician (PhC)/ Clinical Pharmacy Specialist

## 2018-02-24 VITALS — BP 114/70 | HR 79 | Ht 65.0 in | Wt 169.0 lb

## 2018-02-24 DIAGNOSIS — R002 Palpitations: Secondary | ICD-10-CM | POA: Diagnosis not present

## 2018-02-24 DIAGNOSIS — Z01818 Encounter for other preprocedural examination: Secondary | ICD-10-CM | POA: Diagnosis not present

## 2018-02-24 DIAGNOSIS — I4821 Permanent atrial fibrillation: Secondary | ICD-10-CM

## 2018-02-24 DIAGNOSIS — I050 Rheumatic mitral stenosis: Secondary | ICD-10-CM | POA: Diagnosis not present

## 2018-02-24 LAB — POCT INR: INR: 3.3 — AB (ref 2.0–3.0)

## 2018-02-24 NOTE — Patient Instructions (Addendum)
    Lorraine Kemp [place] [address] Dept: [phone] Loc: [phone]  Lorraine Kemp  02/24/2018  You are scheduled for a Cardiac Catheterization on Tuesday, October 29 with Dr. Larae Grooms.  1. Please arrive at the [facility] (Main Entrance A) at [facility]: [address] [place], [place] [address] at 5:30 AM (This time is two hours before your procedure to ensure your preparation). Free valet parking service is available.   Special note: Every effort is made to have your procedure done on time. Please understand that emergencies sometimes delay scheduled procedures.  2. Diet: Do not eat solid foods after midnight.  The patient may have clear liquids until 5am upon the day of the procedure.  3. Labs: You will need to have blood drawn TODAY  4. Medication instructions in preparation for your procedure:  TAKE 2.5 MG OF WARFARIN TODAY  STOP WARFARIN Thursday THIS WEEK 03-06-18  CONTACT MEDICAL DOCTOR ABOUT LOVENOX BRIDGING  On the morning of your procedure, take your any morning medicines NOT listed above.  You may use sips of water.  5. Plan for one night stay--bring personal belongings. 6. Bring a current list of your medications and current insurance cards. 7. You MUST have a responsible person to drive you home. 8. Someone MUST be with you the first 24 hours after you arrive home or your discharge will be delayed. 9. Please wear clothes that are easy to get on and off and wear slip-on shoes.  Thank you for allowing [place] to care for you!   -- [facility]   REFERRAL TO DR Roxy Manns AT [place]

## 2018-02-25 LAB — CBC
HEMATOCRIT: 42.1 % (ref 34.0–46.6)
Hemoglobin: 13.3 g/dL (ref 11.1–15.9)
MCH: 28.5 pg (ref 26.6–33.0)
MCHC: 31.6 g/dL (ref 31.5–35.7)
MCV: 90 fL (ref 79–97)
PLATELETS: 225 10*3/uL (ref 150–450)
RBC: 4.66 x10E6/uL (ref 3.77–5.28)
RDW: 13.6 % (ref 12.3–15.4)
WBC: 5.2 10*3/uL (ref 3.4–10.8)

## 2018-02-25 LAB — BASIC METABOLIC PANEL
BUN / CREAT RATIO: 13 (ref 12–28)
BUN: 14 mg/dL (ref 8–27)
CHLORIDE: 102 mmol/L (ref 96–106)
CO2: 23 mmol/L (ref 20–29)
Calcium: 10 mg/dL (ref 8.7–10.3)
Creatinine, Ser: 1.08 mg/dL — ABNORMAL HIGH (ref 0.57–1.00)
GFR calc non Af Amer: 53 mL/min/{1.73_m2} — ABNORMAL LOW (ref >59)
GFR, EST AFRICAN AMERICAN: 61 mL/min/{1.73_m2} (ref >59)
GLUCOSE: 89 mg/dL (ref 65–99)
Potassium: 4.9 mmol/L (ref 3.5–5.2)
SODIUM: 140 mmol/L (ref 134–144)

## 2018-02-27 ENCOUNTER — Telehealth: Payer: Self-pay | Admitting: *Deleted

## 2018-02-27 NOTE — Telephone Encounter (Signed)
Pt contacted pre-catheterization scheduled at [facility] for: Tuesday March 04, 2018 7:30 AM Verified arrival time and place: [place] Entrance A at: 5:30 AM  No solid food after midnight prior to cath, clear liquids until 5 AM day of procedure. Contrast allergy: no  Hold: Coumadin-last dose 02/24/18-pt has appt for PT/INR tomorrow-pt states she will get instructions for Lovenox bridging at that appointment.  Except hold medications AM meds can be  taken pre-cath with sip of water including: ASA 81 mg  Confirmed patient has responsible person to drive home post procedure and for 24 hours after you arrive home: yes

## 2018-03-04 ENCOUNTER — Encounter (HOSPITAL_COMMUNITY)
Admission: RE | Disposition: A | Discharge status: Home / Self Care | Payer: Self-pay | Source: Ambulatory Visit | Attending: Interventional Cardiology

## 2018-03-04 ENCOUNTER — Encounter (HOSPITAL_COMMUNITY): Payer: Self-pay | Admitting: Interventional Cardiology

## 2018-03-04 ENCOUNTER — Ambulatory Visit (HOSPITAL_COMMUNITY)
Admission: RE | Admit: 2018-03-04 | Discharge: 2018-03-04 | Disposition: A | Discharge status: Home / Self Care | Payer: Medicare HMO | Source: Ambulatory Visit | Attending: Interventional Cardiology | Admitting: Interventional Cardiology

## 2018-03-04 DIAGNOSIS — Z8249 Family history of ischemic heart disease and other diseases of the circulatory system: Secondary | ICD-10-CM | POA: Insufficient documentation

## 2018-03-04 DIAGNOSIS — E890 Postprocedural hypothyroidism: Secondary | ICD-10-CM | POA: Insufficient documentation

## 2018-03-04 DIAGNOSIS — I4821 Permanent atrial fibrillation: Secondary | ICD-10-CM | POA: Diagnosis not present

## 2018-03-04 DIAGNOSIS — I052 Rheumatic mitral stenosis with insufficiency: Secondary | ICD-10-CM | POA: Insufficient documentation

## 2018-03-04 DIAGNOSIS — Z9889 Other specified postprocedural states: Secondary | ICD-10-CM | POA: Diagnosis not present

## 2018-03-04 DIAGNOSIS — Z79890 Hormone replacement therapy (postmenopausal): Secondary | ICD-10-CM | POA: Insufficient documentation

## 2018-03-04 DIAGNOSIS — Z79899 Other long term (current) drug therapy: Secondary | ICD-10-CM | POA: Diagnosis not present

## 2018-03-04 DIAGNOSIS — Z9851 Tubal ligation status: Secondary | ICD-10-CM | POA: Diagnosis not present

## 2018-03-04 DIAGNOSIS — F329 Major depressive disorder, single episode, unspecified: Secondary | ICD-10-CM | POA: Insufficient documentation

## 2018-03-04 DIAGNOSIS — Z01818 Encounter for other preprocedural examination: Secondary | ICD-10-CM

## 2018-03-04 DIAGNOSIS — G35 Multiple sclerosis: Secondary | ICD-10-CM | POA: Insufficient documentation

## 2018-03-04 DIAGNOSIS — I2510 Atherosclerotic heart disease of native coronary artery without angina pectoris: Secondary | ICD-10-CM | POA: Insufficient documentation

## 2018-03-04 DIAGNOSIS — Z7901 Long term (current) use of anticoagulants: Secondary | ICD-10-CM | POA: Insufficient documentation

## 2018-03-04 DIAGNOSIS — I050 Rheumatic mitral stenosis: Secondary | ICD-10-CM

## 2018-03-04 HISTORY — PX: RIGHT/LEFT HEART CATH AND CORONARY ANGIOGRAPHY: CATH118266

## 2018-03-04 HISTORY — PX: ABDOMINAL AORTOGRAM: CATH118222

## 2018-03-04 LAB — POCT I-STAT 3, VENOUS BLOOD GAS (G3P V)
Acid-base deficit: 3 mmol/L — ABNORMAL HIGH (ref 0.0–2.0)
BICARBONATE: 23.7 mmol/L (ref 20.0–28.0)
O2 SAT: 73 %
PCO2 VEN: 46.1 mmHg (ref 44.0–60.0)
TCO2: 25 mmol/L (ref 22–32)
pH, Ven: 7.319 (ref 7.250–7.430)
pO2, Ven: 42 mmHg (ref 32.0–45.0)

## 2018-03-04 LAB — POCT I-STAT 3, ART BLOOD GAS (G3+)
ACID-BASE DEFICIT: 3 mmol/L — AB (ref 0.0–2.0)
BICARBONATE: 22.1 mmol/L (ref 20.0–28.0)
O2 SAT: 98 %
PCO2 ART: 38.2 mmHg (ref 32.0–48.0)
PO2 ART: 107 mmHg (ref 83.0–108.0)
TCO2: 23 mmol/L (ref 22–32)
pH, Arterial: 7.37 (ref 7.350–7.450)

## 2018-03-04 LAB — PROTIME-INR
INR: 0.98
Prothrombin Time: 12.9 seconds (ref 11.4–15.2)

## 2018-03-04 SURGERY — RIGHT/LEFT HEART CATH AND CORONARY ANGIOGRAPHY
Anesthesia: LOCAL

## 2018-03-04 MED ORDER — ASPIRIN 81 MG PO CHEW: 81.0000 mg | CHEWABLE_TABLET | ORAL | Status: DC

## 2018-03-04 MED ORDER — VERAPAMIL HCL 2.5 MG/ML IV SOLN
INTRAVENOUS | Status: AC
  Filled 2018-03-04: qty 2

## 2018-03-04 MED ORDER — SODIUM CHLORIDE 0.9 % IV SOLN: 250.0000 mL | INTRAVENOUS | Status: DC | PRN

## 2018-03-04 MED ORDER — LIDOCAINE HCL (PF) 1 % IJ SOLN
INTRAMUSCULAR | Status: AC
  Filled 2018-03-04: qty 30

## 2018-03-04 MED ORDER — VERAPAMIL HCL 2.5 MG/ML IV SOLN
INTRAVENOUS | Status: DC | PRN
  Administered 2018-03-04: 08:00:00 via INTRA_ARTERIAL

## 2018-03-04 MED ORDER — FENTANYL CITRATE (PF) 100 MCG/2ML IJ SOLN
INTRAMUSCULAR | Status: AC
  Filled 2018-03-04: qty 2

## 2018-03-04 MED ORDER — LIDOCAINE HCL (PF) 1 % IJ SOLN
INTRAMUSCULAR | Status: DC | PRN
  Administered 2018-03-04 (×2): 2 mL

## 2018-03-04 MED ORDER — MIDAZOLAM HCL 2 MG/2ML IJ SOLN
INTRAMUSCULAR | Status: DC | PRN
  Administered 2018-03-04: 2 mg via INTRAVENOUS

## 2018-03-04 MED ORDER — IOHEXOL 350 MG/ML SOLN
INTRAVENOUS | Status: DC | PRN
  Administered 2018-03-04: 90 mL via INTRA_ARTERIAL

## 2018-03-04 MED ORDER — SODIUM CHLORIDE 0.9 % WEIGHT BASED INFUSION: 1.0000 mL/kg/h | INTRAVENOUS | Status: DC

## 2018-03-04 MED ORDER — HEPARIN (PORCINE) IN NACL 1000-0.9 UT/500ML-% IV SOLN
INTRAVENOUS | Status: AC
  Filled 2018-03-04: qty 500

## 2018-03-04 MED ORDER — HEPARIN (PORCINE) IN NACL 1000-0.9 UT/500ML-% IV SOLN
INTRAVENOUS | Status: DC | PRN
  Administered 2018-03-04: 500 mL

## 2018-03-04 MED ORDER — SODIUM CHLORIDE 0.9% FLUSH: 3.0000 mL | Freq: Two times a day (BID) | INTRAVENOUS | Status: DC

## 2018-03-04 MED ORDER — SODIUM CHLORIDE 0.9% FLUSH: 3.0000 mL | INTRAVENOUS | Status: DC | PRN

## 2018-03-04 MED ORDER — SODIUM CHLORIDE 0.9 % WEIGHT BASED INFUSION
3.0000 mL/kg/h | INTRAVENOUS | Status: AC
  Administered 2018-03-04: 3 mL/kg/h via INTRAVENOUS

## 2018-03-04 MED ORDER — HEPARIN SODIUM (PORCINE) 1000 UNIT/ML IJ SOLN
INTRAMUSCULAR | Status: DC | PRN
  Administered 2018-03-04: 3500 [IU] via INTRAVENOUS

## 2018-03-04 MED ORDER — ACETAMINOPHEN 325 MG PO TABS: 650.0000 mg | ORAL_TABLET | ORAL | Status: DC | PRN

## 2018-03-04 MED ORDER — ONDANSETRON HCL 4 MG/2ML IJ SOLN: 4.0000 mg | Freq: Four times a day (QID) | INTRAMUSCULAR | Status: DC | PRN

## 2018-03-04 MED ORDER — FENTANYL CITRATE (PF) 100 MCG/2ML IJ SOLN
INTRAMUSCULAR | Status: DC | PRN
  Administered 2018-03-04: 25 ug via INTRAVENOUS

## 2018-03-04 MED ORDER — SODIUM CHLORIDE 0.9 % IV SOLN: INTRAVENOUS | Status: AC

## 2018-03-04 MED ORDER — MIDAZOLAM HCL 2 MG/2ML IJ SOLN
INTRAMUSCULAR | Status: AC
  Filled 2018-03-04: qty 2

## 2018-03-04 SURGICAL SUPPLY — 15 items
CATH 5FR JL3.5 JR4 ANG PIG MP (CATHETERS) ×2 IMPLANT
CATH BALLN WEDGE 5F 110CM (CATHETERS) ×2 IMPLANT
DEVICE RAD COMP TR BAND LRG (VASCULAR PRODUCTS) ×2 IMPLANT
GLIDESHEATH SLEND SS 6F .021 (SHEATH) ×2 IMPLANT
GUIDEWIRE INQWIRE 1.5J.035X260 (WIRE) ×1 IMPLANT
INQWIRE 1.5J .035X260CM (WIRE) ×2
KIT HEART LEFT (KITS) ×2 IMPLANT
PACK CARDIAC CATHETERIZATION (CUSTOM PROCEDURE TRAY) ×2 IMPLANT
SHEATH GLIDE SLENDER 4/5FR (SHEATH) ×2 IMPLANT
SHEATH PROBE COVER 6X72 (BAG) ×2 IMPLANT
SYR MEDRAD MARK V 150ML (SYRINGE) ×2 IMPLANT
TRANSDUCER W/STOPCOCK (MISCELLANEOUS) ×4 IMPLANT
TUBING CIL FLEX 10 FLL-RA (TUBING) ×2 IMPLANT
WIRE EMERALD 3MM-J .025X260CM (WIRE) ×2 IMPLANT
WIRE HI TORQ VERSACORE-J 145CM (WIRE) ×2 IMPLANT

## 2018-03-04 NOTE — Interval H&P Note (Signed)
Cath Lab Visit (complete for each Cath Lab visit)  Clinical Evaluation Leading to the Procedure:   ACS: No.  Non-ACS:    Anginal Classification: CCS III  Anti-ischemic medical therapy: Minimal Therapy (1 class of medications)  Non-Invasive Test Results: Intermediate-risk stress test findings: cardiac mortality 1-3%/year  Prior CABG: No previous CABG   Mitral stenosis   History and Physical Interval Note:  03/04/2018 7:36 AM  Lorraine Kemp  has presented today for surgery, with the diagnosis of stenosis  The various methods of treatment have been discussed with the patient and family. After consideration of risks, benefits and other options for treatment, the patient has consented to  Procedure(s): RIGHT/LEFT HEART CATH AND CORONARY ANGIOGRAPHY (N/A) as a surgical intervention .  The patient's history has been reviewed, patient examined, no change in status, stable for surgery.  I have reviewed the patient's chart and labs.  Questions were answered to the patient's satisfaction.     Larae Grooms

## 2018-03-04 NOTE — Discharge Instructions (Signed)
Radial Site Care °Refer to this sheet in the next few weeks. These instructions provide you with information about caring for yourself after your procedure. Your health care provider may also give you more specific instructions. Your treatment has been planned according to current medical practices, but problems sometimes occur. Call your health care provider if you have any problems or questions after your procedure. °What can I expect after the procedure? °After your procedure, it is typical to have the following: °· Bruising at the radial site that usually fades within 1-2 weeks. °· Blood collecting in the tissue (hematoma) that may be painful to the touch. It should usually decrease in size and tenderness within 1-2 weeks. ° °Follow these instructions at home: °· Take medicines only as directed by your health care provider. °· You may shower 24-48 hours after the procedure or as directed by your health care provider. Remove the bandage (dressing) and gently wash the site with plain soap and water. Pat the area dry with a clean towel. Do not rub the site, because this may cause bleeding. °· Do not take baths, swim, or use a hot tub until your health care provider approves. °· Check your insertion site every day for redness, swelling, or drainage. °· Do not apply powder or lotion to the site. °· Do not flex or bend the affected arm for 24 hours or as directed by your health care provider. °· Do not push or pull heavy objects with the affected arm for 24 hours or as directed by your health care provider. °· Do not lift over 10 lb (4.5 kg) for 5 days after your procedure or as directed by your health care provider. °· Ask your health care provider when it is okay to: °? Return to work or school. °? Resume usual physical activities or sports. °? Resume sexual activity. °· Do not drive home if you are discharged the same day as the procedure. Have someone else drive you. °· You may drive 24 hours after the procedure  unless otherwise instructed by your health care provider. °· Do not operate machinery or power tools for 24 hours after the procedure. °· If your procedure was done as an outpatient procedure, which means that you went home the same day as your procedure, a responsible adult should be with you for the first 24 hours after you arrive home. °· Keep all follow-up visits as directed by your health care provider. This is important. °Contact a health care provider if: °· You have a fever. °· You have chills. °· You have increased bleeding from the radial site. Hold pressure on the site. °Get help right away if: °· You have unusual pain at the radial site. °· You have redness, warmth, or swelling at the radial site. °· You have drainage (other than a small amount of blood on the dressing) from the radial site. °· The radial site is bleeding, and the bleeding does not stop after 30 minutes of holding steady pressure on the site. °· Your arm or hand becomes pale, cool, tingly, or numb. °This information is not intended to replace advice given to you by your health care provider. Make sure you discuss any questions you have with your health care provider. °Document Released: 05/26/2010 Document Revised: 09/29/2015 Document Reviewed: 11/09/2013 °Elsevier Interactive Patient Education © 2018 [employer] ° ° ° °Moderate Conscious Sedation, Adult, Care After °These instructions provide you with information about caring for yourself after your procedure. Your health care provider   may also give you more specific instructions. Your treatment has been planned according to current medical practices, but problems sometimes occur. Call your health care provider if you have any problems or questions after your procedure. °What can I expect after the procedure? °After your procedure, it is common: °· To feel sleepy for several hours. °· To feel clumsy and have poor balance for several hours. °· To have poor judgment for several  hours. °· To vomit if you eat too soon. ° °Follow these instructions at home: °For at least 24 hours after the procedure: ° °· Do not: °? Participate in activities where you could fall or become injured. °? Drive. °? Use heavy machinery. °? Drink alcohol. °? Take sleeping pills or medicines that cause drowsiness. °? Make important decisions or sign legal documents. °? Take care of children on your own. °· Rest. °Eating and drinking °· Follow the diet recommended by your health care provider. °· If you vomit: °? Drink water, juice, or soup when you can drink without vomiting. °? Make sure you have little or no nausea before eating solid foods. °General instructions °· Have a responsible adult stay with you until you are awake and alert. °· Take over-the-counter and prescription medicines only as told by your health care provider. °· If you smoke, do not smoke without supervision. °· Keep all follow-up visits as told by your health care provider. This is important. °Contact a health care provider if: °· You keep feeling nauseous or you keep vomiting. °· You feel light-headed. °· You develop a rash. °· You have a fever. °Get help right away if: °· You have trouble breathing. °This information is not intended to replace advice given to you by your health care provider. Make sure you discuss any questions you have with your health care provider. °Document Released: 02/11/2013 Document Revised: 09/26/2015 Document Reviewed: 08/13/2015 °Elsevier Interactive Patient Education © 2018 [employer] ° °

## 2018-03-07 ENCOUNTER — Encounter: Payer: Self-pay | Admitting: Thoracic Surgery (Cardiothoracic Vascular Surgery)

## 2018-03-07 ENCOUNTER — Other Ambulatory Visit: Payer: Self-pay | Admitting: *Deleted

## 2018-03-07 ENCOUNTER — Other Ambulatory Visit: Payer: Self-pay

## 2018-03-07 ENCOUNTER — Institutional Professional Consult (permissible substitution): Payer: Medicare HMO | Admitting: Thoracic Surgery (Cardiothoracic Vascular Surgery)

## 2018-03-07 VITALS — BP 130/80 | HR 96 | Resp 18 | Ht 65.0 in | Wt 168.0 lb

## 2018-03-07 DIAGNOSIS — I4821 Permanent atrial fibrillation: Secondary | ICD-10-CM

## 2018-03-07 DIAGNOSIS — I080 Rheumatic disorders of both mitral and aortic valves: Secondary | ICD-10-CM

## 2018-03-07 DIAGNOSIS — I050 Rheumatic mitral stenosis: Secondary | ICD-10-CM

## 2018-03-07 DIAGNOSIS — I7409 Other arterial embolism and thrombosis of abdominal aorta: Secondary | ICD-10-CM

## 2018-03-07 DIAGNOSIS — I4891 Unspecified atrial fibrillation: Secondary | ICD-10-CM

## 2018-03-07 DIAGNOSIS — Z01818 Encounter for other preprocedural examination: Secondary | ICD-10-CM

## 2018-03-07 NOTE — Patient Instructions (Signed)
Stop taking Coumadin after you take your dose on 11/15   Begin lovenox injections the following day  Continue taking all other medications without change through the day before surgery.  Have nothing to eat or drink after midnight the night before surgery.  On the morning of surgery take only Synthroid with a sip of water.

## 2018-03-07 NOTE — Progress Notes (Signed)
[facility]  MULTIDISCIPLINARY HEART VALVE CLINIC  CARDIOTHORACIC SURGERY CONSULTATION REPORT  Referring Provider is Crenshaw, Denice Bors, MD PCP is Chesley Noon, MD  Chief Complaint  Patient presents with  . Mitral Stenosis    new patient consultation, CATH 03/04/18, ECHO 12/12/2017    HPI:  [name] with history of rheumatic heart disease who has been referred for surgical consultation to discuss treatment options for management of severe symptomatic mitral stenosis.  Patient's cardiac history dates back more than 20 years ago when she first presented with symptoms of congestive heart failure and atrial fibrillation.  She was diagnosed with rheumatic mitral stenosis and underwent balloon valvuloplasty at [facility] in 2000.  Shortly after that she underwent DC cardioversion, but she quickly went back into atrial fibrillation.  She has been chronically anticoagulated using warfarin ever since.  She did well and has remained clinically stable until recently.  She has been followed for many years by Dr. Stanford Breed with serial echocardiograms.  Over the past several months to a year the patient has developed gradual progression of symptoms of exertional shortness of breath and fatigue.  Recent follow-up transthoracic and transesophageal echocardiograms documented the presence of recurrent severe mitral stenosis with mild to moderate mitral regurgitation, mild right ventricular chamber enlargement, and mild to moderate tricuspid regurgitation.  She was sent back to Dr. Mina Marble at [facility] to consider repeat balloon valvuloplasty.  However, after consultation she has now been referred for elective surgical intervention.  The patient is married and lives locally in [place] with her husband.  She has been retired for approximately 4 years having previously worked for [employer] and previously for [employer].  She has remained physically active and entirely functionally independent, although she admits that she does not exercise on a regular basis.  She states that she and her husband used to walk regularly, but over the past year so she has had to give this up because of worsening symptoms of exertional shortness of breath and fatigue.  She now gets short of breath and tired with moderate level activity.  She denies any resting shortness of breath, PND, orthopnea, or lower extremity edema.  She does not have any chest pain or chest tightness either with activity or at rest.  She reports occasional palpitations.  She has been chronically anticoagulated using warfarin for nearly 20 years.  She has not had any bleeding complications nor any history of TIA or stroke.  Past Medical History:  Diagnosis Date  . Atrial fibrillation ([place]) 08/26/2008  . Depression   . Hematuria    workup by Dr. Diona Fanti  . MITRAL REGURGITATION 08/26/2008  . Mitral stenosis 08/26/2008  . RHEUMATIC HEART DISEASE 08/26/2008  . Thyroid nodule     Past Surgical History:  Procedure Laterality Date  . ABDOMINAL AORTOGRAM N/A 03/04/2018   Procedure: ABDOMINAL AORTOGRAM;  Surgeon: Jettie Booze, MD;  Location: [place] [facility];  Service: Cardiovascular;  Laterality: N/A;  . CARDIOVERSION     2000  . CESAREAN SECTION    . COLONOSCOPY    . PERCUTANEOUS BALLOON VALVULOPLASTY  10/1998   [place]  . RIGHT/LEFT HEART CATH AND CORONARY ANGIOGRAPHY N/A 03/04/2018   Procedure: RIGHT/LEFT HEART CATH AND CORONARY ANGIOGRAPHY;  Surgeon: Jettie Booze, MD;  Location: [place] [facility];  Service: Cardiovascular;  Laterality: N/A;  . screw in ankles  1989   right  . TEE  WITHOUT CARDIOVERSION N/A 12/31/2017   Procedure: TRANSESOPHAGEAL ECHOCARDIOGRAM (TEE);  Surgeon: Lelon Perla, MD;  Location: [facility] ENDOSCOPY;  Service: Cardiovascular;  Laterality: N/A;  . THYROIDECTOMY  05/30/2012   Procedure:  THYROIDECTOMY;  Surgeon: Earnstine Regal, MD;  Location: [facility] ORS;  Service: General;  Laterality: N/A;  total thyroidectomy  . TONSILLECTOMY  1956  . TUBAL LIGATION      Family History  Problem Relation Age of Onset  . Lung cancer Father   . Cancer Father        lung  . Heart disease Mother        S/P CABG  . Cancer Brother        brain  . Hypertension Sister   . Heart disease Maternal Grandmother     Social History   Socioeconomic History  . Marital status: Married    Spouse name: Not on file  . Number of children: 2  . Years of education: Not on file  . Highest education level: Not on file  Occupational History  . Occupation: Airline pilot    Employer: [employer]  Social Needs  . Financial resource strain: Not on file  . Food insecurity:    Worry: Not on file    Inability: Not on file  . Transportation needs:    Medical: Not on file    Non-medical: Not on file  Tobacco Use  . Smoking status: Never Smoker  . Smokeless tobacco: Never Used  Substance and Sexual Activity  . Alcohol use: Yes    Alcohol/week: 1.0 standard drinks    Types: 1 Glasses of wine per week    Comment: occas glass of wine weekly  . Drug use: No  . Sexual activity: Yes    Partners: Male    Birth control/protection: Surgical, Post-menopausal    Comment: BTL  Lifestyle  . Physical activity:    Days per week: Not on file    Minutes per session: Not on file  . Stress: Not on file  Relationships  . Social connections:    Talks on phone: Not on file    Gets together: Not on file    Attends religious service: Not on file    Active member of club or organization: Not on file    Attends meetings of clubs or organizations: Not on file    Relationship status: Not on file  . Intimate partner violence:    Fear of current or ex partner: Not on file    Emotionally abused: Not on file    Physically abused: Not on file    Forced sexual activity: Not on file  Other Topics Concern  .  Not on file  Social History Narrative  . Not on file    Current Outpatient Medications  Medication Sig Dispense Refill  . amoxicillin (AMOXIL) 500 MG capsule Take 2,000 mg by mouth See admin instructions. Take 2000 mg by mouth 1 hour prior to dental appointment  2  . buPROPion (WELLBUTRIN XL) 300 MG 24 hr tablet TAKE 1 TABLET DAILY (Patient taking differently: Take 300 mg by mouth daily. ) 90 tablet 2  . diltiazem (CARDIZEM CD) 240 MG 24 hr capsule TAKE 1 CAPSULE (240 MG TOTAL) BY MOUTH DAILY. 90 capsule 3  . levothyroxine (SYNTHROID, LEVOTHROID) 125 MCG tablet Take 125 mcg by mouth daily before breakfast.   6  . Multiple Minerals-Vitamins (CALCIUM-MAGNESIUM-ZINC-D3 PO) Take 1 tablet by mouth daily.    . Multiple Vitamins-Minerals (MULTIVITAMIN WITH MINERALS)  tablet Take 1 tablet by mouth daily.     . Omega-3 Fatty Acids (FISH OIL) 1000 MG CAPS Take 1,000 mg by mouth daily.     Marland Kitchen warfarin (COUMADIN) 2.5 MG tablet Take 2.5-5 mg by mouth See admin instructions. Take 5 mg by mouth daily on Monday, Wednesday and Friday. Take 2.5 mg by mouth daily on all other days.     No current facility-administered medications for this visit.     No Known Allergies    Review of Systems:   General:  normal appetite, decreased energy, no weight gain, no weight loss, no fever  Cardiac:  no chest pain with exertion, no chest pain at rest, +SOB with exertion, no resting SOB, no PND, no orthopnea, + palpitations, + arrhythmia, + atrial fibrillation, no LE edema, no dizzy spells, no syncope  Respiratory:  + exertional shortness of breath, no home oxygen, no productive cough, no dry cough, no bronchitis, no wheezing, no hemoptysis, no asthma, no pain with inspiration or cough, no sleep apnea, no CPAP at night  GI:   no difficulty swallowing, no reflux, no frequent heartburn, no hiatal hernia, no abdominal pain, no constipation, no diarrhea, no hematochezia, no hematemesis, no melena  GU:   no dysuria,  no  frequency, no urinary tract infection, no hematuria, no kidney stones, no kidney disease  Vascular:  no pain suggestive of claudication, no pain in feet, no leg cramps, no varicose veins, no DVT, no non-healing foot ulcer  Neuro:   no stroke, no TIA's, no seizures, no headaches, no temporary blindness one eye,  no slurred speech, no peripheral neuropathy, no chronic pain, no instability of gait, no memory/cognitive dysfunction  Musculoskeletal: + mild arthritis in hands, no joint swelling, no myalgias, no difficulty walking, normal mobility   Skin:   no rash, no itching, no skin infections, no pressure sores or ulcerations  Psych:   no anxiety, no depression, no nervousness, no unusual recent stress  Eyes:   no blurry vision, + floaters, no recent vision changes, does not wears glasses or contacts  ENT:   no hearing loss, no loose or painful teeth, no dentures, last saw dentist within the past 3 months  Hematologic:  no easy bruising, no abnormal bleeding, no clotting disorder, no frequent epistaxis  Endocrine:  no diabetes, does not check CBG's at home           Physical Exam:   BP 130/80 (BP Location: Left Arm, Patient Position: Sitting, Cuff Size: Normal)   Pulse 96   Resp 18   Ht 5\' 5"  (1.651 m)   Wt 168 lb (76.2 kg)   LMP 05/07/2002 (Approximate)   SpO2 97% Comment: RA  BMI 27.96 kg/m   General:  Mildly obese,  well-appearing  HEENT:  Unremarkable   Neck:   no JVD, no bruits, no adenopathy   Chest:   clear to auscultation, symmetrical breath sounds, no wheezes, no rhonchi   CV:   Irregular rate and rhythm, grade II/VI systolic murmur heard best at LLSB,  no diastolic murmur  Abdomen:  soft, non-tender, no masses   Extremities:  warm, well-perfused, pulses diminished but palpable, no LE edema  Rectal/GU  Deferred  Neuro:   Grossly non-focal and symmetrical throughout  Skin:   Clean and dry, no rashes, no breakdown   Diagnostic Tests:  Transthoracic  Echocardiography  Patient:    Foy, Mungia MR #:       [id number] Study Date: 12/12/2017 Gender:  F Age:        58 Height:     165.1 cm Weight:     76.9 kg BSA:        1.9 m^2 Pt. Status: Room:   [place] Crenshaw  SONOGRAPHER  Wyatt Mage, [place], Outpatient  ATTENDING    Skeet Latch, MD  cc:  ------------------------------------------------------------------- LV EF: 55% -   60%  ------------------------------------------------------------------- Indications:      Mitral stenosis (I05.0).  ------------------------------------------------------------------- History:   PMH:   Dyspnea.  Atrial fibrillation.  Mitral valve disease.  ------------------------------------------------------------------- Study Conclusions  - Left ventricle: The cavity size was normal. Systolic function was   normal. The estimated ejection fraction was in the range of 55%   to 60%. Wall motion was normal; there were no regional wall   motion abnormalities. The study is not technically sufficient to   allow evaluation of LV diastolic function. Doppler parameters are   consistent with high ventricular filling pressure. - Aortic valve: Transvalvular velocity was within the normal range.   There was no stenosis. There was no regurgitation. - Mitral valve: Mobility was restricted. The findings are   consistent with severe stenosis. There was mild regurgitation.   Pressure half-time: 189 ms. Mean gradient (D): 11 mm Hg. Valve   area by pressure half-time: 1.11 cm^2. Valve area by continuity   equation (using LVOT flow): 0.75 cm^2. - Left atrium: The atrium was severely dilated. - Right ventricle: The cavity size was normal. Wall thickness was   normal. Systolic function was normal. - Tricuspid valve: There was mild regurgitation. - Pulmonary arteries: Systolic pressure was mildly increased. PA   peak pressure: 43 mm Hg  (S).  Impressions:  - Compared with the echo 07/31/16, the mitral stenosis severity has   worsened and is now severe.  ------------------------------------------------------------------- Labs, prior tests, procedures, and surgery: Transthoracic echocardiography (07/31/2016).    The mitral valve showed moderate to severe stenosis and mild regurgitation.  EF was 55% and PA pressure was 29 (systolic). Mitral valve: mean gradient of 8 mm Hg.  ------------------------------------------------------------------- Study data:  Comparison was made to the study of 07/31/2016.  Study status:  Routine.  Procedure:  The patient reported no pain pre or post test. Transthoracic echocardiography. Image quality was adequate.  Study completion:  There were no complications. Transthoracic echocardiography.  M-mode, complete 2D, spectral Doppler, and color Doppler.  Birthdate:  Patient birthdate: [birth year].  Age:  Patient is 67 yr old.  Sex:  Gender: female. BMI: 28.2 kg/m^2.  Blood pressure:     122/74  Patient status: Outpatient.  Study date:  Study date: 12/12/2017. Study time: 03:52 PM.  Location:  [facility]  -------------------------------------------------------------------  ------------------------------------------------------------------- Left ventricle:  The cavity size was normal. Systolic function was normal. The estimated ejection fraction was in the range of 55% to 60%. Wall motion was normal; there were no regional wall motion abnormalities. The study is not technically sufficient to allow evaluation of LV diastolic function. Doppler parameters are consistent with high ventricular filling pressure.  ------------------------------------------------------------------- Aortic valve:   Trileaflet; normal thickness leaflets. Mobility was not restricted.  Doppler:  Transvalvular velocity was within the normal range. There was no stenosis. There was no regurgitation.    ------------------------------------------------------------------- Aorta:  Aortic root: The aortic root was normal in size.  ------------------------------------------------------------------- Mitral valve:  Severely thickened leaflets anterior greater than posterior. Hockey stick movement  of the anterior mitral valve leaflet consistent with Rheumatic heart disease. Mobility was restricted.  Doppler:   The findings are consistent with severe stenosis.   There was mild regurgitation.    Valve area by pressure half-time: 1.11 cm^2. Indexed valve area by pressure half-time: 0.58 cm^2/m^2. Valve area by continuity equation (using LVOT flow): 0.75 cm^2. Indexed valve area by continuity equation (using LVOT flow): 0.4 cm^2/m^2.    Mean gradient (D): 11 mm Hg. Peak gradient (D): 16 mm Hg.  ------------------------------------------------------------------- Left atrium:  The atrium was severely dilated.  ------------------------------------------------------------------- Right ventricle:  The cavity size was normal. Wall thickness was normal. Systolic function was normal.  ------------------------------------------------------------------- Pulmonic valve:    Structurally normal valve.   Cusp separation was normal.  Doppler:  Transvalvular velocity was within the normal range. There was no evidence for stenosis. There was mild regurgitation.  ------------------------------------------------------------------- Tricuspid valve:   Structurally normal valve.    Doppler: Transvalvular velocity was within the normal range. There was mild regurgitation.  ------------------------------------------------------------------- Pulmonary artery:   The main pulmonary artery was normal-sized. Systolic pressure was mildly increased.  ------------------------------------------------------------------- Right atrium:  The atrium was normal in  size.  ------------------------------------------------------------------- Pericardium:  There was no pericardial effusion.  ------------------------------------------------------------------- Systemic veins: Inferior vena cava: The vessel was normal in size. The respirophasic diameter changes were blunted (< 50%), consistent with normal central venous pressure.  ------------------------------------------------------------------- Measurements   Left ventricle                            Value          Reference  LV ID, ED, PLAX chordal                   46    mm       43 - 52  LV ID, ES, PLAX chordal                   32    mm       23 - 38  LV fx shortening, PLAX chordal            30    %        >=29  LV PW thickness, ED                       10    mm       ---------  IVS/LV PW ratio, ED                       0.9            <=1.3  Stroke volume, 2D                         52    ml       ---------  Stroke volume/bsa, 2D                     27    ml/m^2   ---------  LV e&', lateral                            7.42  cm/s     ---------  LV E/e&', lateral  26.68          ---------  LV e&', medial                             4.88  cm/s     ---------  LV E/e&', medial                           40.57          ---------  LV e&', average                            6.15  cm/s     ---------  LV E/e&', average                          32.2           ---------    Ventricular septum                        Value          Reference  IVS thickness, ED                         9     mm       ---------    LVOT                                      Value          Reference  LVOT ID, S                                20    mm       ---------  LVOT area                                 3.14  cm^2     ---------  LVOT peak velocity, S                     84.2  cm/s     ---------  LVOT mean velocity, S                     54.2  cm/s     ---------  LVOT VTI, S                                16.7  cm       ---------    Aorta                                     Value          Reference  Aortic root ID, ED                        30    mm       ---------    Left  atrium                               Value          Reference  LA ID, A-P, ES                            61    mm       ---------  LA ID/bsa, A-P                    (H)     3.21  cm/m^2   <=2.2  LA volume, ES, 1-p A4C                    53    ml       ---------  LA volume/bsa, ES, 1-p A4C                27.9  ml/m^2   ---------  LA volume, ES, 1-p A2C                    113   ml       ---------  LA volume/bsa, ES, 1-p A2C                59.5  ml/m^2   ---------    Mitral valve                              Value          Reference  Mitral E-wave peak velocity               198   cm/s     ---------  Mitral A-wave peak velocity               2.06  cm/s     ---------  Mitral mean velocity, D                   157   cm/s     ---------  Mitral deceleration time          (H)     687   ms       150 - 230  Mitral pressure half-time                 189   ms       ---------  Mitral mean gradient, D                   11    mm Hg    ---------  Mitral peak gradient, D                   16    mm Hg    ---------  Mitral E/A ratio, peak                    94.2           ---------  Mitral valve area, PHT, DP                1.11  cm^2     ---------  Mitral valve area/bsa, PHT, DP            0.58  cm^2/m^2 ---------  Mitral valve area, LVOT  0.75  cm^2     ---------  continuity  Mitral valve area/bsa, LVOT               0.4   cm^2/m^2 ---------  continuity  Mitral annulus VTI, D                     69.5  cm       ---------  Mitral regurg VTI, PISA                   135   cm       ---------    Pulmonary arteries                        Value          Reference  PA pressure, S, DP                (H)     43    mm Hg    <=30    Tricuspid valve                           Value          Reference  Tricuspid regurg  peak velocity            295   cm/s     ---------  Tricuspid peak RV-RA gradient             35    mm Hg    ---------    Systemic veins                            Value          Reference  Estimated CVP                             8     mm Hg    ---------    Right ventricle                           Value          Reference  TAPSE                                     22.8  mm       ---------  RV pressure, S, DP                (H)     43    mm Hg    <=30  RV s&', lateral, S                         12    cm/s     ---------    Pulmonic valve                            Value          Reference  Pulmonic regurg velocity, ED              150   cm/s     ---------  Pulmonic regurg gradient, ED  9     mm Hg    ---------  Legend: (L)  and  (H)  mark values outside specified reference range.  ------------------------------------------------------------------- Prepared and Electronically Authenticated by  Skeet Latch, MD 2019-08-08T19:27:41   Transesophageal Echocardiography  Patient:    Lorraine Kemp, Lorraine Kemp MR #:       [id number] Study Date: 12/31/2017 Gender:     F Age:        33 Height:     165.1 cm Weight:     76.2 kg BSA:        1.89 m^2 Pt. Status: Room:   [place] Crenshaw  SONOGRAPHER  Haroldine Laws  cc:  ------------------------------------------------------------------- LV EF: 55% -   60%  ------------------------------------------------------------------- Indications:      Mitral stenosis [non-rheumatic] 424.0.  Mitral regurgitation 424.0.  ------------------------------------------------------------------- History:   PMH:  Mitral regurgitation. Rheumatic heart disease. Dyspnea.  Atrial fibrillation.  Mitral stenosis.  ------------------------------------------------------------------- Study Conclusions  - Left  ventricle: Systolic function was normal. The estimated   ejection fraction was in the range of 55% to 60%. Wall motion was   normal; there were no regional wall motion abnormalities. - Aortic valve: No evidence of vegetation. No evidence of   vegetation. - Mitral valve: Moderate thickening, consistent with rheumatic   disease. The findings are consistent with severe stenosis. There   was mild to moderate regurgitation. Valve area by pressure   half-time: 0.96 cm^2. - Left atrium: The atrium was severely dilated. There was mild   spontaneous echo contrast (&quot;smoke&quot;) in the cavity and the   appendage. - Right ventricle: The cavity size was mildly dilated. - Right atrium: The atrium was moderately dilated. - Atrial septum: No defect or patent foramen ovale was identified. - Tricuspid valve: No evidence of vegetation. There was   mild-moderate regurgitation. - Pulmonic valve: No evidence of vegetation.  Impressions:  - Normal LV systolic function; severe LAE; moderate RAE; mild RVE;   rheumatic MV with severe MS (mean gradient 6 mmHg; MVA 1 cm2 by   pressure half time; MVA by planimetry 0.95 cm2); mild to moderate   MR; mild to moderate TR.  ------------------------------------------------------------------- Study data:   Study status:  Routine.  Consent:  The risks, benefits, and alternatives to the procedure were explained to the patient and informed consent was obtained.  Procedure:  The patient reported no pain pre or post test. Initial setup. The patient was brought to the laboratory. Surface ECG leads were monitored. Sedation. Conscious sedation was administered by cardiology staff. Transesophageal echocardiography. An adult multiplane transesophageal probe was inserted by the attending cardiologistwithout difficulty. Image quality was adequate.  Study completion:  The patient tolerated the procedure well. There were no complications.  Administered medications:    Midazolam, 5mg , IV. Fentanyl, 77mcg, IV.          Diagnostic transesophageal echocardiography.  2D and color Doppler.  Birthdate:  Patient birthdate: February 23, 1951.  Age:  Patient is 67 yr old.  Sex:  Gender: female.    BMI: 28 kg/m^2.  Blood pressure:     100/46  Patient status:  Inpatient.  Study date:  Study date: 12/31/2017. Study time: 08:48 AM.  Location:  Endoscopy.  -------------------------------------------------------------------  ------------------------------------------------------------------- Left ventricle:  Systolic function was normal. The estimated ejection fraction was in the range of 55%  to 60%. Wall motion was normal; there were no regional wall motion abnormalities.  ------------------------------------------------------------------- Aortic valve:   Structurally normal valve. Trileaflet. Cusp separation was normal.  No evidence of vegetation.  No evidence of vegetation.  Doppler:  There was no regurgitation.  ------------------------------------------------------------------- Aorta:  Descending aorta: The descending aorta had moderate diffuse disease.  ------------------------------------------------------------------- Mitral valve:   Moderate thickening, consistent with rheumatic disease.  Doppler:   The findings are consistent with severe stenosis.   There was mild to moderate regurgitation.    Valve area by pressure half-time: 0.96 cm^2. Indexed valve area by pressure half-time: 0.51 cm^2/m^2.    Mean gradient (D): 6 mm Hg.  ------------------------------------------------------------------- Left atrium:  The atrium was severely dilated. There was mild spontaneous echo contrast (&quot;smoke&quot;) in the cavity and the appendage.  ------------------------------------------------------------------- Atrial septum:  No defect or patent foramen ovale was identified.   ------------------------------------------------------------------- Right  ventricle:  The cavity size was mildly dilated. Systolic function was normal.  ------------------------------------------------------------------- Pulmonic valve:    Structurally normal valve.   Cusp separation was normal.  No evidence of vegetation.  Doppler:  There was mild regurgitation.  ------------------------------------------------------------------- Tricuspid valve:   Structurally normal valve.   Leaflet separation was normal.  No evidence of vegetation.  Doppler:  There was mild-moderate regurgitation.  ------------------------------------------------------------------- Right atrium:  The atrium was moderately dilated.  ------------------------------------------------------------------- Pericardium:  There was no pericardial effusion.  ------------------------------------------------------------------- Measurements   Mitral valve                        Value  Mitral mean velocity, D             114   cm/s  Mitral pressure half-time           237   ms  Mitral mean gradient, D             6     mm Hg  Mitral valve area, PHT, DP          0.96  cm^2  Mitral valve area/bsa, PHT, DP      0.51  cm^2/m^2  Mitral annulus VTI, D               56.8  cm  Legend: (L)  and  (H)  mark values outside specified reference range.  ------------------------------------------------------------------- Prepared and Electronically Authenticated by  Kirk Ruths 2019-08-27T15:28:25   ABDOMINAL AORTOGRAM  RIGHT/LEFT HEART CATH AND CORONARY ANGIOGRAPHY  Conclusion     Mid LAD lesion is 25% stenosed.  Prox LAD lesion is 25% stenosed.  LV end diastolic pressure is normal.  There is no aortic valve stenosis.  Nonobstructive coronary artery disease.  CO 5.37 L/min; CI 2.94; Ao sat 98%, PA sat 73%; PA pressure 33/15, mean PA 22 mm Hg; mean PCWP 16 mm Hg  No significant aortoiliac disease noted on abdominal aortogram.  Mitral valve area calculated: 1.76 cm2    Continue with plan for mitral valve surgery.   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 The patient verbalized understanding and wanted to proceed. Informed written consent was obtained.  PROCEDURE TECHNIQUE: After Xylocaine anesthesia, a 5 French sheath was placed in the right antecubital area in exchange for a peripheral IV. A 5 French balloontipped Swan-Ganz catheter was advanced to the pulmonary artery under fluoroscopic guidance. Hemodynamic pressures were obtained.  Oxygen saturations were obtained. After Xylocaine anesthesia, a 39F sheath was placed in the right radial artery with a single anterior needle wall stick using ultrasound guidance. An image was captured and stored. Left heart cath and Right coronary angiography was done using a Judkins R4 guide catheter. Left coronary angiography was done using a Judkins L3.5 guide catheter. Abdominal aortography was done using a pigtail catheter.     Contrast: 90 cc   Estimated blood loss <50 mL.  During this procedure the patient was administered the following to achieve and maintain moderate conscious sedation: Versed 2 mg, Fentanyl 25 mcg, while the patient's heart rate, blood pressure, and oxygen saturation were continuously monitored. The period of conscious sedation was 36 minutes, of which I was present face-to-face 100% of this time.  Complications   Complications documented before study signed (03/04/2018 8:33 AM EDT)    No complications were associated with this study.  Documented by Jettie Booze, MD - 03/04/2018 8:31 AM EDT    Coronary Findings   Diagnostic  Dominance: Right  Left Anterior Descending  Prox LAD lesion 25% stenosed  Prox LAD lesion is 25% stenosed.  Mid LAD lesion 25% stenosed  Mid LAD lesion is 25% stenosed.  Intervention   No interventions  have been documented.  Right Heart   Right Heart Pressures CO 5.37 L/min; CI 2.94; Ao sat 98%, PA sat 73%; PA pressure 33/15, mean PA 22 mm Hg; mean PCWP 16 mm Hg  Left Heart   Left Ventricle The left ventricular size is normal. LV end diastolic pressure is normal.  Aortic Valve There is no aortic valve stenosis.  Coronary Diagrams   Diagnostic Diagram       Implants    No implant documentation for this case.  MERGE Images   Show images for CARDIAC CATHETERIZATION   Link to Procedure Log   Procedure Log    Hemo Data    Most Recent Value  Fick Cardiac Output 5.37 L/min  Fick Cardiac Output Index 2.94 (L/min)/BSA  Mitral Mean Gradient 8.61 mmHg  Mitral Peak Gradient 9 mmHg  Mitral Valve Area Index 0.96 cm2/BSA  RA A Wave 6 mmHg  RA V Wave 8 mmHg  RA Mean 6 mmHg  RV Systolic Pressure 35 mmHg  RV Diastolic Pressure 2 mmHg  RV EDP 6 mmHg  PA Systolic Pressure 33 mmHg  PA Diastolic Pressure 15 mmHg  PA Mean 22 mmHg  PW A Wave 18 mmHg  PW V Wave 26 mmHg  PW Mean 19 mmHg  AO Systolic Pressure 188 mmHg  AO Diastolic Pressure 64 mmHg  AO Mean 87 mmHg  LV Systolic Pressure 416 mmHg  LV Diastolic Pressure 2 mmHg  LV EDP 10 mmHg  AOp Systolic Pressure 606 mmHg  AOp Diastolic Pressure 68 mmHg  AOp Mean Pressure 90 mmHg  LVp Systolic Pressure 301 mmHg  LVp Diastolic Pressure 9 mmHg  LVp EDP Pressure 12 mmHg  QP/QS 1  TPVR Index 7.47 HRUI  TSVR Index 29.56 HRUI  PVR SVR Ratio 0.07  TPVR/TSVR Ratio 0.25      Impression:  Patient has rheumatic heart disease with recurrent severe symptomatic mitral stenosis and mild mitral regurgitation status post balloon mitral valvuloplasty nearly 20 years ago with long-standing persistent/permanent atrial fibrillation.  She describes a slow gradual progression of symptoms of exertional shortness of breath and fatigue consistent with chronic diastolic congestive heart failure, New York Heart Association functional class IIb.  I  have personally reviewed the  patient's recent echocardiograms and diagnostic cardiac catheterization.  Echocardiograms demonstrate the presence of classical rheumatic mitral valve disease with severe mitral stenosis and mild mitral regurgitation.  There is thickening with severely restricted leaflet mobility involving both leaflets of the mitral valve.  There is thickening and foreshortening of the subvalvular apparatus.  There is not a large amount of calcification.  There is severe left and right atrial enlargement.  Left ventricular systolic function appears normal.  The aortic valve is normal.  Right ventricle is mildly dilated.  There is mild tricuspid regurgitation.  The tricuspid annulus does not appear to be significantly dilated.  I agree the patient would benefit from elective mitral valve replacement.  Repeat balloon mitral valvuloplasty could be considered but would be associated with the potential for creating significant mitral regurgitation, unclear amount of symptomatic improvement, and likely poor long-term result.  Risks associated with mitral valve replacement should be relatively low.  Patient might benefit from concomitant Maze procedure, although the likelihood of restoration of sinus rhythm would be needed by the presence of severe biatrial enlargement and long-standing persistent atrial fibrillation.   Plan:  The patient and her husband were counseled at length regarding the indications, risks and potential benefits of mitral valve replacement.  The rationale for elective surgery has been explained, including a comparison between surgery and continued medical therapy with close follow-up.  We discussed the possibility of replacing the mitral valve using a mechanical prosthesis with the attendant need for long-term anticoagulation versus the alternative of replacing it using a bioprosthetic tissue valve with its potential for late structural valve deterioration and failure, depending  upon the patient's longevity.  The patient specifically requests that if the mitral valve must be replaced that it be done using a bioprosthetic tissue valve.   The relative risks and benefits of performing a maze procedure at the time of their surgery was discussed at length, including the expected likelihood of long term freedom from recurrent symptomatic atrial fibrillation and/or atrial flutter.  Alternative surgical approaches have been discussed including a comparison between conventional sternotomy and minimally-invasive techniques.  The relative risks and benefits of each have been reviewed as they pertain to the patient's specific circumstances, and all of their questions have been addressed.  The patient hopes to proceed with mitral valve replacement and Maze procedure in the near future.  We tentatively plan for surgery on March 27, 2018.  The patient will undergo CT angiography to further evaluate the feasibility of peripheral cannulation for surgery.  The patient will return to our office for follow-up prior to surgery on March 24, 2018.  She has been instructed to stop taking warfarin on March 21, 2018.  She will begin Lovenox injections the following day for bridging therapy.   I spent in excess of 90 minutes during the conduct of this office consultation and >50% of this time involved direct face-to-face encounter with the patient for counseling and/or coordination of their care.    Valentina Gu. Roxy Manns, MD 03/07/2018 11:49 AM

## 2018-03-13 ENCOUNTER — Encounter: Payer: Medicare HMO | Admitting: Thoracic Surgery (Cardiothoracic Vascular Surgery)

## 2018-03-17 ENCOUNTER — Ambulatory Visit
Admission: RE | Admit: 2018-03-17 | Discharge: 2018-03-17 | Disposition: A | Discharge status: Home / Self Care | Payer: Medicare HMO | Source: Ambulatory Visit | Attending: Thoracic Surgery (Cardiothoracic Vascular Surgery) | Admitting: Thoracic Surgery (Cardiothoracic Vascular Surgery)

## 2018-03-17 DIAGNOSIS — I7409 Other arterial embolism and thrombosis of abdominal aorta: Secondary | ICD-10-CM

## 2018-03-17 DIAGNOSIS — Z01818 Encounter for other preprocedural examination: Secondary | ICD-10-CM

## 2018-03-17 DIAGNOSIS — I050 Rheumatic mitral stenosis: Secondary | ICD-10-CM

## 2018-03-17 MED ORDER — IOPAMIDOL (ISOVUE-370) INJECTION 76%
75.0000 mL | Freq: Once | INTRAVENOUS | Status: AC | PRN
  Administered 2018-03-17: 75 mL via INTRAVENOUS

## 2018-03-24 NOTE — Pre-Procedure Instructions (Signed)
SUSI GOSLIN  03/24/2018      [id number] - [place], [place] - [address] [place] [address] Phone: [phone] Fax: [phone]    Your procedure is scheduled on Fri., Nov. 22, 2019 from 7:30AM-2:54pm  Report to [facility] Admitting Entrance "A" at 5:30AM  Call this number if you have problems the morning of surgery:  [phone]   Remember:  Do not eat or drink after midnight on Nov. 21st    Take these medicines the morning of surgery with A SIP OF WATER: BuPROPion (WELLBUTRIN XL), Diltiazem (CARDIZEM CD), and Levothyroxine (SYNTHROID, LEVOTHROID)   Per Dr. Roxy Manns, stop taking Warfarin (COUMADIN) on 03/21/18, and begin Lovenox bridge as directed.  As of today, stop taking all Other Aspirin Products, Vitamins, Fish oils, and Herbal medications. Also stop all NSAIDS i.e. Advil, Ibuprofen, Motrin, Aleve, Anaprox, Naproxen, BC, Goody Powders, and all Supplements.    Do not wear jewelry, make-up or nail polish.  Do not wear lotions, powders, or perfumes, or deodorant.  Do not shave 48 hours prior to surgery.    Do not bring valuables to the hospital.  [facility] is not responsible for any belongings or valuables.  Contacts, dentures or bridgework may not be worn into surgery.  Leave your suitcase in the car.  After surgery it may be brought to your room.  For patients admitted to the hospital, discharge time will be determined by your treatment team.  Patients discharged the day of surgery will not be allowed to drive home.   Special instructions:  [facility]- Preparing For Surgery  Before surgery, you can play an important role. Because skin is not sterile, your skin needs to be as free of germs as possible. You can reduce the number of germs on your skin by washing with CHG (chlorahexidine gluconate) Soap before surgery.  CHG is an antiseptic cleaner which kills germs and bonds with the  skin to continue killing germs even after washing.    Oral Hygiene is also important to reduce your risk of infection.  Remember - BRUSH YOUR TEETH THE MORNING OF SURGERY WITH YOUR REGULAR TOOTHPASTE  Please do not use if you have an allergy to CHG or antibacterial soaps. If your skin becomes reddened/irritated stop using the CHG.  Do not shave (including legs and underarms) for at least 48 hours prior to first CHG shower. It is OK to shave your face.  Please follow these instructions carefully.   1. Shower the NIGHT BEFORE SURGERY and the MORNING OF SURGERY with CHG.   2. If you chose to wash your hair, wash your hair first as usual with your normal shampoo.  3. After you shampoo, rinse your hair and body thoroughly to remove the shampoo.  4. Use CHG as you would any other liquid soap. You can apply CHG directly to the skin and wash gently with a scrungie or a clean washcloth.   5. Apply the CHG Soap to your body ONLY FROM THE NECK DOWN.  Do not use on open wounds or open sores. Avoid contact with your eyes, ears, mouth and genitals (private parts). Wash Face and genitals (private parts)  with your normal soap.  6. Wash thoroughly, paying special attention to the area where your surgery will be performed.  7. Thoroughly rinse your body with warm water from the neck down.  8. DO NOT shower/wash with your  normal soap after using and rinsing off the CHG Soap.  9. Pat yourself dry with a CLEAN TOWEL.  10. Wear CLEAN PAJAMAS to bed the night before surgery, wear comfortable clothes the morning of surgery  11. Place CLEAN SHEETS on your bed the night of your first shower and DO NOT SLEEP WITH PETS.  Day of Surgery:  Do not apply any deodorants/lotions.  Please wear clean clothes to the hospital/surgery center.   Remember to brush your teeth WITH YOUR REGULAR TOOTHPASTE.  Please read over the following fact sheets that you were given. Pain Booklet, Coughing and Deep Breathing, MRSA  Information and Surgical Site Infection Prevention

## 2018-03-25 ENCOUNTER — Ambulatory Visit: Payer: Medicare HMO | Admitting: Thoracic Surgery (Cardiothoracic Vascular Surgery)

## 2018-03-25 ENCOUNTER — Ambulatory Visit (HOSPITAL_COMMUNITY)
Admission: RE | Admit: 2018-03-25 | Discharge: 2018-03-25 | Disposition: A | Discharge status: Home / Self Care | Payer: Medicare HMO | Source: Ambulatory Visit | Attending: Thoracic Surgery (Cardiothoracic Vascular Surgery) | Admitting: Thoracic Surgery (Cardiothoracic Vascular Surgery)

## 2018-03-25 ENCOUNTER — Other Ambulatory Visit: Payer: Self-pay

## 2018-03-25 ENCOUNTER — Ambulatory Visit (HOSPITAL_BASED_OUTPATIENT_CLINIC_OR_DEPARTMENT_OTHER)
Admission: RE | Admit: 2018-03-25 | Discharge: 2018-03-25 | Disposition: A | Discharge status: Home / Self Care | Payer: Medicare HMO | Source: Ambulatory Visit | Attending: Thoracic Surgery (Cardiothoracic Vascular Surgery) | Admitting: Thoracic Surgery (Cardiothoracic Vascular Surgery)

## 2018-03-25 ENCOUNTER — Encounter (HOSPITAL_COMMUNITY)
Admission: RE | Admit: 2018-03-25 | Discharge: 2018-03-25 | Disposition: A | Discharge status: Home / Self Care | Payer: Medicare HMO | Source: Ambulatory Visit | Attending: Thoracic Surgery (Cardiothoracic Vascular Surgery) | Admitting: Thoracic Surgery (Cardiothoracic Vascular Surgery)

## 2018-03-25 ENCOUNTER — Encounter: Payer: Self-pay | Admitting: Thoracic Surgery (Cardiothoracic Vascular Surgery)

## 2018-03-25 ENCOUNTER — Encounter (HOSPITAL_COMMUNITY): Payer: Self-pay

## 2018-03-25 VITALS — BP 105/58 | HR 74 | Resp 16 | Ht 65.0 in | Wt 170.0 lb

## 2018-03-25 DIAGNOSIS — I4821 Permanent atrial fibrillation: Secondary | ICD-10-CM

## 2018-03-25 DIAGNOSIS — I4891 Unspecified atrial fibrillation: Secondary | ICD-10-CM

## 2018-03-25 DIAGNOSIS — I050 Rheumatic mitral stenosis: Secondary | ICD-10-CM

## 2018-03-25 DIAGNOSIS — I080 Rheumatic disorders of both mitral and aortic valves: Secondary | ICD-10-CM

## 2018-03-25 DIAGNOSIS — I099 Rheumatic heart disease, unspecified: Secondary | ICD-10-CM

## 2018-03-25 HISTORY — DX: Adverse effect of unspecified anesthetic, initial encounter: T41.45XA

## 2018-03-25 HISTORY — DX: Other complications of anesthesia, initial encounter: T88.59XA

## 2018-03-25 HISTORY — DX: Fracture of unspecified carpal bone, unspecified wrist, initial encounter for closed fracture: S62.109A

## 2018-03-25 HISTORY — DX: Other specified postprocedural states: Z98.890

## 2018-03-25 HISTORY — DX: Nausea with vomiting, unspecified: R11.2

## 2018-03-25 LAB — PULMONARY FUNCTION TEST
DL/VA % pred: 75 %
DL/VA: 3.7 ml/min/mmHg/L
DLCO cor % pred: 59 %
DLCO cor: 15.36 ml/min/mmHg
DLCO unc % pred: 59 %
DLCO unc: 15.36 ml/min/mmHg
FEF 25-75 PRE: 3.18 L/s
FEF 25-75 Post: 4.07 L/sec
FEF2575-%CHANGE-POST: 27 %
FEF2575-%Pred-Post: 202 %
FEF2575-%Pred-Pre: 158 %
FEV1-%CHANGE-POST: 7 %
FEV1-%PRED-PRE: 93 %
FEV1-%Pred-Post: 100 %
FEV1-PRE: 2.24 L
FEV1-Post: 2.4 L
FEV1FVC-%Change-Post: 0 %
FEV1FVC-%Pred-Pre: 115 %
FEV6-%Change-Post: 9 %
FEV6-%Pred-Post: 89 %
FEV6-%Pred-Pre: 82 %
FEV6-PRE: 2.48 L
FEV6-Post: 2.71 L
FEV6FVC-%PRED-PRE: 104 %
FEV6FVC-%Pred-Post: 104 %
FVC-%CHANGE-POST: 6 %
FVC-%PRED-POST: 85 %
FVC-%Pred-Pre: 80 %
FVC-POST: 2.71 L
FVC-Pre: 2.55 L
POST FEV1/FVC RATIO: 89 %
PRE FEV6/FVC RATIO: 100 %
Post FEV6/FVC ratio: 100 %
Pre FEV1/FVC ratio: 88 %
RV % PRED: 88 %
RV: 1.97 L
TLC % pred: 91 %
TLC: 4.76 L

## 2018-03-25 LAB — HEMOGLOBIN A1C
HEMOGLOBIN A1C: 5.2 % (ref 4.8–5.6)
MEAN PLASMA GLUCOSE: 102.54 mg/dL

## 2018-03-25 LAB — URINALYSIS, ROUTINE W REFLEX MICROSCOPIC
Bilirubin Urine: NEGATIVE
GLUCOSE, UA: NEGATIVE mg/dL
Hgb urine dipstick: NEGATIVE
Ketones, ur: NEGATIVE mg/dL
Leukocytes, UA: NEGATIVE
Nitrite: NEGATIVE
Protein, ur: NEGATIVE mg/dL
SPECIFIC GRAVITY, URINE: 1.018 (ref 1.005–1.030)
pH: 5 (ref 5.0–8.0)

## 2018-03-25 LAB — COMPREHENSIVE METABOLIC PANEL
ALBUMIN: 4 g/dL (ref 3.5–5.0)
ALK PHOS: 60 U/L (ref 38–126)
ALT: 28 U/L (ref 0–44)
AST: 41 U/L (ref 15–41)
Anion gap: 9 (ref 5–15)
BILIRUBIN TOTAL: 1.1 mg/dL (ref 0.3–1.2)
BUN: 14 mg/dL (ref 8–23)
CO2: 20 mmol/L — AB (ref 22–32)
Calcium: 9.6 mg/dL (ref 8.9–10.3)
Chloride: 108 mmol/L (ref 98–111)
Creatinine, Ser: 1.03 mg/dL — ABNORMAL HIGH (ref 0.44–1.00)
GFR calc Af Amer: >60 mL/min (ref >60)
GFR calc non Af Amer: 54 mL/min — ABNORMAL LOW (ref >60)
Glucose, Bld: 101 mg/dL — ABNORMAL HIGH (ref 70–99)
Potassium: 4.4 mmol/L (ref 3.5–5.1)
SODIUM: 137 mmol/L (ref 135–145)
Total Protein: 7.4 g/dL (ref 6.5–8.1)

## 2018-03-25 LAB — CBC
HEMATOCRIT: 42.3 % (ref 36.0–46.0)
HEMOGLOBIN: 13.4 g/dL (ref 12.0–15.0)
MCH: 28.9 pg (ref 26.0–34.0)
MCHC: 31.7 g/dL (ref 30.0–36.0)
MCV: 91.2 fL (ref 80.0–100.0)
NRBC: 0 % (ref 0.0–0.2)
Platelets: 234 10*3/uL (ref 150–400)
RBC: 4.64 MIL/uL (ref 3.87–5.11)
RDW: 13.6 % (ref 11.5–15.5)
WBC: 6.5 10*3/uL (ref 4.0–10.5)

## 2018-03-25 LAB — TYPE AND SCREEN
ABO/RH(D): B NEG
Antibody Screen: NEGATIVE

## 2018-03-25 LAB — SURGICAL PCR SCREEN
MRSA, PCR: NEGATIVE
Staphylococcus aureus: NEGATIVE

## 2018-03-25 LAB — BLOOD GAS, ARTERIAL
ACID-BASE DEFICIT: 0.5 mmol/L (ref 0.0–2.0)
Bicarbonate: 23.4 mmol/L (ref 20.0–28.0)
Drawn by: 421801
FIO2: 21
O2 Saturation: 97.6 %
PCO2 ART: 36.5 mmHg (ref 32.0–48.0)
Patient temperature: 98.6
pH, Arterial: 7.422 (ref 7.350–7.450)
pO2, Arterial: 97.8 mmHg (ref 83.0–108.0)

## 2018-03-25 LAB — PROTIME-INR
INR: 1.03
PROTHROMBIN TIME: 13.4 s (ref 11.4–15.2)

## 2018-03-25 LAB — ABO/RH: ABO/RH(D): B NEG

## 2018-03-25 LAB — APTT: aPTT: 36 seconds (ref 24–36)

## 2018-03-25 MED ORDER — ALBUTEROL SULFATE (2.5 MG/3ML) 0.083% IN NEBU
2.5000 mg | INHALATION_SOLUTION | Freq: Once | RESPIRATORY_TRACT | Status: AC
  Administered 2018-03-25: 2.5 mg via RESPIRATORY_TRACT

## 2018-03-25 NOTE — Patient Instructions (Signed)
Stop taking Lovenox injections after you take your shot on Thursday morning 11/21  Continue taking all other medications without change through the day before surgery.  Have nothing to eat or drink after midnight the night before surgery.  On the morning of surgery take only Synthroid with a sip of water.

## 2018-03-25 NOTE — Progress Notes (Signed)
PCP - Dr. Anastasia Pall Cardiologist - Dr. Kirk Ruths  Chest x-ray - 03/25/18 EKG - 03/04/18 Stress Test - 10+ years ago at [facility] - 12/31/17 Cardiac Cath - 03/04/18  Sleep Study - denies  Blood Thinner Instructions: LD of coumadin 03/21/18; currently on lovenox bridge.   Anesthesia review: Yes  Patient denies shortness of breath, fever, cough and chest pain at PAT appointment   Patient verbalized understanding of instructions that were given to them at the PAT appointment. Patient was also instructed that they will need to review over the PAT instructions again at home before surgery.

## 2018-03-25 NOTE — Progress Notes (Signed)
[address]       [place] [address]             [phone]     CARDIOTHORACIC SURGERY OFFICE NOTE  Referring Provider is Stanford Breed, Denice Bors, MD PCP is Chesley Noon, MD   HPI:  Patient returns the office today with tentative plans to proceed with minimally invasive mitral valve replacement and Maze procedure later this week.  She was originally seen in consultation on March 07, 2018.  She reports no new problems or complaints.  She describes stable symptoms of exertional shortness of breath and fatigue consistent with symptoms of chronic diastolic congestive heart failure, New York Heart Association functional class II.  She denies any recent fever, chills, or productive cough.  Appetite is normal.  Remainder of her review of systems is unchanged from previously.  She is looking forward to proceeding with definitive surgical intervention.  She stopped taking Coumadin last week and is currently on Lovenox injections twice daily for bridging therapy.   Current Outpatient Medications  Medication Sig Dispense Refill  . amoxicillin (AMOXIL) 500 MG capsule Take 2,000 mg by mouth See admin instructions. Take 2000 mg by mouth 1 hour prior to dental appointment  2  . buPROPion (WELLBUTRIN XL) 300 MG 24 hr tablet TAKE 1 TABLET DAILY (Patient taking differently: Take 300 mg by mouth daily. ) 90 tablet 2  . diltiazem (CARDIZEM CD) 240 MG 24 hr capsule TAKE 1 CAPSULE (240 MG TOTAL) BY MOUTH DAILY. 90 capsule 3  . Enoxaparin Sodium (LOVENOX [place]) Inject 1 Syringe into the skin 2 (two) times daily.    Marland Kitchen levothyroxine (SYNTHROID, LEVOTHROID) 112 MCG tablet Take 112 mcg by mouth daily before breakfast.   6  . Multiple Minerals-Vitamins (CALCIUM-MAGNESIUM-ZINC-D3 PO) Take 1 tablet by mouth daily.    . Multiple Vitamins-Minerals (MULTIVITAMIN WITH MINERALS) tablet Take 1 tablet by mouth daily.     . Omega-3 Fatty Acids (FISH OIL) 1000 MG CAPS Take 1,000 mg by mouth daily.     Marland Kitchen  warfarin (COUMADIN) 2.5 MG tablet Take 2.5-5 mg by mouth See admin instructions. Take 5 mg by mouth daily on Monday, Wednesday and Friday. Take 2.5 mg by mouth daily on all other days.     No current facility-administered medications for this visit.       Physical Exam:   BP (!) 105/58 (BP Location: Right Arm, Patient Position: Sitting, Cuff Size: Large)   Pulse 74   Resp 16   Ht 5\' 5"  (1.651 m)   Wt 170 lb (77.1 kg)   LMP 05/07/2002 (Approximate)   SpO2 96% Comment: RA  BMI 28.29 kg/m   General:  Well-appearing  Chest:   Clear to auscultation  CV:   Irregular rate and rhythm  Incisions:  n/a  Abdomen:  Soft nontender  Extremities:  Warm and well-perfused  Diagnostic Tests:  CHEST - 2 VIEW  COMPARISON:  Chest CT 03/17/2018  FINDINGS: The cardiac silhouette, mediastinal and hilar contours are within normal limits. There is fairly marked left atrial enlargement which is stable. There is tortuosity and mild calcification of the thoracic aorta. The lungs are clear of an acute process. No pulmonary edema or pleural effusions. The bony thorax is intact. Mild pectus deformity.  IMPRESSION: Persistent left atrial enlargement.  No acute pulmonary findings.   Electronically Signed   By: Marijo Sanes M.D.   On: 03/25/2018 16:55   Impression:  Patient has rheumatic heart  disease with recurrent severe symptomatic mitral stenosis and mild mitral regurgitation status post balloon mitral valvuloplasty nearly 20 years ago with long-standing persistent/permanent atrial fibrillation.  She describes a slow gradual progression of symptoms of exertional shortness of breath and fatigue consistent with chronic diastolic congestive heart failure, New York Heart Association functional class IIb.  I have personally reviewed the patient's recent echocardiograms and diagnostic cardiac catheterization.  Echocardiograms demonstrate the presence of classical rheumatic mitral valve disease  with severe mitral stenosis and mild mitral regurgitation.  There is thickening with severely restricted leaflet mobility involving both leaflets of the mitral valve.  There is thickening and foreshortening of the subvalvular apparatus.  There is not a large amount of calcification.  There is severe left and right atrial enlargement.  Left ventricular systolic function appears normal.  The aortic valve is normal.  Right ventricle is mildly dilated.  There is mild tricuspid regurgitation.  The tricuspid annulus does not appear to be significantly dilated.  I agree the patient would benefit from elective mitral valve replacement.  Repeat balloon mitral valvuloplasty could be considered but would be associated with the potential for creating significant mitral regurgitation, unclear amount of symptomatic improvement, and likely poor long-term result.  Risks associated with mitral valve replacement should be relatively low.  Patient might benefit from concomitant Maze procedure, although the likelihood of restoration of sinus rhythm would be needed by the presence of severe biatrial enlargement and long-standing persistent atrial fibrillation.   Plan:  The patient and her husband were again counseled at length regarding the indications, risks and potential benefits of mitral valve replacement.  The rationale for elective surgery has been explained, including a comparison between surgery and continued medical therapy with close follow-up.  We discussed the possibility of replacing the mitral valve using a mechanical prosthesis with the attendant need for long-term anticoagulation versus the alternative of replacing it using a bioprosthetic tissue valve with its potential for late structural valve deterioration and failure, depending upon the patient's longevity.  The patient specifically requests that if the mitral valve must be replaced that it be done using a bioprosthetic tissue valve.   The relative risks and  benefits of performing a maze procedure at the time of their surgery was discussed at length, including the expected likelihood of long term freedom from recurrent symptomatic atrial fibrillation and/or atrial flutter.  Alternative surgical approaches have been discussed including a comparison between conventional sternotomy and minimally-invasive techniques.  The relative risks and benefits of each have been reviewed as they pertain to the patient's specific circumstances, and all of their questions have been addressed.  The patient understands and accepts all potential risks of surgery including but not limited to risk of death, stroke or other neurologic complication, myocardial infarction, congestive heart failure, respiratory failure, renal failure, bleeding requiring transfusion and/or reexploration, arrhythmia, infection or other wound complications, pneumonia, pleural and/or pericardial effusion, pulmonary embolus, aortic dissection or other major vascular complication, or delayed complications related to valve repair or replacement including but not limited to structural valve deterioration and failure, thrombosis, embolization, endocarditis, or paravalvular leak.  Specific risks potentially related to the minimally-invasive approach were discussed at length, including but not limited to risk of conversion to full or partial sternotomy, aortic dissection or other major vascular complication, unilateral acute lung injury or pulmonary edema, phrenic nerve dysfunction or paralysis, rib fracture, chronic pain, lung hernia, or lymphocele.  All of their questions have been addressed.   I spent in excess of  15 minutes during the conduct of this office consultation and >50% of this time involved direct face-to-face encounter with the patient for counseling and/or coordination of their care.    Valentina Gu. Roxy Manns, MD 03/25/2018 3:44 PM

## 2018-03-25 NOTE — Progress Notes (Signed)
Pre-op Cardiac Surgery  Carotid Findings:  Bilateral ICA's: 1-39% stenosis  Upper Extremity Right Left  Brachial Pressures 118 125  Radial Waveforms triphasic triphasic  Ulnar Waveforms triphasic triphasic  Palmar Arch (Allen's Test) Radial and  Ulnar: WNL Radial and Ulnar: WNL    Lorraine Kemp 03/25/2018 1:42 PM

## 2018-03-27 ENCOUNTER — Encounter (HOSPITAL_COMMUNITY): Payer: Self-pay | Admitting: Thoracic Surgery (Cardiothoracic Vascular Surgery)

## 2018-03-27 DIAGNOSIS — I5032 Chronic diastolic (congestive) heart failure: Secondary | ICD-10-CM | POA: Diagnosis present

## 2018-03-27 MED ORDER — TRANEXAMIC ACID (OHS) PUMP PRIME SOLUTION
2.0000 mg/kg | INTRAVENOUS | Status: DC
  Filled 2018-03-27: qty 1.54

## 2018-03-27 MED ORDER — POTASSIUM CHLORIDE 2 MEQ/ML IV SOLN
80.0000 meq | INTRAVENOUS | Status: DC
  Filled 2018-03-27: qty 40

## 2018-03-27 MED ORDER — SODIUM CHLORIDE 0.9 % IV SOLN
INTRAVENOUS | Status: DC
  Filled 2018-03-27: qty 30

## 2018-03-27 MED ORDER — VANCOMYCIN HCL 1000 MG IV SOLR
INTRAVENOUS | Status: AC
  Administered 2018-03-28: 1000 mL
  Filled 2018-03-27: qty 1000

## 2018-03-27 MED ORDER — TRANEXAMIC ACID (OHS) BOLUS VIA INFUSION
15.0000 mg/kg | INTRAVENOUS | Status: AC
  Administered 2018-03-28: 1156.5 mg via INTRAVENOUS
  Filled 2018-03-27: qty 1157

## 2018-03-27 MED ORDER — EPINEPHRINE PF 1 MG/ML IJ SOLN
0.0000 ug/min | INTRAVENOUS | Status: DC
  Filled 2018-03-27: qty 4

## 2018-03-27 MED ORDER — TRANEXAMIC ACID 1000 MG/10ML IV SOLN
1.5000 mg/kg/h | INTRAVENOUS | Status: AC
  Administered 2018-03-28: 1.5 mg/kg/h via INTRAVENOUS
  Filled 2018-03-27: qty 25

## 2018-03-27 MED ORDER — KENNESTONE BLOOD CARDIOPLEGIA VIAL
13.0000 mL | Status: DC
  Filled 2018-03-27: qty 1

## 2018-03-27 MED ORDER — GLUTARALDEHYDE 0.625% SOAKING SOLUTION
TOPICAL | Status: DC
  Filled 2018-03-27: qty 50

## 2018-03-27 MED ORDER — PHENYLEPHRINE HCL-NACL 20-0.9 MG/250ML-% IV SOLN
30.0000 ug/min | INTRAVENOUS | Status: DC
  Filled 2018-03-27: qty 250

## 2018-03-27 MED ORDER — SODIUM CHLORIDE 0.9 % IV SOLN
1.5000 g | INTRAVENOUS | Status: AC
  Administered 2018-03-28: 1.5 g via INTRAVENOUS
  Filled 2018-03-27: qty 1.5

## 2018-03-27 MED ORDER — KENNESTONE BLOOD CARDIOPLEGIA (KBC) MANNITOL SYRINGE (20%, 32ML)
32.0000 mL | INTRAVENOUS | Status: DC
  Filled 2018-03-27: qty 1

## 2018-03-27 MED ORDER — MILRINONE LACTATE IN DEXTROSE 20-5 MG/100ML-% IV SOLN
0.3000 ug/kg/min | INTRAVENOUS | Status: DC
  Filled 2018-03-27: qty 100

## 2018-03-27 MED ORDER — NITROGLYCERIN IN D5W 200-5 MCG/ML-% IV SOLN
2.0000 ug/min | INTRAVENOUS | Status: DC
  Filled 2018-03-27: qty 250

## 2018-03-27 MED ORDER — DEXMEDETOMIDINE HCL IN NACL 400 MCG/100ML IV SOLN
0.1000 ug/kg/h | INTRAVENOUS | Status: AC
  Administered 2018-03-28: 0.7 ug/kg/h via INTRAVENOUS
  Filled 2018-03-27: qty 100

## 2018-03-27 MED ORDER — INSULIN REGULAR(HUMAN) IN NACL 100-0.9 UT/100ML-% IV SOLN
INTRAVENOUS | Status: AC
  Administered 2018-03-28: .7 [IU]/h via INTRAVENOUS
  Filled 2018-03-27: qty 100

## 2018-03-27 MED ORDER — VANCOMYCIN HCL 10 G IV SOLR
1250.0000 mg | INTRAVENOUS | Status: AC
  Administered 2018-03-28: 1250 mg via INTRAVENOUS
  Filled 2018-03-27: qty 1250

## 2018-03-27 MED ORDER — SODIUM CHLORIDE 0.9 % IV SOLN
750.0000 mg | INTRAVENOUS | Status: AC
  Administered 2018-03-28: 750 mg via INTRAVENOUS
  Filled 2018-03-27: qty 750

## 2018-03-27 MED ORDER — PLASMA-LYTE 148 IV SOLN
INTRAVENOUS | Status: DC
  Filled 2018-03-27: qty 2.5

## 2018-03-27 MED ORDER — DOPAMINE-DEXTROSE 3.2-5 MG/ML-% IV SOLN
0.0000 ug/kg/min | INTRAVENOUS | Status: DC
  Filled 2018-03-27: qty 250

## 2018-03-27 MED ORDER — MAGNESIUM SULFATE 50 % IJ SOLN
40.0000 meq | INTRAMUSCULAR | Status: DC
  Filled 2018-03-27: qty 9.85

## 2018-03-27 NOTE — Anesthesia Preprocedure Evaluation (Addendum)
Anesthesia Evaluation  Patient identified by MRN, date of birth, ID band Patient awake    Reviewed: Allergy & Precautions, H&P , NPO status , Patient's Chart, lab work & pertinent test results  History of Anesthesia Complications (+) PONV  Airway Mallampati: II  TM Distance: >3 FB Neck ROM: Full    Dental no notable dental hx. (+) Teeth Intact, Dental Advisory Given   Pulmonary neg pulmonary ROS,    Pulmonary exam normal breath sounds clear to auscultation       Cardiovascular Exercise Tolerance: Good +CHF  + dysrhythmias Atrial Fibrillation + Valvular Problems/Murmurs MR  Rhythm:Regular Rate:Normal     Neuro/Psych Depression negative neurological ROS     GI/Hepatic negative GI ROS, Neg liver ROS,   Endo/Other  Hypothyroidism   Renal/GU negative Renal ROS  negative genitourinary   Musculoskeletal   Abdominal   Peds  Hematology negative hematology ROS (+)   Anesthesia Other Findings   Reproductive/Obstetrics negative OB ROS                            Anesthesia Physical Anesthesia Plan  ASA: IV  Anesthesia Plan: General   Post-op Pain Management:    Induction: Intravenous  PONV Risk Score and Plan: 4 or greater and Midazolam and Treatment may vary due to age or medical condition  Airway Management Planned: Double Lumen EBT  Additional Equipment: Arterial line, CVP, PA Cath, TEE, 3D TEE and Ultrasound Guidance Line Placement  Intra-op Plan:   Post-operative Plan: Post-operative intubation/ventilation  Informed Consent: I have reviewed the patients History and Physical, chart, labs and discussed the procedure including the risks, benefits and alternatives for the proposed anesthesia with the patient or authorized representative who has indicated his/her understanding and acceptance.   Dental advisory given  Plan Discussed with: CRNA  Anesthesia Plan Comments:          Anesthesia Quick Evaluation

## 2018-03-27 NOTE — H&P (Signed)
[address]       [place] [address]             [phone]          CARDIOTHORACIC SURGERY HISTORY AND PHYSICAL EXAM  Referring Provider is Stanford Breed, Denice Bors, MD PCP is Chesley Noon, MD      Chief Complaint  Patient presents with  . Mitral Stenosis    new patient consultation, CATH 03/04/18, ECHO 12/12/2017    HPI:  [name] with history of rheumatic heart disease who has been referred for surgical consultation to discuss treatment options for management of severe symptomatic mitral stenosis.  Patient's cardiac history dates back more than 20 years ago when she first presented with symptoms of congestive heart failure and atrial fibrillation.  She was diagnosed with rheumatic mitral stenosis and underwent balloon valvuloplasty at [facility] in 2000.  Shortly after that she underwent DC cardioversion, but she quickly went back into atrial fibrillation.  She has been chronically anticoagulated using warfarin ever since.  She did well and has remained clinically stable until recently.  She has been followed for many years by Dr. Stanford Breed with serial echocardiograms.  Over the past several months to a year the patient has developed gradual progression of symptoms of exertional shortness of breath and fatigue.  Recent follow-up transthoracic and transesophageal echocardiograms documented the presence of recurrent severe mitral stenosis with mild to moderate mitral regurgitation, mild right ventricular chamber enlargement, and mild to moderate tricuspid regurgitation.  She was sent back to Dr. Mina Marble at [facility] to consider repeat balloon valvuloplasty.  However, after consultation she has now been referred for elective surgical intervention.  The patient is married and lives locally in [place] with her husband.  She has been retired for approximately 4 years having previously worked for [employer] and previously for [employer].  She has remained physically active and entirely functionally independent, although she admits that she does not exercise on a regular basis.  She states that she and her husband used to walk regularly, but over the past year so she has had to give this up because of worsening symptoms of exertional shortness of breath and fatigue.  She now gets short of breath and tired with moderate level activity.  She denies any resting shortness of breath, PND, orthopnea, or lower extremity edema.  She does not have any chest pain or chest tightness either with activity or at rest.  She reports occasional palpitations.  She has been chronically anticoagulated using warfarin for nearly 20 years.  She has not had any bleeding complications nor any history of TIA or stroke.  Patient returns the office today with tentative plans to proceed with minimally invasive mitral valve replacement and Maze procedure later this week.  She was originally seen in consultation on March 07, 2018.  She reports no new problems or complaints.  She describes stable symptoms of exertional shortness of breath and fatigue consistent with symptoms of chronic diastolic congestive heart failure, New York Heart Association functional class II.  She denies any recent fever, chills, or productive cough.  Appetite is normal.  Remainder of her review of systems is unchanged from previously.  She is looking forward to proceeding with definitive surgical intervention.  She stopped taking Coumadin last week and is currently on Lovenox injections twice daily for bridging therapy.  Past Medical History:  Diagnosis Date  .  Atrial fibrillation ([place]) 08/26/2008  . Complication of anesthesia   . Depression   . Hematuria    workup by Dr. Diona Fanti  . MITRAL REGURGITATION 08/26/2008  . Mitral stenosis 08/26/2008  . PONV (postoperative nausea and vomiting)   . RHEUMATIC HEART DISEASE 08/26/2008  .  Thyroid nodule   . Wrist fracture, left 2019   Dr. Sharol Given    Past Surgical History:  Procedure Laterality Date  . ABDOMINAL AORTOGRAM N/A 03/04/2018   Procedure: ABDOMINAL AORTOGRAM;  Surgeon: Jettie Booze, MD;  Location: [facility];  Service: Cardiovascular;  Laterality: N/A;  . CARDIOVERSION     2000  . CESAREAN SECTION    . COLONOSCOPY    . PERCUTANEOUS BALLOON VALVULOPLASTY  10/1998   [place]  . RIGHT/LEFT HEART CATH AND CORONARY ANGIOGRAPHY N/A 03/04/2018   Procedure: RIGHT/LEFT HEART CATH AND CORONARY ANGIOGRAPHY;  Surgeon: Jettie Booze, MD;  Location: [facility];  Service: Cardiovascular;  Laterality: N/A;  . screw in ankles  1989   right  . TEE WITHOUT CARDIOVERSION N/A 12/31/2017   Procedure: TRANSESOPHAGEAL ECHOCARDIOGRAM (TEE);  Surgeon: Lelon Perla, MD;  Location: [facility];  Service: Cardiovascular;  Laterality: N/A;  . THYROIDECTOMY  05/30/2012   Procedure: THYROIDECTOMY;  Surgeon: Earnstine Regal, MD;  Location: [facility];  Service: General;  Laterality: N/A;  total thyroidectomy  . TONSILLECTOMY  1956  . TUBAL LIGATION      Family History  Problem Relation Age of Onset  . Lung cancer Father   . Cancer Father        lung  . Heart disease Mother        S/P CABG  . Cancer Brother        brain  . Hypertension Sister   . Heart disease Maternal Grandmother     Social History Social History   Tobacco Use  . Smoking status: Never Smoker  . Smokeless tobacco: Never Used  Substance Use Topics  . Alcohol use: Yes    Alcohol/week: 1.0 standard drinks    Types: 1 Glasses of wine per week    Comment: occas glass of wine weekly  . Drug use: No    Prior to Admission medications   Medication Sig Start Date End Date Taking? Authorizing Provider  amoxicillin (AMOXIL) 500 MG capsule Take 2,000 mg by mouth See admin instructions. Take 2000 mg by mouth 1 hour prior to dental appointment 07/31/17  Yes [provider]    buPROPion (WELLBUTRIN XL) 300 MG 24 hr tablet TAKE 1 TABLET DAILY Patient taking differently: Take 300 mg by mouth daily.  07/20/15  Yes Kem Boroughs, FNP  diltiazem (CARDIZEM CD) 240 MG 24 hr capsule TAKE 1 CAPSULE (240 MG TOTAL) BY MOUTH DAILY. 07/02/17  Yes Lelon Perla, MD  levothyroxine (SYNTHROID, LEVOTHROID) 112 MCG tablet Take 112 mcg by mouth daily before breakfast.  11/26/17  Yes [provider]  Multiple Minerals-Vitamins (CALCIUM-MAGNESIUM-ZINC-D3 PO) Take 1 tablet by mouth daily.   Yes [provider]  Multiple Vitamins-Minerals (MULTIVITAMIN WITH MINERALS) tablet Take 1 tablet by mouth daily.    Yes [provider]  Omega-3 Fatty Acids (FISH OIL) 1000 MG CAPS Take 1,000 mg by mouth daily.    Yes [provider]  warfarin (COUMADIN) 2.5 MG tablet Take 2.5-5 mg by mouth See admin instructions. Take 5 mg by mouth daily on Monday, Wednesday and Friday. Take 2.5 mg by mouth daily on all other days.  Yes [provider]  Enoxaparin Sodium (LOVENOX [place]) Inject 1 Syringe into the skin 2 (two) times daily. 03/22/18 03/30/18  [provider]    No Known Allergies    Review of Systems:              General:                      normal appetite, decreased energy, no weight gain, no weight loss, no fever             Cardiac:                       no chest pain with exertion, no chest pain at rest, +SOB with exertion, no resting SOB, no PND, no orthopnea, + palpitations, + arrhythmia, + atrial fibrillation, no LE edema, no dizzy spells, no syncope             Respiratory:                 + exertional shortness of breath, no home oxygen, no productive cough, no dry cough, no bronchitis, no wheezing, no hemoptysis, no asthma, no pain with inspiration or cough, no sleep apnea, no CPAP at night             GI:                               no difficulty swallowing, no reflux, no frequent heartburn, no hiatal hernia, no abdominal pain, no  constipation, no diarrhea, no hematochezia, no hematemesis, no melena             GU:                              no dysuria,  no frequency, no urinary tract infection, no hematuria, no kidney stones, no kidney disease             Vascular:                     no pain suggestive of claudication, no pain in feet, no leg cramps, no varicose veins, no DVT, no non-healing foot ulcer             Neuro:                         no stroke, no TIA's, no seizures, no headaches, no temporary blindness one eye,  no slurred speech, no peripheral neuropathy, no chronic pain, no instability of gait, no memory/cognitive dysfunction             Musculoskeletal:         + mild arthritis in hands, no joint swelling, no myalgias, no difficulty walking, normal mobility              Skin:                            no rash, no itching, no skin infections, no pressure sores or ulcerations             Psych:                         no anxiety, no depression, no nervousness, no unusual  recent stress             Eyes:                           no blurry vision, + floaters, no recent vision changes, does not wears glasses or contacts             ENT:                            no hearing loss, no loose or painful teeth, no dentures, last saw dentist within the past 3 months             Hematologic:               no easy bruising, no abnormal bleeding, no clotting disorder, no frequent epistaxis             Endocrine:                   no diabetes, does not check CBG's at home                                                       Physical Exam:              BP 130/80 (BP Location: Left Arm, Patient Position: Sitting, Cuff Size: Normal)   Pulse 96   Resp 18   Ht 5\' 5"  (1.651 m)   Wt 168 lb (76.2 kg)   LMP 05/07/2002 (Approximate)   SpO2 97% Comment: RA  BMI 27.96 kg/m              General:                      Mildly obese,  well-appearing             HEENT:                       Unremarkable              Neck:                            no JVD, no bruits, no adenopathy              Chest:                          clear to auscultation, symmetrical breath sounds, no wheezes, no rhonchi              CV:                              Irregular rate and rhythm, grade II/VI systolic murmur heard best at LLSB,  no diastolic murmur             Abdomen:                    soft, non-tender, no masses              Extremities:  warm, well-perfused, pulses diminished but palpable, no LE edema             Rectal/GU                   Deferred             Neuro:                         Grossly non-focal and symmetrical throughout             Skin:                            Clean and dry, no rashes, no breakdown   Diagnostic Tests:  Transthoracic Echocardiography  Patient: Lasaundra, Riche MR #: [id number] Study Date: 12/12/2017 Gender: F Age: 58 Height: 165.1 cm Weight: 76.9 kg BSA: 1.9 m^2 Pt. Status: Room:  [place] [facility] SONOGRAPHER Wyatt Mage, [place], Outpatient ATTENDING Skeet Latch, MD  cc:  ------------------------------------------------------------------- LV EF: 55% - 60%  ------------------------------------------------------------------- Indications: Mitral stenosis (I05.0).  ------------------------------------------------------------------- History: PMH: Dyspnea. Atrial fibrillation. Mitral valve disease.  ------------------------------------------------------------------- Study Conclusions  - Left ventricle: The cavity size was normal. Systolic function was normal. The estimated ejection fraction was in the range of 55% to 60%. Wall motion was normal; there were no regional wall motion abnormalities. The study is not technically sufficient to allow evaluation of LV diastolic function. Doppler parameters are consistent with  high ventricular filling pressure. - Aortic valve: Transvalvular velocity was within the normal range. There was no stenosis. There was no regurgitation. - Mitral valve: Mobility was restricted. The findings are consistent with severe stenosis. There was mild regurgitation. Pressure half-time: 189 ms. Mean gradient (D): 11 mm Hg. Valve area by pressure half-time: 1.11 cm^2. Valve area by continuity equation (using LVOT flow): 0.75 cm^2. - Left atrium: The atrium was severely dilated. - Right ventricle: The cavity size was normal. Wall thickness was normal. Systolic function was normal. - Tricuspid valve: There was mild regurgitation. - Pulmonary arteries: Systolic pressure was mildly increased. PA peak pressure: 43 mm Hg (S).  Impressions:  - Compared with the echo 07/31/16, the mitral stenosis severity has worsened and is now severe.  ------------------------------------------------------------------- Labs, prior tests, procedures, and surgery: Transthoracic echocardiography (07/31/2016). The mitral valve showed moderate to severe stenosis and mild regurgitation. EF was 55% and PA pressure was 29 (systolic). Mitral valve: mean gradient of 8 mm Hg.  ------------------------------------------------------------------- Study data: Comparison was made to the study of 07/31/2016. Study status: Routine. Procedure: The patient reported no pain pre or post test. Transthoracic echocardiography. Image quality was adequate. Study completion: There were no complications. Transthoracic echocardiography. M-mode, complete 2D, spectral Doppler, and color Doppler. Birthdate: Patient birthdate: [birth year]. Age: Patient is 67 yr old. Sex: Gender: female. BMI: 28.2 kg/m^2. Blood pressure: 122/74 Patient status: Outpatient. Study date: Study date: 12/12/2017. Study time: 03:52 PM. Location: [facility]  3  -------------------------------------------------------------------  ------------------------------------------------------------------- Left ventricle: The cavity size was normal. Systolic function was normal. The estimated ejection fraction was in the range of 55% to 60%. Wall motion was normal; there were no regional wall motion abnormalities. The study is not technically sufficient to allow evaluation of LV diastolic function. Doppler parameters are consistent with high ventricular filling pressure.  ------------------------------------------------------------------- Aortic valve: Trileaflet; normal thickness leaflets. Mobility was not restricted. Doppler: Transvalvular velocity was within the normal range.  There was no stenosis. There was no regurgitation.  ------------------------------------------------------------------- Aorta: Aortic root: The aortic root was normal in size.  ------------------------------------------------------------------- Mitral valve: Severely thickened leaflets anterior greater than posterior. Hockey stick movement of the anterior mitral valve leaflet consistent with Rheumatic heart disease. Mobility was restricted. Doppler: The findings are consistent with severe stenosis. There was mild regurgitation. Valve area by pressure half-time: 1.11 cm^2. Indexed valve area by pressure half-time: 0.58 cm^2/m^2. Valve area by continuity equation (using LVOT flow): 0.75 cm^2. Indexed valve area by continuity equation (using LVOT flow): 0.4 cm^2/m^2. Mean gradient (D): 11 mm Hg. Peak gradient (D): 16 mm Hg.  ------------------------------------------------------------------- Left atrium: The atrium was severely dilated.  ------------------------------------------------------------------- Right ventricle: The cavity size was normal. Wall thickness was normal. Systolic function was  normal.  ------------------------------------------------------------------- Pulmonic valve: Structurally normal valve. Cusp separation was normal. Doppler: Transvalvular velocity was within the normal range. There was no evidence for stenosis. There was mild regurgitation.  ------------------------------------------------------------------- Tricuspid valve: Structurally normal valve. Doppler: Transvalvular velocity was within the normal range. There was mild regurgitation.  ------------------------------------------------------------------- Pulmonary artery: The main pulmonary artery was normal-sized. Systolic pressure was mildly increased.  ------------------------------------------------------------------- Right atrium: The atrium was normal in size.  ------------------------------------------------------------------- Pericardium: There was no pericardial effusion.  ------------------------------------------------------------------- Systemic veins: Inferior vena cava: The vessel was normal in size. The respirophasic diameter changes were blunted (< 50%), consistent with normal central venous pressure.  ------------------------------------------------------------------- Measurements  Left ventricle Value Reference LV ID, ED, PLAX chordal 46 mm 43 - 52 LV ID, ES, PLAX chordal 32 mm 23 - 38 LV fx shortening, PLAX chordal 30 % >=29 LV PW thickness, ED 10 mm --------- IVS/LV PW ratio, ED 0.9 <=1.3 Stroke volume, 2D 52 ml --------- Stroke volume/bsa, 2D 27 ml/m^2 --------- LV e&', lateral 7.42 cm/s --------- LV E/e&', lateral 26.68 --------- LV e&',  medial 4.88 cm/s --------- LV E/e&', medial 40.57 --------- LV e&', average 6.15 cm/s --------- LV E/e&', average 32.2 ---------  Ventricular septum Value Reference IVS thickness, ED 9 mm ---------  LVOT Value Reference LVOT ID, S 20 mm --------- LVOT area 3.14 cm^2 --------- LVOT peak velocity, S 84.2 cm/s --------- LVOT mean velocity, S 54.2 cm/s --------- LVOT VTI, S 16.7 cm ---------  Aorta Value Reference Aortic root ID, ED 30 mm ---------  Left atrium Value Reference LA ID, A-P, ES 61 mm --------- LA ID/bsa, A-P (H) 3.21 cm/m^2 <=2.2 LA volume, ES, 1-p A4C 53 ml --------- LA volume/bsa, ES, 1-p A4C 27.9 ml/m^2 --------- LA volume, ES, 1-p A2C 113 ml --------- LA volume/bsa, ES, 1-p A2C 59.5 ml/m^2 ---------  Mitral valve Value Reference Mitral E-wave peak velocity 198 cm/s --------- Mitral A-wave peak velocity 2.06 cm/s --------- Mitral mean velocity, D 157 cm/s --------- Mitral deceleration time (H) 687 ms 150 - 230 Mitral pressure half-time 189 ms --------- Mitral mean gradient, D 11 mm Hg --------- Mitral  peak gradient, D 16 mm Hg --------- Mitral E/A ratio, peak 94.2 --------- Mitral valve area, PHT, DP 1.11 cm^2 --------- Mitral valve area/bsa, PHT, DP 0.58 cm^2/m^2 --------- Mitral valve area, LVOT 0.75 cm^2 --------- continuity Mitral valve area/bsa, LVOT 0.4 cm^2/m^2 --------- continuity Mitral annulus VTI, D 69.5 cm --------- Mitral regurg VTI, PISA 135 cm ---------  Pulmonary arteries Value Reference PA pressure, S, DP (H) 43 mm Hg <=30  Tricuspid valve Value Reference Tricuspid regurg peak velocity 295 cm/s --------- Tricuspid peak RV-RA gradient 35 mm Hg ---------  Systemic veins Value Reference Estimated CVP 8 mm Hg ---------  Right ventricle Value Reference TAPSE 22.8 mm --------- RV pressure, S, DP (H) 43 mm Hg <=30 RV s&', lateral, S 12 cm/s ---------  Pulmonic valve Value Reference Pulmonic regurg velocity, ED 150 cm/s --------- Pulmonic regurg gradient, ED 9 mm Hg ---------  Legend: (L) and (H) mark values outside specified reference range.  ------------------------------------------------------------------- Prepared and Electronically Authenticated by  Skeet Latch, MD 2019-08-08T19:27:41   Transesophageal Echocardiography  Patient: Neve, Branscomb MR #: [id number] Study Date: 12/31/2017 Gender: F Age: 87 Height: 165.1  cm Weight: 76.2 kg BSA: 1.89 m^2 Pt. Status: Room:  [place] [facility] SONOGRAPHER Haroldine Laws  cc:  ------------------------------------------------------------------- LV EF: 55% - 60%  ------------------------------------------------------------------- Indications: Mitral stenosis [non-rheumatic] 424.0. Mitral regurgitation 424.0.  ------------------------------------------------------------------- History: PMH: Mitral regurgitation. Rheumatic heart disease. Dyspnea. Atrial fibrillation. Mitral stenosis.  ------------------------------------------------------------------- Study Conclusions  - Left ventricle: Systolic function was normal. The estimated ejection fraction was in the range of 55% to 60%. Wall motion was normal; there were no regional wall motion abnormalities. - Aortic valve: No evidence of vegetation. No evidence of vegetation. - Mitral valve: Moderate thickening, consistent with rheumatic disease. The findings are consistent with severe stenosis. There was mild to moderate regurgitation. Valve area by pressure half-time: 0.96 cm^2. - Left atrium: The atrium was severely dilated. There was mild spontaneous echo contrast (&quot;smoke&quot;) in the cavity and the appendage. - Right ventricle: The cavity size was mildly dilated. - Right atrium: The atrium was moderately dilated. - Atrial septum: No defect or patent foramen ovale was identified. - Tricuspid valve: No evidence of vegetation. There was mild-moderate regurgitation. - Pulmonic valve: No evidence of vegetation.  Impressions:  - Normal LV systolic function; severe LAE; moderate RAE; mild RVE; rheumatic MV with severe MS (mean gradient 6 mmHg; MVA 1 cm2 by pressure half time; MVA by planimetry 0.95 cm2); mild to  moderate MR; mild to moderate TR.  ------------------------------------------------------------------- Study data: Study status: Routine. Consent: The risks, benefits, and alternatives to the procedure were explained to the patient and informed consent was obtained. Procedure: The patient reported no pain pre or post test. Initial setup. The patient was brought to the laboratory. Surface ECG leads were monitored. Sedation. Conscious sedation was administered by cardiology staff. Transesophageal echocardiography. An adult multiplane transesophageal probe was inserted by the attending cardiologistwithout difficulty. Image quality was adequate. Study completion: The patient tolerated the procedure well. There were no complications. Administered medications: Midazolam, 5mg , IV. Fentanyl, 22mcg, IV. Diagnostic transesophageal echocardiography. 2D and color Doppler. Birthdate: Patient birthdate: 1950-08-03. Age: Patient is 67 yr old. Sex: Gender: female. BMI: 28 kg/m^2. Blood pressure: 100/46 Patient status: Inpatient. Study date: Study date: 12/31/2017. Study time: 08:48 AM. Location: Endoscopy.  -------------------------------------------------------------------  ------------------------------------------------------------------- Left ventricle: Systolic function was normal. The estimated ejection fraction was in the range of 55% to 60%. Wall motion was normal; there were no regional wall motion abnormalities.  ------------------------------------------------------------------- Aortic valve: Structurally normal valve. Trileaflet. Cusp separation was normal. No evidence of vegetation. No evidence of vegetation. Doppler: There was no regurgitation.  ------------------------------------------------------------------- Aorta: Descending aorta: The descending aorta had moderate  diffuse disease.  ------------------------------------------------------------------- Mitral valve: Moderate thickening, consistent with rheumatic disease. Doppler: The findings are consistent with severe stenosis. There was mild to moderate regurgitation. Valve area by pressure half-time: 0.96 cm^2. Indexed valve area by pressure half-time: 0.51 cm^2/m^2. Mean gradient (D): 6 mm Hg.  ------------------------------------------------------------------- Left atrium: The atrium was severely dilated. There was  mild spontaneous echo contrast (&quot;smoke&quot;) in the cavity and the appendage.  ------------------------------------------------------------------- Atrial septum: No defect or patent foramen ovale was identified.  ------------------------------------------------------------------- Right ventricle: The cavity size was mildly dilated. Systolic function was normal.  ------------------------------------------------------------------- Pulmonic valve: Structurally normal valve. Cusp separation was normal. No evidence of vegetation. Doppler: There was mild regurgitation.  ------------------------------------------------------------------- Tricuspid valve: Structurally normal valve. Leaflet separation was normal. No evidence of vegetation. Doppler: There was mild-moderate regurgitation.  ------------------------------------------------------------------- Right atrium: The atrium was moderately dilated.  ------------------------------------------------------------------- Pericardium: There was no pericardial effusion.  ------------------------------------------------------------------- Measurements  Mitral valve Value Mitral mean velocity, D 114 cm/s Mitral pressure half-time 237 ms Mitral mean gradient, D 6 mm Hg Mitral valve area, PHT, DP 0.96  cm^2 Mitral valve area/bsa, PHT, DP 0.51 cm^2/m^2 Mitral annulus VTI, D 56.8 cm  Legend: (L) and (H) mark values outside specified reference range.  ------------------------------------------------------------------- Prepared and Electronically Authenticated by  Kirk Ruths 2019-08-27T15:28:25   ABDOMINAL AORTOGRAM  RIGHT/LEFT HEART CATH AND CORONARY ANGIOGRAPHY  Conclusion     Mid LAD lesion is 25% stenosed.  Prox LAD lesion is 25% stenosed.  LV end diastolic pressure is normal.  There is no aortic valve stenosis.  Nonobstructive coronary artery disease.  CO 5.37 L/min; CI 2.94; Ao sat 98%, PA sat 73%; PA pressure 33/15, mean PA 22 mm Hg; mean PCWP 16 mm Hg  No significant aortoiliac disease noted on abdominal aortogram.  Mitral valve area calculated: 1.76 cm2  Continue with plan for mitral valve surgery.   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 The patient verbalized understanding and wanted to proceed. Informed written consent was obtained.  PROCEDURE TECHNIQUE: After Xylocaine anesthesia, a 5 French sheath was placed in the right antecubital area in exchange for a peripheral IV. A 5 French balloontipped Swan-Ganz catheter was advanced to the pulmonary artery under fluoroscopic guidance. Hemodynamic pressures were obtained. Oxygen saturations were obtained. After Xylocaine anesthesia, a 39F sheath was placed in the right radial artery with a single anterior needle wall stick using ultrasound guidance. An image was captured and stored. Left heart cath and Right coronary angiography was done using a Judkins R4 guide catheter. Left coronary angiography was done using a Judkins L3.5 guide catheter. Abdominal aortography was done using a pigtail catheter.     Contrast: 90  cc   Estimated blood loss <50 mL.  During this procedure the patient was administered the following to achieve and maintain moderate conscious sedation: Versed 2 mg, Fentanyl 25 mcg, while the patient's heart rate, blood pressure, and oxygen saturation were continuously monitored. The period of conscious sedation was 36 minutes, of which I was present face-to-face 100% of this time.  Complications   Complications documented before study signed (03/04/2018 8:33 AM EDT)    No complications were associated with this study.  Documented by Jettie Booze, MD - 03/04/2018 8:31 AM EDT    Coronary Findings   Diagnostic  Dominance: Right  Left Anterior Descending  Prox LAD lesion 25% stenosed  Prox LAD lesion is 25% stenosed.  Mid LAD lesion 25% stenosed  Mid LAD lesion is 25% stenosed.  Intervention   No interventions have been documented.  Right Heart   Right Heart Pressures CO 5.37 L/min; CI 2.94; Ao sat 98%, PA sat 73%; PA pressure 33/15, mean PA 22 mm Hg; mean PCWP 16 mm Hg  Left  Heart   Left Ventricle The left ventricular size is normal. LV end diastolic pressure is normal.  Aortic Valve There is no aortic valve stenosis.  Coronary Diagrams   Diagnostic Diagram       Implants       No implant documentation for this case.  MERGE Images   Show images for CARDIAC CATHETERIZATION   Link to Procedure Log   Procedure Log    Hemo Data    Most Recent Value  Fick Cardiac Output 5.37 L/min  Fick Cardiac Output Index 2.94 (L/min)/BSA  Mitral Mean Gradient 8.61 mmHg  Mitral Peak Gradient 9 mmHg  Mitral Valve Area Index 0.96 cm2/BSA  RA A Wave 6 mmHg  RA V Wave 8 mmHg  RA Mean 6 mmHg  RV Systolic Pressure 35 mmHg  RV Diastolic Pressure 2 mmHg  RV EDP 6 mmHg  PA Systolic Pressure 33 mmHg  PA Diastolic Pressure 15 mmHg  PA Mean 22 mmHg  PW A Wave 18 mmHg  PW V Wave 26 mmHg  PW Mean 19 mmHg  AO Systolic Pressure 478 mmHg  AO Diastolic Pressure  64 mmHg  AO Mean 87 mmHg  LV Systolic Pressure 295 mmHg  LV Diastolic Pressure 2 mmHg  LV EDP 10 mmHg  AOp Systolic Pressure 621 mmHg  AOp Diastolic Pressure 68 mmHg  AOp Mean Pressure 90 mmHg  LVp Systolic Pressure 308 mmHg  LVp Diastolic Pressure 9 mmHg  LVp EDP Pressure 12 mmHg  QP/QS 1  TPVR Index 7.47 HRUI  TSVR Index 29.56 HRUI  PVR SVR Ratio 0.07  TPVR/TSVR Ratio 0.25     Impression:  Patient has rheumatic heart disease with recurrent severe symptomatic mitral stenosis and mild mitral regurgitation status post balloon mitral valvuloplasty nearly 20 years ago with long-standing persistent/permanent atrial fibrillation. She describes a slow gradual progression of symptoms of exertional shortness of breath and fatigue consistent with chronic diastolic congestive heart failure, [place] Heart Association functional class IIb. I have personally reviewed the patient's recent echocardiograms and diagnostic cardiac catheterization. Echocardiograms demonstrate the presence of classical rheumatic mitral valve disease with severe mitral stenosis and mild mitral regurgitation. There is thickening with severely restricted leaflet mobility involving both leaflets of the mitral valve. There is thickening and foreshortening of the subvalvular apparatus. There is not a large amount of calcification. There is severe left and right atrial enlargement. Left ventricular systolic function appears normal. The aortic valve is normal. Right ventricle is mildly dilated. There is mild tricuspid regurgitation. The tricuspid annulus does not appear to be significantly dilated.  I agree the patient would benefit from elective mitral valve replacement. Repeat balloon mitral valvuloplasty could be considered but would be associated with the potential for creating significant mitral regurgitation, unclear amount of symptomatic improvement, and likely poor long-term result. Risks associated with  mitral valve replacement should be relatively low. Patient might benefit from concomitant Maze procedure, although the likelihood of restoration of sinus rhythm would be needed by the presence of severe biatrial enlargement and long-standing persistent atrial fibrillation.   Plan:  The patientand her husband were againcounseled at length regarding the indications, risks and potential benefits of mitral valve replacement. The rationale for elective surgery has been explained, including a comparison between surgery and continued medical therapy with close follow-up. We discussed the possibility of replacing the mitral valve using a mechanical prosthesis with the attendant need for long-term anticoagulation versus the alternative of replacing it using a bioprosthetic tissue valve with its potential  for late structural valve deterioration and failure, depending upon the patient's longevity. The patient specifically requests that if the mitral valve must be replaced that it be done using a bioprosthetic tissuevalve. The relative risks and benefits of performing a maze procedure at the time of their surgery was discussed at length, including the expected likelihood of long term freedom from recurrent symptomatic atrial fibrillation and/or atrial flutter.Alternative surgical approaches have been discussed including a comparison between conventional sternotomy and minimally-invasive techniques. The relative risks and benefits of each have been reviewed as they pertain to the patient's specific circumstances, and all of their questions have been addressed.  The patient understands and accepts all potential risks of surgery including but not limited to risk of death, stroke or other neurologic complication, myocardial infarction, congestive heart failure, respiratory failure, renal failure, bleeding requiring transfusion and/or reexploration, arrhythmia, infection or other wound complications, pneumonia,  pleural and/or pericardial effusion, pulmonary embolus, aortic dissection or other major vascular complication, or delayed complications related to valve repair or replacement including but not limited to structural valve deterioration and failure, thrombosis, embolization, endocarditis, or paravalvular leak.  Specific risks potentially related to the minimally-invasive approach were discussed at length, including but not limited to risk of conversion to full or partial sternotomy, aortic dissection or other major vascular complication, unilateral acute lung injury or pulmonary edema, phrenic nerve dysfunction or paralysis, rib fracture, chronic pain, lung hernia, or lymphocele.  All of their questions have been addressed.     Valentina Gu. Roxy Manns, MD 03/25/2018 3:44 PM

## 2018-03-28 ENCOUNTER — Other Ambulatory Visit: Payer: Self-pay

## 2018-03-28 ENCOUNTER — Inpatient Hospital Stay (HOSPITAL_COMMUNITY)
Admission: RE | Admit: 2018-03-28 | Discharge: 2018-04-02 | DRG: 219 | Disposition: A | Discharge status: Home Health | Payer: Medicare HMO | Attending: Thoracic Surgery (Cardiothoracic Vascular Surgery) | Admitting: Thoracic Surgery (Cardiothoracic Vascular Surgery)

## 2018-03-28 ENCOUNTER — Inpatient Hospital Stay (HOSPITAL_COMMUNITY): Payer: Medicare HMO | Admitting: Certified Registered Nurse Anesthetist

## 2018-03-28 ENCOUNTER — Inpatient Hospital Stay (HOSPITAL_COMMUNITY): Payer: Medicare HMO

## 2018-03-28 ENCOUNTER — Encounter (HOSPITAL_COMMUNITY): Payer: Self-pay

## 2018-03-28 ENCOUNTER — Encounter (HOSPITAL_COMMUNITY)
Admission: RE | Disposition: A | Discharge status: Home Health | Payer: Self-pay | Source: Home / Self Care | Attending: Thoracic Surgery (Cardiothoracic Vascular Surgery)

## 2018-03-28 ENCOUNTER — Inpatient Hospital Stay (HOSPITAL_COMMUNITY)
Admission: RE | Admit: 2018-03-28 | Discharge: 2018-03-28 | Disposition: A | Discharge status: Home / Self Care | Payer: Medicare HMO | Source: Ambulatory Visit | Attending: Thoracic Surgery (Cardiothoracic Vascular Surgery) | Admitting: Thoracic Surgery (Cardiothoracic Vascular Surgery)

## 2018-03-28 ENCOUNTER — Inpatient Hospital Stay (HOSPITAL_COMMUNITY): Payer: Medicare HMO | Admitting: Physician Assistant

## 2018-03-28 DIAGNOSIS — D62 Acute posthemorrhagic anemia: Secondary | ICD-10-CM | POA: Diagnosis not present

## 2018-03-28 DIAGNOSIS — J9811 Atelectasis: Secondary | ICD-10-CM

## 2018-03-28 DIAGNOSIS — I5033 Acute on chronic diastolic (congestive) heart failure: Secondary | ICD-10-CM | POA: Diagnosis present

## 2018-03-28 DIAGNOSIS — Z79899 Other long term (current) drug therapy: Secondary | ICD-10-CM | POA: Diagnosis not present

## 2018-03-28 DIAGNOSIS — Z8679 Personal history of other diseases of the circulatory system: Secondary | ICD-10-CM

## 2018-03-28 DIAGNOSIS — F329 Major depressive disorder, single episode, unspecified: Secondary | ICD-10-CM | POA: Diagnosis present

## 2018-03-28 DIAGNOSIS — I5032 Chronic diastolic (congestive) heart failure: Secondary | ICD-10-CM | POA: Diagnosis present

## 2018-03-28 DIAGNOSIS — I050 Rheumatic mitral stenosis: Secondary | ICD-10-CM | POA: Diagnosis present

## 2018-03-28 DIAGNOSIS — I081 Rheumatic disorders of both mitral and tricuspid valves: Secondary | ICD-10-CM | POA: Diagnosis present

## 2018-03-28 DIAGNOSIS — E039 Hypothyroidism, unspecified: Secondary | ICD-10-CM | POA: Diagnosis present

## 2018-03-28 DIAGNOSIS — Z7901 Long term (current) use of anticoagulants: Secondary | ICD-10-CM

## 2018-03-28 DIAGNOSIS — I459 Conduction disorder, unspecified: Secondary | ICD-10-CM | POA: Diagnosis not present

## 2018-03-28 DIAGNOSIS — Z953 Presence of xenogenic heart valve: Secondary | ICD-10-CM | POA: Diagnosis not present

## 2018-03-28 DIAGNOSIS — I099 Rheumatic heart disease, unspecified: Secondary | ICD-10-CM | POA: Diagnosis present

## 2018-03-28 DIAGNOSIS — D6959 Other secondary thrombocytopenia: Secondary | ICD-10-CM | POA: Diagnosis not present

## 2018-03-28 DIAGNOSIS — I4821 Permanent atrial fibrillation: Secondary | ICD-10-CM | POA: Diagnosis present

## 2018-03-28 DIAGNOSIS — I4811 Longstanding persistent atrial fibrillation: Secondary | ICD-10-CM | POA: Diagnosis not present

## 2018-03-28 DIAGNOSIS — I4891 Unspecified atrial fibrillation: Secondary | ICD-10-CM

## 2018-03-28 DIAGNOSIS — I2510 Atherosclerotic heart disease of native coronary artery without angina pectoris: Secondary | ICD-10-CM | POA: Diagnosis present

## 2018-03-28 DIAGNOSIS — Z79890 Hormone replacement therapy (postmenopausal): Secondary | ICD-10-CM | POA: Diagnosis not present

## 2018-03-28 DIAGNOSIS — Z9889 Other specified postprocedural states: Secondary | ICD-10-CM

## 2018-03-28 DIAGNOSIS — I350 Nonrheumatic aortic (valve) stenosis: Secondary | ICD-10-CM | POA: Diagnosis not present

## 2018-03-28 DIAGNOSIS — I482 Chronic atrial fibrillation, unspecified: Secondary | ICD-10-CM | POA: Diagnosis not present

## 2018-03-28 DIAGNOSIS — I080 Rheumatic disorders of both mitral and aortic valves: Secondary | ICD-10-CM | POA: Diagnosis present

## 2018-03-28 HISTORY — DX: Personal history of other diseases of the circulatory system: Z86.79

## 2018-03-28 HISTORY — DX: Longstanding persistent atrial fibrillation: I48.11

## 2018-03-28 HISTORY — DX: Presence of xenogenic heart valve: Z95.3

## 2018-03-28 HISTORY — DX: Other specified postprocedural states: Z98.890

## 2018-03-28 HISTORY — DX: Chronic diastolic (congestive) heart failure: I50.32

## 2018-03-28 HISTORY — PX: MINIMALLY INVASIVE MAZE PROCEDURE: SHX6244

## 2018-03-28 HISTORY — PX: MITRAL VALVE REPLACEMENT: SHX147

## 2018-03-28 HISTORY — PX: TEE WITHOUT CARDIOVERSION: SHX5443

## 2018-03-28 LAB — COOXEMETRY PANEL
CARBOXYHEMOGLOBIN: 1.4 % (ref 0.5–1.5)
METHEMOGLOBIN: 1.2 % (ref 0.0–1.5)
O2 Saturation: 63.2 %
Total hemoglobin: 9.3 g/dL — ABNORMAL LOW (ref 12.0–16.0)

## 2018-03-28 LAB — POCT I-STAT, CHEM 8
BUN: 12 mg/dL (ref 8–23)
BUN: 10 mg/dL (ref 8–23)
BUN: 12 mg/dL (ref 8–23)
BUN: 11 mg/dL (ref 8–23)
BUN: 11 mg/dL (ref 8–23)
BUN: 9 mg/dL (ref 8–23)
BUN: 9 mg/dL (ref 8–23)
CALCIUM ION: 1.25 mmol/L (ref 1.15–1.40)
CALCIUM ION: 1.05 mmol/L — AB (ref 1.15–1.40)
CALCIUM ION: 1.2 mmol/L (ref 1.15–1.40)
CALCIUM ION: 1.1 mmol/L — AB (ref 1.15–1.40)
CHLORIDE: 106 mmol/L (ref 98–111)
CHLORIDE: 106 mmol/L (ref 98–111)
CREATININE: 0.5 mg/dL (ref 0.44–1.00)
CREATININE: 0.5 mg/dL (ref 0.44–1.00)
CREATININE: 0.5 mg/dL (ref 0.44–1.00)
CREATININE: 0.6 mg/dL (ref 0.44–1.00)
CREATININE: 0.6 mg/dL (ref 0.44–1.00)
CREATININE: 0.6 mg/dL (ref 0.44–1.00)
Calcium, Ion: 1.13 mmol/L — ABNORMAL LOW (ref 1.15–1.40)
Calcium, Ion: 1.09 mmol/L — ABNORMAL LOW (ref 1.15–1.40)
Calcium, Ion: 1.17 mmol/L (ref 1.15–1.40)
Chloride: 104 mmol/L (ref 98–111)
Chloride: 107 mmol/L (ref 98–111)
Chloride: 102 mmol/L (ref 98–111)
Chloride: 104 mmol/L (ref 98–111)
Chloride: 109 mmol/L (ref 98–111)
Creatinine, Ser: 0.6 mg/dL (ref 0.44–1.00)
GLUCOSE: 94 mg/dL (ref 70–99)
GLUCOSE: 88 mg/dL (ref 70–99)
GLUCOSE: 95 mg/dL (ref 70–99)
GLUCOSE: 123 mg/dL — AB (ref 70–99)
GLUCOSE: 118 mg/dL — AB (ref 70–99)
Glucose, Bld: 118 mg/dL — ABNORMAL HIGH (ref 70–99)
Glucose, Bld: 134 mg/dL — ABNORMAL HIGH (ref 70–99)
HCT: 33 % — ABNORMAL LOW (ref 36.0–46.0)
HCT: 26 % — ABNORMAL LOW (ref 36.0–46.0)
HCT: 28 % — ABNORMAL LOW (ref 36.0–46.0)
HCT: 25 % — ABNORMAL LOW (ref 36.0–46.0)
HCT: 26 % — ABNORMAL LOW (ref 36.0–46.0)
HEMATOCRIT: 26 % — AB (ref 36.0–46.0)
HEMATOCRIT: 26 % — AB (ref 36.0–46.0)
HEMOGLOBIN: 9.5 g/dL — AB (ref 12.0–15.0)
HEMOGLOBIN: 8.8 g/dL — AB (ref 12.0–15.0)
HEMOGLOBIN: 8.8 g/dL — AB (ref 12.0–15.0)
Hemoglobin: 11.2 g/dL — ABNORMAL LOW (ref 12.0–15.0)
Hemoglobin: 8.8 g/dL — ABNORMAL LOW (ref 12.0–15.0)
Hemoglobin: 8.5 g/dL — ABNORMAL LOW (ref 12.0–15.0)
Hemoglobin: 8.8 g/dL — ABNORMAL LOW (ref 12.0–15.0)
POTASSIUM: 3.7 mmol/L (ref 3.5–5.1)
Potassium: 3.8 mmol/L (ref 3.5–5.1)
Potassium: 3.4 mmol/L — ABNORMAL LOW (ref 3.5–5.1)
Potassium: 3.7 mmol/L (ref 3.5–5.1)
Potassium: 3.9 mmol/L (ref 3.5–5.1)
Potassium: 4.1 mmol/L (ref 3.5–5.1)
Potassium: 4.5 mmol/L (ref 3.5–5.1)
SODIUM: 142 mmol/L (ref 135–145)
SODIUM: 137 mmol/L (ref 135–145)
Sodium: 140 mmol/L (ref 135–145)
Sodium: 141 mmol/L (ref 135–145)
Sodium: 139 mmol/L (ref 135–145)
Sodium: 141 mmol/L (ref 135–145)
Sodium: 140 mmol/L (ref 135–145)
TCO2: 29 mmol/L (ref 22–32)
TCO2: 26 mmol/L (ref 22–32)
TCO2: 27 mmol/L (ref 22–32)
TCO2: 29 mmol/L (ref 22–32)
TCO2: 30 mmol/L (ref 22–32)
TCO2: 24 mmol/L (ref 22–32)
TCO2: 23 mmol/L (ref 22–32)

## 2018-03-28 LAB — POCT I-STAT 3, ART BLOOD GAS (G3+)
ACID-BASE DEFICIT: 2 mmol/L (ref 0.0–2.0)
ACID-BASE DEFICIT: 6 mmol/L — AB (ref 0.0–2.0)
Acid-base deficit: 1 mmol/L (ref 0.0–2.0)
Acid-base deficit: 5 mmol/L — ABNORMAL HIGH (ref 0.0–2.0)
BICARBONATE: 24.9 mmol/L (ref 20.0–28.0)
BICARBONATE: 23.1 mmol/L (ref 20.0–28.0)
BICARBONATE: 21.3 mmol/L (ref 20.0–28.0)
Bicarbonate: 23.7 mmol/L (ref 20.0–28.0)
Bicarbonate: 20.1 mmol/L (ref 20.0–28.0)
O2 SAT: 100 %
O2 SAT: 98 %
O2 Saturation: 99 %
O2 Saturation: 97 %
O2 Saturation: 98 %
PCO2 ART: 38.1 mmHg (ref 32.0–48.0)
PCO2 ART: 38.4 mmHg (ref 32.0–48.0)
PH ART: 7.424 (ref 7.350–7.450)
PH ART: 7.399 (ref 7.350–7.450)
PH ART: 7.302 — AB (ref 7.350–7.450)
PO2 ART: 396 mmHg — AB (ref 83.0–108.0)
PO2 ART: 156 mmHg — AB (ref 83.0–108.0)
PO2 ART: 87 mmHg (ref 83.0–108.0)
PO2 ART: 105 mmHg (ref 83.0–108.0)
Patient temperature: 35
TCO2: 26 mmol/L (ref 22–32)
TCO2: 25 mmol/L (ref 22–32)
TCO2: 24 mmol/L (ref 22–32)
TCO2: 23 mmol/L (ref 22–32)
TCO2: 21 mmol/L — ABNORMAL LOW (ref 22–32)
pCO2 arterial: 35.1 mmHg (ref 32.0–48.0)
pCO2 arterial: 41.2 mmHg (ref 32.0–48.0)
pCO2 arterial: 40.3 mmHg (ref 32.0–48.0)
pH, Arterial: 7.417 (ref 7.350–7.450)
pH, Arterial: 7.318 — ABNORMAL LOW (ref 7.350–7.450)
pO2, Arterial: 96 mmHg (ref 83.0–108.0)

## 2018-03-28 LAB — CBC
HCT: 31.7 % — ABNORMAL LOW (ref 36.0–46.0)
HCT: 29.6 % — ABNORMAL LOW (ref 36.0–46.0)
Hemoglobin: 10.3 g/dL — ABNORMAL LOW (ref 12.0–15.0)
Hemoglobin: 9.3 g/dL — ABNORMAL LOW (ref 12.0–15.0)
MCH: 29.7 pg (ref 26.0–34.0)
MCH: 29.4 pg (ref 26.0–34.0)
MCHC: 32.5 g/dL (ref 30.0–36.0)
MCHC: 31.4 g/dL (ref 30.0–36.0)
MCV: 91.4 fL (ref 80.0–100.0)
MCV: 93.7 fL (ref 80.0–100.0)
NRBC: 0 % (ref 0.0–0.2)
PLATELETS: 113 10*3/uL — AB (ref 150–400)
PLATELETS: 125 10*3/uL — AB (ref 150–400)
RBC: 3.47 MIL/uL — ABNORMAL LOW (ref 3.87–5.11)
RBC: 3.16 MIL/uL — ABNORMAL LOW (ref 3.87–5.11)
RDW: 13.6 % (ref 11.5–15.5)
RDW: 13.7 % (ref 11.5–15.5)
WBC: 10.4 10*3/uL (ref 4.0–10.5)
WBC: 12.7 10*3/uL — ABNORMAL HIGH (ref 4.0–10.5)
nRBC: 0 % (ref 0.0–0.2)

## 2018-03-28 LAB — POCT I-STAT 4, (NA,K, GLUC, HGB,HCT)
Glucose, Bld: 133 mg/dL — ABNORMAL HIGH (ref 70–99)
HCT: 28 % — ABNORMAL LOW (ref 36.0–46.0)
HEMOGLOBIN: 9.5 g/dL — AB (ref 12.0–15.0)
Potassium: 3.6 mmol/L (ref 3.5–5.1)
SODIUM: 140 mmol/L (ref 135–145)

## 2018-03-28 LAB — PROTIME-INR
INR: 1.33
Prothrombin Time: 16.4 seconds — ABNORMAL HIGH (ref 11.4–15.2)

## 2018-03-28 LAB — CREATININE, SERUM
Creatinine, Ser: 0.7 mg/dL (ref 0.44–1.00)
GFR calc Af Amer: >60 mL/min (ref >60)
GFR calc non Af Amer: >60 mL/min (ref >60)

## 2018-03-28 LAB — GLUCOSE, CAPILLARY
GLUCOSE-CAPILLARY: 124 mg/dL — AB (ref 70–99)
GLUCOSE-CAPILLARY: 114 mg/dL — AB (ref 70–99)
GLUCOSE-CAPILLARY: 123 mg/dL — AB (ref 70–99)
GLUCOSE-CAPILLARY: 114 mg/dL — AB (ref 70–99)
GLUCOSE-CAPILLARY: 155 mg/dL — AB (ref 70–99)
Glucose-Capillary: 142 mg/dL — ABNORMAL HIGH (ref 70–99)
Glucose-Capillary: 111 mg/dL — ABNORMAL HIGH (ref 70–99)
Glucose-Capillary: 104 mg/dL — ABNORMAL HIGH (ref 70–99)
Glucose-Capillary: 124 mg/dL — ABNORMAL HIGH (ref 70–99)

## 2018-03-28 LAB — PLATELET COUNT: PLATELETS: 129 10*3/uL — AB (ref 150–400)

## 2018-03-28 LAB — APTT: aPTT: 37 seconds — ABNORMAL HIGH (ref 24–36)

## 2018-03-28 LAB — HEMOGLOBIN AND HEMATOCRIT, BLOOD
HCT: 26.4 % — ABNORMAL LOW (ref 36.0–46.0)
Hemoglobin: 8.6 g/dL — ABNORMAL LOW (ref 12.0–15.0)

## 2018-03-28 LAB — MAGNESIUM: Magnesium: 3.1 mg/dL — ABNORMAL HIGH (ref 1.7–2.4)

## 2018-03-28 SURGERY — REPLACEMENT, MITRAL VALVE, MINIMALLY INVASIVE
Anesthesia: General | Site: Chest | Laterality: Right

## 2018-03-28 MED ORDER — CHLORHEXIDINE GLUCONATE 0.12 % MT SOLN
15.0000 mL | OROMUCOSAL | Status: AC
  Administered 2018-03-28: 15 mL via OROMUCOSAL

## 2018-03-28 MED ORDER — EPHEDRINE 5 MG/ML INJ
INTRAVENOUS | Status: AC
  Filled 2018-03-28: qty 10

## 2018-03-28 MED ORDER — FAMOTIDINE IN NACL 20-0.9 MG/50ML-% IV SOLN
20.0000 mg | Freq: Two times a day (BID) | INTRAVENOUS | Status: DC
  Administered 2018-03-28: 20 mg via INTRAVENOUS

## 2018-03-28 MED ORDER — FENTANYL CITRATE (PF) 250 MCG/5ML IJ SOLN
INTRAMUSCULAR | Status: AC
  Filled 2018-03-28: qty 25

## 2018-03-28 MED ORDER — SODIUM CHLORIDE 0.9 % IR SOLN
Status: DC | PRN
  Administered 2018-03-28: 1000 mL

## 2018-03-28 MED ORDER — DEXMEDETOMIDINE HCL IN NACL 200 MCG/50ML IV SOLN: 0.0000 ug/kg/h | INTRAVENOUS | Status: DC

## 2018-03-28 MED ORDER — 0.9 % SODIUM CHLORIDE (POUR BTL) OPTIME
TOPICAL | Status: DC | PRN
  Administered 2018-03-28: 5000 mL

## 2018-03-28 MED ORDER — LIDOCAINE 2% (20 MG/ML) 5 ML SYRINGE
INTRAMUSCULAR | Status: AC
  Filled 2018-03-28: qty 5

## 2018-03-28 MED ORDER — BISACODYL 5 MG PO TBEC
10.0000 mg | DELAYED_RELEASE_TABLET | Freq: Every day | ORAL | Status: DC
  Administered 2018-03-29 – 2018-03-31 (×3): 10 mg via ORAL
  Filled 2018-03-28 (×5): qty 2

## 2018-03-28 MED ORDER — ONDANSETRON HCL 4 MG/2ML IJ SOLN
4.0000 mg | Freq: Four times a day (QID) | INTRAMUSCULAR | Status: DC | PRN
  Administered 2018-03-29 – 2018-04-01 (×5): 4 mg via INTRAVENOUS
  Filled 2018-03-28 (×5): qty 2

## 2018-03-28 MED ORDER — CHLORHEXIDINE GLUCONATE 0.12% ORAL RINSE (MEDLINE KIT)
15.0000 mL | Freq: Two times a day (BID) | OROMUCOSAL | Status: DC
  Administered 2018-03-28: 15 mL via OROMUCOSAL

## 2018-03-28 MED ORDER — LACTATED RINGERS IV SOLN: INTRAVENOUS | Status: DC

## 2018-03-28 MED ORDER — SODIUM CHLORIDE 0.9 % IV SOLN: 250.0000 mL | INTRAVENOUS | Status: DC

## 2018-03-28 MED ORDER — ROCURONIUM BROMIDE 50 MG/5ML IV SOSY
PREFILLED_SYRINGE | INTRAVENOUS | Status: AC
  Filled 2018-03-28: qty 5

## 2018-03-28 MED ORDER — LEVOTHYROXINE SODIUM 112 MCG PO TABS
112.0000 ug | ORAL_TABLET | Freq: Every day | ORAL | Status: DC
  Administered 2018-03-29 – 2018-04-02 (×5): 112 ug via ORAL
  Filled 2018-03-28 (×5): qty 1

## 2018-03-28 MED ORDER — SODIUM CHLORIDE 0.9 % IR SOLN
Status: DC | PRN
  Administered 2018-03-28: 3000 mL

## 2018-03-28 MED ORDER — PROPOFOL 10 MG/ML IV BOLUS
INTRAVENOUS | Status: DC | PRN
  Administered 2018-03-28: 90 mg via INTRAVENOUS

## 2018-03-28 MED ORDER — BISACODYL 10 MG RE SUPP
10.0000 mg | Freq: Every day | RECTAL | Status: DC
  Filled 2018-03-28: qty 1

## 2018-03-28 MED ORDER — LACTATED RINGERS IV SOLN
INTRAVENOUS | Status: DC | PRN
  Administered 2018-03-28: 07:00:00 via INTRAVENOUS

## 2018-03-28 MED ORDER — ONDANSETRON HCL 4 MG/2ML IJ SOLN
INTRAMUSCULAR | Status: AC
  Filled 2018-03-28: qty 2

## 2018-03-28 MED ORDER — ROCURONIUM BROMIDE 50 MG/5ML IV SOSY
PREFILLED_SYRINGE | INTRAVENOUS | Status: AC
  Filled 2018-03-28: qty 25

## 2018-03-28 MED ORDER — ACETAMINOPHEN 160 MG/5ML PO SOLN: 650.0000 mg | Freq: Once | ORAL | Status: AC

## 2018-03-28 MED ORDER — BUPIVACAINE 0.5 % ON-Q PUMP SINGLE CATH 400 ML
400.0000 mL | INJECTION | Status: DC
  Filled 2018-03-28: qty 400

## 2018-03-28 MED ORDER — VANCOMYCIN HCL IN DEXTROSE 1-5 GM/200ML-% IV SOLN
1000.0000 mg | Freq: Once | INTRAVENOUS | Status: AC
  Administered 2018-03-28: 1000 mg via INTRAVENOUS
  Filled 2018-03-28: qty 200

## 2018-03-28 MED ORDER — SODIUM CHLORIDE 0.9 % IV SOLN: INTRAVENOUS | Status: DC

## 2018-03-28 MED ORDER — DOCUSATE SODIUM 100 MG PO CAPS
200.0000 mg | ORAL_CAPSULE | Freq: Every day | ORAL | Status: DC
  Administered 2018-03-29 – 2018-04-01 (×4): 200 mg via ORAL
  Filled 2018-03-28 (×5): qty 2

## 2018-03-28 MED ORDER — ASPIRIN 81 MG PO CHEW: 324.0000 mg | CHEWABLE_TABLET | Freq: Every day | ORAL | Status: DC

## 2018-03-28 MED ORDER — METOPROLOL TARTRATE 12.5 MG HALF TABLET
12.5000 mg | ORAL_TABLET | Freq: Once | ORAL | Status: AC
  Administered 2018-03-28: 12.5 mg via ORAL
  Filled 2018-03-28: qty 1

## 2018-03-28 MED ORDER — ACETAMINOPHEN 160 MG/5ML PO SOLN: 1000.0000 mg | Freq: Four times a day (QID) | ORAL | Status: DC

## 2018-03-28 MED ORDER — SODIUM CHLORIDE 0.9 % IV SOLN
1.5000 g | Freq: Two times a day (BID) | INTRAVENOUS | Status: AC
  Administered 2018-03-28 – 2018-03-30 (×4): 1.5 g via INTRAVENOUS
  Filled 2018-03-28 (×4): qty 1.5

## 2018-03-28 MED ORDER — BUPIVACAINE HCL 0.5 % IJ SOLN
INTRAMUSCULAR | Status: DC | PRN
  Administered 2018-03-28: 10 mL

## 2018-03-28 MED ORDER — PROTAMINE SULFATE 10 MG/ML IV SOLN
INTRAVENOUS | Status: AC
  Filled 2018-03-28: qty 25

## 2018-03-28 MED ORDER — ORAL CARE MOUTH RINSE
15.0000 mL | Freq: Two times a day (BID) | OROMUCOSAL | Status: DC
  Administered 2018-03-29 – 2018-04-01 (×7): 15 mL via OROMUCOSAL

## 2018-03-28 MED ORDER — INSULIN REGULAR BOLUS VIA INFUSION
0.0000 [IU] | Freq: Three times a day (TID) | INTRAVENOUS | Status: DC
  Filled 2018-03-28: qty 10

## 2018-03-28 MED ORDER — PHENYLEPHRINE HCL-NACL 20-0.9 MG/250ML-% IV SOLN: 0.0000 ug/min | INTRAVENOUS | Status: DC

## 2018-03-28 MED ORDER — METOPROLOL TARTRATE 5 MG/5ML IV SOLN: 2.5000 mg | INTRAVENOUS | Status: DC | PRN

## 2018-03-28 MED ORDER — CHLORHEXIDINE GLUCONATE 0.12 % MT SOLN
15.0000 mL | Freq: Once | OROMUCOSAL | Status: AC
  Administered 2018-03-28: 15 mL via OROMUCOSAL
  Filled 2018-03-28: qty 15

## 2018-03-28 MED ORDER — SODIUM CHLORIDE 0.9% FLUSH: 10.0000 mL | INTRAVENOUS | Status: DC | PRN

## 2018-03-28 MED ORDER — SODIUM CHLORIDE 0.9 % IV SOLN
INTRAVENOUS | Status: DC | PRN
  Administered 2018-03-28: 20 ug/min via INTRAVENOUS

## 2018-03-28 MED ORDER — FENTANYL CITRATE (PF) 250 MCG/5ML IJ SOLN
INTRAMUSCULAR | Status: DC | PRN
  Administered 2018-03-28: 150 ug via INTRAVENOUS
  Administered 2018-03-28: 200 ug via INTRAVENOUS
  Administered 2018-03-28: 50 ug via INTRAVENOUS
  Administered 2018-03-28: 100 ug via INTRAVENOUS
  Administered 2018-03-28 (×2): 250 ug via INTRAVENOUS

## 2018-03-28 MED ORDER — SODIUM CHLORIDE 0.9 % IV SOLN
INTRAVENOUS | Status: DC
  Administered 2018-03-28: 14:00:00 via INTRAVENOUS

## 2018-03-28 MED ORDER — MILRINONE LACTATE IN DEXTROSE 20-5 MG/100ML-% IV SOLN
0.0000 ug/kg/min | INTRAVENOUS | Status: DC
  Administered 2018-03-28 – 2018-03-29 (×2): 0.25 ug/kg/min via INTRAVENOUS
  Filled 2018-03-28 (×2): qty 100

## 2018-03-28 MED ORDER — SODIUM CHLORIDE 0.9% FLUSH
10.0000 mL | Freq: Two times a day (BID) | INTRAVENOUS | Status: DC
  Administered 2018-03-29 – 2018-04-01 (×5): 10 mL

## 2018-03-28 MED ORDER — ROCURONIUM BROMIDE 100 MG/10ML IV SOLN
INTRAVENOUS | Status: DC | PRN
  Administered 2018-03-28: 30 mg via INTRAVENOUS
  Administered 2018-03-28: 70 mg via INTRAVENOUS
  Administered 2018-03-28 (×3): 50 mg via INTRAVENOUS

## 2018-03-28 MED ORDER — POTASSIUM CHLORIDE 10 MEQ/50ML IV SOLN
10.0000 meq | INTRAVENOUS | Status: AC
  Administered 2018-03-28 (×3): 10 meq via INTRAVENOUS

## 2018-03-28 MED ORDER — ORAL CARE MOUTH RINSE
15.0000 mL | OROMUCOSAL | Status: DC
  Administered 2018-03-28: 15 mL via OROMUCOSAL

## 2018-03-28 MED ORDER — NITROGLYCERIN IN D5W 200-5 MCG/ML-% IV SOLN: 0.0000 ug/min | INTRAVENOUS | Status: DC

## 2018-03-28 MED ORDER — MILRINONE LACTATE IN DEXTROSE 20-5 MG/100ML-% IV SOLN: 0.2500 ug/kg/min | INTRAVENOUS | Status: DC

## 2018-03-28 MED ORDER — DEXAMETHASONE SODIUM PHOSPHATE 10 MG/ML IJ SOLN
INTRAMUSCULAR | Status: AC
  Filled 2018-03-28: qty 2

## 2018-03-28 MED ORDER — MIDAZOLAM HCL 5 MG/5ML IJ SOLN
INTRAMUSCULAR | Status: DC | PRN
  Administered 2018-03-28: 2 mg via INTRAVENOUS
  Administered 2018-03-28: 1 mg via INTRAVENOUS
  Administered 2018-03-28: 2 mg via INTRAVENOUS
  Administered 2018-03-28: 3 mg via INTRAVENOUS
  Administered 2018-03-28: 2 mg via INTRAVENOUS

## 2018-03-28 MED ORDER — SODIUM CHLORIDE 0.9 % IV SOLN
INTRAVENOUS | Status: DC | PRN
  Administered 2018-03-28: 25 ug/min via INTRAVENOUS

## 2018-03-28 MED ORDER — MORPHINE SULFATE (PF) 2 MG/ML IV SOLN: 1.0000 mg | INTRAVENOUS | Status: DC | PRN

## 2018-03-28 MED ORDER — SUCCINYLCHOLINE CHLORIDE 200 MG/10ML IV SOSY
PREFILLED_SYRINGE | INTRAVENOUS | Status: AC
  Filled 2018-03-28: qty 10

## 2018-03-28 MED ORDER — ACETAMINOPHEN 650 MG RE SUPP
650.0000 mg | Freq: Once | RECTAL | Status: AC
  Administered 2018-03-28: 650 mg via RECTAL

## 2018-03-28 MED ORDER — ASPIRIN EC 325 MG PO TBEC
325.0000 mg | DELAYED_RELEASE_TABLET | Freq: Every day | ORAL | Status: DC
  Administered 2018-03-29: 325 mg via ORAL
  Filled 2018-03-28: qty 1

## 2018-03-28 MED ORDER — MAGNESIUM SULFATE 4 GM/100ML IV SOLN
4.0000 g | Freq: Once | INTRAVENOUS | Status: AC
  Administered 2018-03-28: 4 g via INTRAVENOUS
  Filled 2018-03-28: qty 100

## 2018-03-28 MED ORDER — BUPIVACAINE 0.5 % ON-Q PUMP SINGLE CATH 400 ML
INJECTION | Status: AC | PRN
  Administered 2018-03-28: 400 mL

## 2018-03-28 MED ORDER — PROPOFOL 10 MG/ML IV BOLUS
INTRAVENOUS | Status: AC
  Filled 2018-03-28: qty 20

## 2018-03-28 MED ORDER — MIDAZOLAM HCL 2 MG/2ML IJ SOLN: 2.0000 mg | INTRAMUSCULAR | Status: DC | PRN

## 2018-03-28 MED ORDER — BUPIVACAINE HCL (PF) 0.5 % IJ SOLN
INTRAMUSCULAR | Status: AC
  Filled 2018-03-28: qty 30

## 2018-03-28 MED ORDER — LACTATED RINGERS IV SOLN: 500.0000 mL | Freq: Once | INTRAVENOUS | Status: DC | PRN

## 2018-03-28 MED ORDER — PROTAMINE SULFATE 10 MG/ML IV SOLN
INTRAVENOUS | Status: DC | PRN
  Administered 2018-03-28: 250 mg via INTRAVENOUS

## 2018-03-28 MED ORDER — HEPARIN SODIUM (PORCINE) 1000 UNIT/ML IJ SOLN
INTRAMUSCULAR | Status: AC
  Filled 2018-03-28: qty 1

## 2018-03-28 MED ORDER — MIDAZOLAM HCL (PF) 10 MG/2ML IJ SOLN
INTRAMUSCULAR | Status: AC
  Filled 2018-03-28: qty 2

## 2018-03-28 MED ORDER — CHLORHEXIDINE GLUCONATE 4 % EX LIQD: 30.0000 mL | CUTANEOUS | Status: DC

## 2018-03-28 MED ORDER — OXYCODONE HCL 5 MG PO TABS
5.0000 mg | ORAL_TABLET | ORAL | Status: DC | PRN
  Administered 2018-03-29 – 2018-03-30 (×5): 5 mg via ORAL
  Administered 2018-03-31 – 2018-04-01 (×4): 10 mg via ORAL
  Administered 2018-04-02: 5 mg via ORAL
  Filled 2018-03-28: qty 2
  Filled 2018-03-28: qty 1
  Filled 2018-03-28: qty 2
  Filled 2018-03-28 (×2): qty 1
  Filled 2018-03-28 (×2): qty 2
  Filled 2018-03-28 (×2): qty 1
  Filled 2018-03-28: qty 2

## 2018-03-28 MED ORDER — TRAMADOL HCL 50 MG PO TABS
50.0000 mg | ORAL_TABLET | ORAL | Status: DC | PRN
  Administered 2018-03-29 – 2018-03-30 (×3): 50 mg via ORAL
  Administered 2018-03-30 – 2018-03-31 (×2): 100 mg via ORAL
  Filled 2018-03-28 (×2): qty 1
  Filled 2018-03-28: qty 2
  Filled 2018-03-28: qty 1
  Filled 2018-03-28: qty 2

## 2018-03-28 MED ORDER — HEPARIN SODIUM (PORCINE) 1000 UNIT/ML IJ SOLN
INTRAMUSCULAR | Status: DC | PRN
  Administered 2018-03-28: 27000 [IU] via INTRAVENOUS

## 2018-03-28 MED ORDER — SODIUM CHLORIDE 0.9% FLUSH: 3.0000 mL | Freq: Two times a day (BID) | INTRAVENOUS | Status: DC

## 2018-03-28 MED ORDER — PANTOPRAZOLE SODIUM 40 MG PO TBEC
40.0000 mg | DELAYED_RELEASE_TABLET | Freq: Every day | ORAL | Status: DC
  Administered 2018-03-30 – 2018-04-02 (×4): 40 mg via ORAL
  Filled 2018-03-28 (×4): qty 1

## 2018-03-28 MED ORDER — SODIUM CHLORIDE 0.9% FLUSH: 3.0000 mL | INTRAVENOUS | Status: DC | PRN

## 2018-03-28 MED ORDER — ACETAMINOPHEN 500 MG PO TABS
1000.0000 mg | ORAL_TABLET | Freq: Four times a day (QID) | ORAL | Status: DC
  Administered 2018-03-29 – 2018-04-02 (×18): 1000 mg via ORAL
  Filled 2018-03-28 (×18): qty 2

## 2018-03-28 MED ORDER — LACTATED RINGERS IV SOLN
INTRAVENOUS | Status: DC | PRN
  Administered 2018-03-28 (×2): via INTRAVENOUS

## 2018-03-28 MED ORDER — ALBUMIN HUMAN 5 % IV SOLN
250.0000 mL | INTRAVENOUS | Status: DC | PRN
  Administered 2018-03-28 (×3): 12.5 g via INTRAVENOUS
  Filled 2018-03-28: qty 500
  Filled 2018-03-28: qty 250

## 2018-03-28 MED ORDER — PHENYLEPHRINE 40 MCG/ML (10ML) SYRINGE FOR IV PUSH (FOR BLOOD PRESSURE SUPPORT)
PREFILLED_SYRINGE | INTRAVENOUS | Status: AC
  Filled 2018-03-28: qty 10

## 2018-03-28 MED ORDER — CHLORHEXIDINE GLUCONATE CLOTH 2 % EX PADS
6.0000 | MEDICATED_PAD | Freq: Every day | CUTANEOUS | Status: DC
  Administered 2018-03-28 – 2018-03-31 (×4): 6 via TOPICAL

## 2018-03-28 MED ORDER — SODIUM CHLORIDE 0.45 % IV SOLN
INTRAVENOUS | Status: DC | PRN
  Administered 2018-03-28: 14:00:00 via INTRAVENOUS

## 2018-03-28 MED ORDER — INSULIN REGULAR(HUMAN) IN NACL 100-0.9 UT/100ML-% IV SOLN: INTRAVENOUS | Status: DC

## 2018-03-28 MED FILL — Magnesium Sulfate Inj 50%: INTRAMUSCULAR | Qty: 10 | Status: AC

## 2018-03-28 MED FILL — Heparin Sodium (Porcine) Inj 1000 Unit/ML: INTRAMUSCULAR | Qty: 2500 | Status: AC

## 2018-03-28 MED FILL — Heparin Sodium (Porcine) Inj 1000 Unit/ML: INTRAMUSCULAR | Qty: 30 | Status: AC

## 2018-03-28 MED FILL — Potassium Chloride Inj 2 mEq/ML: INTRAVENOUS | Qty: 40 | Status: AC

## 2018-03-28 SURGICAL SUPPLY — 104 items
ADAPTER CARDIO PERF ANTE/RETRO (ADAPTER) ×6 IMPLANT
ARTICLIP LAA PROCLIP II 45 (Clip) ×3 IMPLANT
BAG DECANTER FOR FLEXI CONT (MISCELLANEOUS) ×6 IMPLANT
BLADE SURG 11 STRL SS (BLADE) ×6 IMPLANT
CANISTER SUCT 3000ML PPV (MISCELLANEOUS) ×12 IMPLANT
CANNULA FEM VENOUS REMOTE 22FR (CANNULA) ×3 IMPLANT
CANNULA FEMORAL ART 14 SM (MISCELLANEOUS) ×6 IMPLANT
CANNULA GUNDRY RCSP 15FR (MISCELLANEOUS) ×6 IMPLANT
CANNULA OPTISITE PERFUSION 16F (CANNULA) IMPLANT
CANNULA OPTISITE PERFUSION 18F (CANNULA) ×3 IMPLANT
CANNULA SUMP PERICARDIAL (CANNULA) ×12 IMPLANT
CARDIOBLATE CARDIAC ABLATION (MISCELLANEOUS)
CATH KIT ON-Q SILVERSOAK 5IN (CATHETERS) ×3 IMPLANT
CELLS DAT CNTRL 66122 CELL SVR (MISCELLANEOUS) ×2 IMPLANT
CLAMP OLL ABLATION (MISCELLANEOUS) ×3 IMPLANT
CONN ST 1/4X3/8  BEN (MISCELLANEOUS) ×4
CONN ST 1/4X3/8 BEN (MISCELLANEOUS) ×8 IMPLANT
CONNECTOR 1/2X3/8X1/2 3 WAY (MISCELLANEOUS) ×2
CONNECTOR 1/2X3/8X1/2 3WAY (MISCELLANEOUS) ×4 IMPLANT
CONT SPEC 4OZ CLIKSEAL STRL BL (MISCELLANEOUS) ×3 IMPLANT
COVER BACK TABLE 24X17X13 BIG (DRAPES) ×6 IMPLANT
COVER WAND RF STERILE (DRAPES) ×6 IMPLANT
CRADLE DONUT ADULT HEAD (MISCELLANEOUS) ×6 IMPLANT
DERMABOND ADVANCED (GAUZE/BANDAGES/DRESSINGS) ×4
DERMABOND ADVANCED .7 DNX12 (GAUZE/BANDAGES/DRESSINGS) ×8 IMPLANT
DEVICE ATRICLIP LAA PRCLPII 45 (Clip) ×2 IMPLANT
DEVICE CARDIOBLATE CARDIAC ABL (MISCELLANEOUS) IMPLANT
DEVICE SUT CK QUICK LOAD MINI (Prosthesis & Implant Heart) ×6 IMPLANT
DEVICE TROCAR PUNCTURE CLOSURE (ENDOMECHANICALS) ×6 IMPLANT
DRAIN CHANNEL 28F RND 3/8 FF (WOUND CARE) ×12 IMPLANT
DRAPE BILATERAL SPLIT (DRAPES) ×6 IMPLANT
DRAPE C-ARM 42X72 X-RAY (DRAPES) ×6 IMPLANT
DRAPE CV SPLIT W-CLR ANES SCRN (DRAPES) ×6 IMPLANT
DRAPE INCISE IOBAN 66X45 STRL (DRAPES) ×12 IMPLANT
DRAPE SLUSH/WARMER DISC (DRAPES) ×6 IMPLANT
DRSG AQUACEL AG ADV 3.5X 6 (GAUZE/BANDAGES/DRESSINGS) ×3 IMPLANT
DRSG COVADERM 4X8 (GAUZE/BANDAGES/DRESSINGS) ×3 IMPLANT
ELECT BLADE 6.5 EXT (BLADE) ×6 IMPLANT
ELECT REM PT RETURN 9FT ADLT (ELECTROSURGICAL) ×12
ELECTRODE REM PT RTRN 9FT ADLT (ELECTROSURGICAL) ×8 IMPLANT
FELT TEFLON 1X6 (MISCELLANEOUS) ×3 IMPLANT
FEMORAL VENOUS CANN RAP (CANNULA) IMPLANT
GAUZE SPONGE 4X4 12PLY STRL (GAUZE/BANDAGES/DRESSINGS) ×6 IMPLANT
GAUZE SPONGE 4X4 12PLY STRL LF (GAUZE/BANDAGES/DRESSINGS) ×3 IMPLANT
GLOVE ORTHO TXT STRL SZ7.5 (GLOVE) ×18 IMPLANT
GOWN STRL REUS W/ TWL LRG LVL3 (GOWN DISPOSABLE) ×16 IMPLANT
GOWN STRL REUS W/TWL LRG LVL3 (GOWN DISPOSABLE) ×8
KIT BASIN OR (CUSTOM PROCEDURE TRAY) ×6 IMPLANT
KIT DEVICE SUT COR-KNOT MIS 5 (INSTRUMENTS) ×3 IMPLANT
KIT DILATOR VASC 18G NDL (KITS) ×6 IMPLANT
KIT DRAINAGE VACCUM ASSIST (KITS) ×3 IMPLANT
KIT SUCTION CATH 14FR (SUCTIONS) ×6 IMPLANT
KIT SUT CK MINI COMBO 4X17 (Prosthesis & Implant Heart) ×3 IMPLANT
KIT TURNOVER KIT B (KITS) ×6 IMPLANT
LEAD PACING MYOCARDI (MISCELLANEOUS) ×3 IMPLANT
LINE VENT (MISCELLANEOUS) ×3 IMPLANT
NEEDLE AORTIC ROOT 14G 7F (CATHETERS) ×6 IMPLANT
NS IRRIG 1000ML POUR BTL (IV SOLUTION) ×30 IMPLANT
PACK OPEN HEART (CUSTOM PROCEDURE TRAY) ×6 IMPLANT
PAD ARMBOARD 7.5X6 YLW CONV (MISCELLANEOUS) ×12 IMPLANT
PAD ELECT DEFIB RADIOL ZOLL (MISCELLANEOUS) ×6 IMPLANT
PROBE CRYO2-ABLATION MALLABLE (MISCELLANEOUS) ×3 IMPLANT
RTRCTR WOUND ALEXIS 18CM MED (MISCELLANEOUS) ×3
SET CANNULATION TOURNIQUET (MISCELLANEOUS) ×6 IMPLANT
SET CARDIOPLEGIA MPS 5001102 (MISCELLANEOUS) ×3 IMPLANT
SET IRRIG TUBING LAPAROSCOPIC (IRRIGATION / IRRIGATOR) ×6 IMPLANT
SOLUTION ANTI FOG 6CC (MISCELLANEOUS) ×6 IMPLANT
SUT BONE WAX W31G (SUTURE) ×6 IMPLANT
SUT E-PACK MINIMALLY INVASIVE (SUTURE) ×6 IMPLANT
SUT ETHIBON 2 0 V 52N 30 (SUTURE) ×3 IMPLANT
SUT ETHIBOND (SUTURE) IMPLANT
SUT ETHIBOND 2 0 SH (SUTURE) ×3 IMPLANT
SUT ETHIBOND 2 0 V4 (SUTURE) IMPLANT
SUT ETHIBOND 2 0V4 GREEN (SUTURE) IMPLANT
SUT ETHIBOND 2-0 RB-1 WHT (SUTURE) IMPLANT
SUT ETHIBOND 4 0 TF (SUTURE) IMPLANT
SUT ETHIBOND 5 0 C 1 30 (SUTURE) IMPLANT
SUT ETHIBOND NAB MH 2-0 36IN (SUTURE) IMPLANT
SUT ETHIBOND X763 2 0 SH 1 (SUTURE) ×6 IMPLANT
SUT GORETEX 6.0 TH-9 30 IN (SUTURE) IMPLANT
SUT GORETEX CV 4 TH 22 36 (SUTURE) ×6 IMPLANT
SUT GORETEX CV-5THC-13 36IN (SUTURE) IMPLANT
SUT GORETEX CV4 TH-18 (SUTURE) ×12 IMPLANT
SUT GORETEX TH-18 36 INCH (SUTURE) IMPLANT
SUT PROLENE 3 0 SH1 36 (SUTURE) ×15 IMPLANT
SUT PROLENE 4 0 RB 1 (SUTURE) ×3
SUT PROLENE 4-0 RB1 .5 CRCL 36 (SUTURE) ×6 IMPLANT
SUT SILK  1 MH (SUTURE) ×1
SUT SILK 1 MH (SUTURE) ×2 IMPLANT
SYR 10ML LL (SYRINGE) ×6 IMPLANT
SYSTEM SAHARA CHEST DRAIN ATS (WOUND CARE) ×9 IMPLANT
TAPE CLOTH SURG 4X10 WHT LF (GAUZE/BANDAGES/DRESSINGS) ×3 IMPLANT
TAPE PAPER 2X10 WHT MICROPORE (GAUZE/BANDAGES/DRESSINGS) ×3 IMPLANT
TOWEL GREEN STERILE (TOWEL DISPOSABLE) ×6 IMPLANT
TOWEL GREEN STERILE FF (TOWEL DISPOSABLE) ×6 IMPLANT
TRAY FOLEY SLVR 16FR TEMP STAT (SET/KITS/TRAYS/PACK) ×6 IMPLANT
TROCAR XCEL BLADELESS 5X75MML (TROCAR) ×6 IMPLANT
TROCAR XCEL NON-BLD 11X100MML (ENDOMECHANICALS) ×12 IMPLANT
TUBE SUCT INTRACARD DLP 20F (MISCELLANEOUS) ×6 IMPLANT
TUNNELER SHEATH ON-Q 11GX8 DSP (PAIN MANAGEMENT) IMPLANT
UNDERPAD 30X30 (UNDERPADS AND DIAPERS) ×6 IMPLANT
VALVE MAGNA MITRAL 31MM (Prosthesis & Implant Heart) ×3 IMPLANT
WATER STERILE IRR 1000ML POUR (IV SOLUTION) ×12 IMPLANT
WIRE .035 3MM-J 145CM (WIRE) ×6 IMPLANT

## 2018-03-28 NOTE — Procedures (Signed)
Extubation Procedure Note  Patient Details:   Name: Lorraine Kemp DOB: 1950/05/31 MRN: [id number]   Airway Documentation:    Vent end date: 03/28/18 Vent end time: 2114   Evaluation  O2 sats: stable throughout Complications: No apparent complications Patient did tolerate procedure well. Bilateral Breath Sounds: Clear, Diminished   Yes   Pt extubated per Rapid wean protocol.  VC:1.1L  NIF: > -30 IS: 500.  Patient placed on 3L [place] per rapid wean protocol.  Sp02 is 98%.  BB sounds. No stridor heard.   Clance Boll 03/28/2018, 9:24 PM

## 2018-03-28 NOTE — Interval H&P Note (Signed)
History and Physical Interval Note:  03/28/2018 7:11 AM  Lorraine Kemp  has presented today for surgery, with the diagnosis of MS AFIB  The various methods of treatment have been discussed with the patient and family. After consideration of risks, benefits and other options for treatment, the patient has consented to  Procedure(s) with comments: MINIMALLY INVASIVE MITRAL VALVE (MV) REPLACEMENT (Right) - glutaraldehyde MINIMALLY INVASIVE MAZE PROCEDURE (N/A) TRANSESOPHAGEAL ECHOCARDIOGRAM (TEE) (N/A) as a surgical intervention .  The patient's history has been reviewed, patient examined, no change in status, stable for surgery.  I have reviewed the patient's chart and labs.  Questions were answered to the patient's satisfaction.     Rexene Alberts

## 2018-03-28 NOTE — Anesthesia Procedure Notes (Signed)
Central Venous Catheter Insertion Performed by: Roderic Palau, MD, anesthesiologist Start/End11/22/2019 6:45 AM, 03/28/2018 7:00 AM Patient location: Pre-op. Preanesthetic checklist: patient identified, IV checked, site marked, risks and benefits discussed, surgical consent, monitors and equipment checked, pre-op evaluation, timeout performed and anesthesia consent Hand hygiene performed  and maximum sterile barriers used  PA cath was placed.Swan type:thermodilution PA Cath depth:50 Procedure performed without using ultrasound guided technique. Attempts: 1 Patient tolerated the procedure well with no immediate complications.

## 2018-03-28 NOTE — Progress Notes (Signed)
  Echocardiogram Echocardiogram Transesophageal has been performed.  Lorraine Kemp 03/28/2018, 8:46 AM

## 2018-03-28 NOTE — Progress Notes (Signed)
[facility] BRIEF SICU PROGRESS NOTE  Day of Surgery  S/P Procedure(s) (LRB): MINIMALLY INVASIVE MITRAL VALVE (MV) REPLACEMENT. Magna Mitral Ease 54mm. (Right) MINIMALLY INVASIVE MAZE PROCEDURE (N/A) TRANSESOPHAGEAL ECHOCARDIOGRAM (TEE) (N/A)   Waking up on vent AV paced w/ stable hemodynamics O2 sats 99-100% Chest tube output low UOP excellent Labs okay  Plan: Continue routine early postop  Rexene Alberts, MD 03/28/2018 9:12 PM

## 2018-03-28 NOTE — Progress Notes (Signed)
Patient interviewed in the pre-op area. Patient able to confirm name, DOB, procedure, NDKA, metal (right ankle) in body, npo status and no pain at this time. Patient family at bedside.     Leatha Gilding, RN

## 2018-03-28 NOTE — Brief Op Note (Addendum)
03/28/2018  12:07 PM  PATIENT:  Lorraine Kemp  67 y.o. female  PRE-OPERATIVE DIAGNOSIS:  MS AFIB  POST-OPERATIVE DIAGNOSIS:  MS AFIB  PROCEDURE:  Procedure(s) with comments: MINIMALLY INVASIVE MITRAL VALVE (MV) REPLACEMENT. Magna Mitral Ease 73mm. (Right) - glutaraldehyde MINIMALLY INVASIVE MAZE PROCEDURE (N/A) TRANSESOPHAGEAL ECHOCARDIOGRAM (TEE) (N/A)  SURGEON:  Surgeon(s) and Role:    * Rexene Alberts, MD - Primary  PHYSICIAN ASSISTANT: WAYNE GOLD PA-C  ANESTHESIA:   general  EBL:  750  BLOOD ADMINISTERED:none  DRAINS: PLEURAL AND PERICARDIAL CHEST TUBES   LOCAL MEDICATIONS USED:  BUPIVICAINE   SPECIMEN:  Source of Specimen:  MITRAL VALVE LEAFLETS  DISPOSITION OF SPECIMEN:  PATHOLOGY  COUNTS:  YES  TOURNIQUET:  * No tourniquets in log *  DICTATION: .Dragon Dictation  PLAN OF CARE: Admit to inpatient   PATIENT DISPOSITION:  ICU - intubated and hemodynamically stable.   Delay start of Pharmacological VTE agent (>24hrs) due to surgical blood loss or risk of bleeding: yes

## 2018-03-28 NOTE — Anesthesia Procedure Notes (Signed)
Procedure Name: Intubation Date/Time: 03/28/2018 8:09 AM Performed by: Shirlyn Goltz, CRNA Pre-anesthesia Checklist: Patient identified, Emergency Drugs available, Suction available and Patient being monitored Patient Re-evaluated:Patient Re-evaluated prior to induction Oxygen Delivery Method: Circle system utilized Preoxygenation: Pre-oxygenation with 100% oxygen Induction Type: IV induction Ventilation: Mask ventilation without difficulty Laryngoscope Size: Mac and 4 Grade View: Grade I Endobronchial tube: Left, Double lumen EBT, EBT position confirmed by auscultation and EBT position confirmed by fiberoptic bronchoscope and 37 Fr Number of attempts: 1 Placement Confirmation: ETT inserted through vocal cords under direct vision,  positive ETCO2 and breath sounds checked- equal and bilateral Tube secured with: Tape Dental Injury: Teeth and Oropharynx as per pre-operative assessment

## 2018-03-28 NOTE — Anesthesia Procedure Notes (Signed)
Central Venous Catheter Insertion Performed by: Roderic Palau, MD, anesthesiologist Start/End11/22/2019 6:45 AM, 03/28/2018 7:00 AM Patient location: Pre-op. Preanesthetic checklist: patient identified, IV checked, site marked, risks and benefits discussed, surgical consent, monitors and equipment checked, pre-op evaluation, timeout performed and anesthesia consent Position: Trendelenburg Lidocaine 1% used for infiltration and patient sedated Hand hygiene performed , maximum sterile barriers used  and Seldinger technique used Catheter size: 9 Fr Total catheter length 10. Central line was placed.MAC introducer Swan type:thermodilution Procedure performed using ultrasound guided technique. Ultrasound Notes:anatomy identified, needle tip was noted to be adjacent to the nerve/plexus identified, no ultrasound evidence of intravascular and/or intraneural injection and image(s) printed for medical record Attempts: 1 Following insertion, line sutured, dressing applied and Biopatch. Post procedure assessment: blood return through all ports, free fluid flow and no air  Patient tolerated the procedure well with no immediate complications.

## 2018-03-28 NOTE — Transfer of Care (Signed)
Immediate Anesthesia Transfer of Care Note  Patient: Lorraine Kemp  Procedure(s) Performed: MINIMALLY INVASIVE MITRAL VALVE (MV) REPLACEMENT. Magna Mitral Ease 92mm. (Right Chest) MINIMALLY INVASIVE MAZE PROCEDURE (N/A ) TRANSESOPHAGEAL ECHOCARDIOGRAM (TEE) (N/A )  Patient Location: ICU  Anesthesia Type:General  Level of Consciousness: sedated and Patient remains intubated per anesthesia plan  Airway & Oxygen Therapy: Patient remains intubated per anesthesia plan and Patient placed on Ventilator (see vital sign flow sheet for setting)  Post-op Assessment: Report given to RN and Post -op Vital signs reviewed and stable  Post vital signs: Reviewed and stable ART - 105/58; Hr 80 pacer; spo2 100%  Last Vitals:  Vitals Value Taken Time  BP    Temp 35.3 C 03/28/2018  2:18 PM  Pulse 80 03/28/2018  2:18 PM  Resp 9 03/28/2018  2:18 PM  SpO2 96 % 03/28/2018  2:18 PM  Vitals shown include unvalidated device data.  Last Pain:  Vitals:   03/28/18 0555  TempSrc:   PainSc: 0-No pain         Complications: No apparent anesthesia complications

## 2018-03-28 NOTE — Progress Notes (Signed)
Raid wean started.

## 2018-03-28 NOTE — Anesthesia Postprocedure Evaluation (Signed)
Anesthesia Post Note  Patient: Lorraine Kemp  Procedure(s) Performed: MINIMALLY INVASIVE MITRAL VALVE (MV) REPLACEMENT. Magna Mitral Ease 24mm. (Right Chest) MINIMALLY INVASIVE MAZE PROCEDURE (N/A ) TRANSESOPHAGEAL ECHOCARDIOGRAM (TEE) (N/A )     Patient location during evaluation: SICU Anesthesia Type: General Level of consciousness: sedated Pain management: pain level controlled Vital Signs Assessment: post-procedure vital signs reviewed and stable Respiratory status: patient remains intubated per anesthesia plan Cardiovascular status: stable Postop Assessment: no apparent nausea or vomiting Anesthetic complications: no    Last Vitals:  Vitals:   03/28/18 1445 03/28/18 1500  BP: 109/66 114/74  Pulse: 80 80  Resp: (!) 5 (!) 7  Temp: (!) 35.1 C (!) 35 C  SpO2: 98% 100%    Last Pain:  Vitals:   03/28/18 0555  TempSrc:   PainSc: 0-No pain                 Enmanuel Zufall,W. EDMOND

## 2018-03-28 NOTE — Op Note (Signed)
CARDIOTHORACIC SURGERY OPERATIVE NOTE  Date of Procedure:   03/28/2018  Preoperative Diagnosis:    Rheumatic Heart Disease  Severe Mitral Stenosis  Longstanding Persistent Atrial Fibrillation  Postoperative Diagnosis: Same  Procedure:    Minimally-Invasive Mitral Valve Replacement  Edwards Magna Mitral stented bovine pericardial tissue valve (size 64mm, model #7300TFX, serial #[id number])   Minimally-Invasive Maze Procedure  Complete bilateral atrial lesion set using cryothermy and bipolar radiofrequency ablation  Clipping of Left Atrial Appendage (Atricure left atrial clip, size 45 mm)  Surgeon: Valentina Gu. Roxy Manns, MD  Assistant: John Giovanni, PA-C  Anesthesia: Roderic Palau, MD  Operative Findings:  Rheumatic heart disease with severe mitral stenosis  Normal left ventricular systolic function  Trivial tricuspid regurgitation               BRIEF CLINICAL NOTE AND INDICATIONS FOR SURGERY  [name] with history of rheumatic heart diseasewho has been referred for surgical consultation to discuss treatment options for management of severe symptomatic mitral stenosis.  Patient's cardiac history dates back more than 20 years ago when she first presented with symptoms of congestive heart failure and atrial fibrillation. She was diagnosed with rheumatic mitral stenosis and underwent balloon valvuloplasty at [facility] in 2000. Shortly after that she underwent DC cardioversion, but she quickly went back into atrial fibrillation. She has been chronically anticoagulated using warfarin ever since. She did well and has remained clinically stable until recently. She has been followed for many years by Dr. Stanford Breed with serial echocardiograms. Over the past several months to a year the patient has developed gradual progression of symptoms of exertional shortness of breath and fatigue. Recent follow-up transthoracic and  transesophageal echocardiograms documented the presence of recurrent severe mitral stenosis with mild to moderate mitral regurgitation, mild right ventricular chamber enlargement, and mild to moderate tricuspid regurgitation. She was sent back to Dr. Mina Marble at [facility] to consider repeat balloon valvuloplasty. However, after consultation she has now been referred for elective surgical intervention.  The patient has been seen in consultation and counseled at length regarding the indications, risks and potential benefits of surgery.  All questions have been answered, and the patient provides full informed consent for the operation as described.    DETAILS OF THE OPERATIVE PROCEDURE  Preparation:  The patient is brought to the operating room on the above mentioned date and central monitoring was established by the anesthesia team including placement of Swan-Ganz catheter through the left internal jugular vein.  A radial arterial line is placed. The patient is placed in the supine position on the operating table.  Intravenous antibiotics are administered. General endotracheal anesthesia is induced uneventfully. The patient is initially intubated using a dual lumen endotracheal tube.  A Foley catheter is placed.  Baseline transesophageal echocardiogram was performed.  Findings were notable for classical rheumatic mitral valve disease with severe mitral stenosis. There was mild mitral regurgitation.  The aortic valve appeared normal.  There was normal left ventricular systolic function.  There was normal right ventricular size and function.  There is trivial tricuspid regurgitation.  A soft roll is placed behind the patient's left scapula and the neck gently extended and turned to the left.   The patient's right neck, chest, abdomen, both groins, and both lower extremities are prepared and draped in a sterile manner. A time out procedure is performed.   Surgical Approach:  A right  miniature anterolateral thoracotomy incision is performed. The incision is placed just  lateral to and superior to the right nipple. The pectoralis major muscle is retracted medially and completely preserved. The right pleural space is entered through the 3rd intercostal space. A soft tissue retractor is placed.  Two 11 mm ports are placed through separate stab incisions inferiorly. The right pleural space is insufflated continuously with carbon dioxide gas through the posterior port during the remainder of the operation.  A pledgeted sutures placed through the dome of the right hemidiaphragm and retracted inferiorly to facilitate exposure.  A longitudinal incision is made in the pericardium 3 cm anterior to the phrenic nerve and silk traction sutures are placed on either side of the incision for exposure.   Extracorporeal Cardiopulmonary Bypass:  A small incision is made in the right inguinal crease and the anterior surface of the right common femoral artery and right common femoral vein are identified.  The patient is placed in Trendelenburg position. The right internal jugular vein is cannulated with Seldinger technique and a guidewire advanced into the right atrium. The patient is heparinized systemically. The right internal jugular vein is cannulated with a 14 [place] pediatric femoral venous cannula. Pursestring sutures are placed on the anterior surface of the right common femoral vein and right common femoral artery. The right common femoral vein is cannulated with the Seldinger technique and a guidewire is advanced under transesophageal echocardiogram guidance through the right atrium. The femoral vein is cannulated with a long 22 French femoral venous cannula. The right common femoral artery is cannulated with Seldinger technique and a flexible guidewire is advanced until it can be appreciated intraluminally in the descending thoracic aorta on transesophageal echocardiogram. The femoral artery is  cannulated with an 18 French femoral arterial cannula.  Adequate heparinization is verified.      The entire pre-bypass portion of the operation was notable for stable hemodynamics.  Cardiopulmonary bypass was begun.  Vacuum assist venous drainage is utilized. The incision in the pericardium is extended in both directions. Venous drainage and exposure are notably excellent.    Clipping of Left Atrial Appendage:  The left atrial appendage is obliterated using an Atricure left atrial appendage clip (Atriclip Pro245, size 45 mm).  The clip is applied under thoracoscopic visualization posterior to the aorta and pulmonary artery through the oblique sinus.  The clip was applied prior to application of the aortic crossclamp, with transesophageal echocardiographic confirmation that the clip satisfactorily obliterates the appendage.   Myocardial Protection:  A retrograde cardioplegia cannula is placed through the right atrium into the coronary sinus using transesophageal echocardiogram guidance.  An antegrade cardioplegia cannula is placed in the ascending aorta.  The patient is cooled to 28C systemic temperature.  The aortic cross clamp is applied and cardioplegia is delivered initially in an antegrade fashion through the aortic root using modified [place] cold blood cardioplegia (Kennestone blood cardioplegia protocol).   The initial cardioplegic arrest is rapid with early diastolic arrest.  Repeat doses of cardioplegia are administered at 90 minutes and every 30 minutes thereafter through the coronary sinus catheter in order to maintain completely flat electrocardiogram.  Myocardial protection was felt to be excellent.   Maze Procedure (left atrial lesion set):  Following placement of the aortic crossclamp and the administration of the initial arresting dose of cardioplegia, a left atriotomy incision was performed through the interatrial groove and extended partially across the back wall of the left  atrium after opening the oblique sinus inferiorly.  The mitral valve and floor of the left atrium are exposed  using a self-retaining retractor.    The Atricure CryoICE nitrous oxide cryothermy system is utilized for all cryothermy ablation lesions.  The left atrial lesion set of the Cox cryomaze procedure is performed using 3 minute duration for all cryothermy lesions.  Initially a lesion is placed along the endocardial surface of the left atrium from the caudad apex of the atriotomy incision across the posterior wall of the left atrium onto the posterior mitral annulus.  A mirror image lesion along the epicardial surface is then performed with the probe posterior to the left atrium, crossing over the coronary sinus and extending up the epicardial surface of the posterolateral right atrium.  Two lesions are then performed to create a box isolating all of the pulmonary veins from the remainder of the left atrium.  The first lesion is placed from the cephalad apex of the atriotomy incision across the dome of the left atrium to just anterior to the left sided pulmonary veins.  The second lesion completes the box from the caudad apex of the atriotomy incision across the back wall of the left atrium to connect with the previous lesion just anterior to the left sided pulmonary veins.     Mitral Valve Replacement:  The mitral valve was inspected and notable for classical rheumatic disease.  There was severe thickening, calcification, and fibrosis involving the entire valve itself with typical fishmouth appearance.  Left atrium was dilated.    The entire anterior leaflet of the mitral valve was excised with exception of 2 portions of a pair of primary chordae tendinae with small portion of leaflet tissue to facilitate reimplantation.  These were each reimplanted laterally into the mitral annulus using figure-of-eight 4-0 Prolene suture.  The posterior leaflet was divided in the midline and debulked.  The valve  was sized to accept a 31 mm stented bioprosthetic tissue valve.  Mitral valve replacement was performed using interrupted horizontal mattress pledgeted 2-0 Ethibond sutures with pledgets in the supra annular position.   An [facility] Mitral stented bioprosthetic tissue valve (size 66mm, catalog # [id number], serial # [id number]) was rinsed per manufacture protocol and prepared for implantation.  The valve was implanted uneventfully.  All valve sutures were secured using a Cor-knot device.  The valve is again tested with saline and appears to be perfectly competent with no paravalvular leak. Rewarming is begun.  The atriotomy was closed using a 2-layer closure of running 3-0 Prolene suture after placing a sump drain across the mitral valve to serve as a left ventricular vent.  One final dose of warm retrograde "reanimation dose" cardioplegia was administered retrograde through the coronary sinus catheter while all air was evacuated through the aortic root.  The aortic cross clamp was removed after a total cross clamp time of 107 minutes.   Maze Procedure (right atrial lesion set):  The retrograde cardioplegia cannula was removed and the small hole in the right atrium extended a short distance.  The AtriCure Synergy bipolar radiofrequency ablation clamp is utilized to create a series of linear lesions in the right atrium, each with one limb of the clamp along the endocardial surface and the other along the epicardial surface. The first lesion is placed from the posterior apex of the atriotomy incision and along the lateral wall of the right atrium to reach the lateral aspect of the superior vena cava, connecting with the cryothermy lesion placed previously from the coronary sinus to the epicardial surface of the posterolateral right atrium. A second lesion is placed  in the opposite direction from the posterior apex of the atriotomy incision along the lateral wall to reach the lateral aspect of the inferior vena  cava. A third lesion is placed from the midportion of the atriotomy incision extending at a right angle to reach the tip of the right atrial appendage. A fourth lesion is placed from the anterior apex of the atriotomy incision in an anterior and inferior direction to reach the acute margin of the heart. Finally, the cryotherapy probe is utilized to complete the right atrial lesion set by placing the probe along the endocardial surface of the right atrium from the anterior apex of the atriotomy incision to reach the tricuspid annulus at the 2:00 position. The atriotomy incision is closed with a 2 layer closure of running 4-0 Prolene suture.   Procedure Completion:  Epicardial pacing wires are fixed to the inferior wall of the right ventricule and to the right atrial appendage. The patient is rewarmed to 37C temperature. The left ventricular vent is removed.  The antegrade cardioplegia cannula is removed. The patient is weaned and disconnected from cardiopulmonary bypass.  The patient's rhythm at separation from bypass was AV paced.  The patient was weaned from bypass without any inotropic support. Total cardiopulmonary bypass time for the operation was 171 minutes.  Followup transesophageal echocardiogram performed after separation from bypass revealed a well-seated bioprosthetic tissue valve in the mitral position with a normal function. There was no paravalvular leak.  Left ventricular function was unchanged from preoperatively.  The femoral arterial and venous cannulae were removed uneventfully. There was a palpable pulse in the distal right common femoral artery after removal of the cannula. Protamine was administered to reverse the anticoagulation. The right internal jugular cannula was removed and manual pressure held on the neck for 15 minutes.  Single lung ventilation was begun. The atriotomy closure was inspected for hemostasis. The pericardial sac was drained using a 28 French Bard drain placed  through the anterior port incision.  The pericardium was closed using a patch of core matrix bovine submucosal tissue patch. The right pleural space is irrigated with saline solution and inspected for hemostasis. The right pleural space was drained using a 28 French Bard drain placed through the posterior port incision. The miniature thoracotomy incision was closed in multiple layers in routine fashion. The right groin incision was inspected for hemostasis and closed in multiple layers in routine fashion.  The post-bypass portion of the operation was notable for stable rhythm and hemodynamics.  No blood products were administered during the operation.   Disposition:  The patient tolerated the procedure well.  The patient was reintubated using a single lumen endotracheal tube and subsequently transported to the surgical intensive care unit in stable condition. There were no intraoperative complications. All sponge instrument and needle counts are verified correct at completion of the operation.    Valentina Gu. Roxy Manns MD 03/28/2018 1:55 PM

## 2018-03-29 ENCOUNTER — Inpatient Hospital Stay (HOSPITAL_COMMUNITY): Payer: Medicare HMO

## 2018-03-29 DIAGNOSIS — I4811 Longstanding persistent atrial fibrillation: Secondary | ICD-10-CM

## 2018-03-29 DIAGNOSIS — Z953 Presence of xenogenic heart valve: Secondary | ICD-10-CM

## 2018-03-29 LAB — POCT I-STAT, CHEM 8
BUN: 9 mg/dL (ref 8–23)
CALCIUM ION: 1.29 mmol/L (ref 1.15–1.40)
CREATININE: 0.7 mg/dL (ref 0.44–1.00)
Chloride: 105 mmol/L (ref 98–111)
Glucose, Bld: 120 mg/dL — ABNORMAL HIGH (ref 70–99)
HCT: 23 % — ABNORMAL LOW (ref 36.0–46.0)
Hemoglobin: 7.8 g/dL — ABNORMAL LOW (ref 12.0–15.0)
Potassium: 3.9 mmol/L (ref 3.5–5.1)
Sodium: 138 mmol/L (ref 135–145)
TCO2: 24 mmol/L (ref 22–32)

## 2018-03-29 LAB — CBC
HCT: 26.8 % — ABNORMAL LOW (ref 36.0–46.0)
HEMATOCRIT: 26.7 % — AB (ref 36.0–46.0)
HEMOGLOBIN: 8.6 g/dL — AB (ref 12.0–15.0)
Hemoglobin: 8.5 g/dL — ABNORMAL LOW (ref 12.0–15.0)
MCH: 29.6 pg (ref 26.0–34.0)
MCH: 30.1 pg (ref 26.0–34.0)
MCHC: 31.8 g/dL (ref 30.0–36.0)
MCHC: 32.1 g/dL (ref 30.0–36.0)
MCV: 93 fL (ref 80.0–100.0)
MCV: 93.7 fL (ref 80.0–100.0)
PLATELETS: 98 10*3/uL — AB (ref 150–400)
Platelets: 98 10*3/uL — ABNORMAL LOW (ref 150–400)
RBC: 2.87 MIL/uL — ABNORMAL LOW (ref 3.87–5.11)
RBC: 2.86 MIL/uL — ABNORMAL LOW (ref 3.87–5.11)
RDW: 13.9 % (ref 11.5–15.5)
RDW: 14.5 % (ref 11.5–15.5)
WBC: 10.9 10*3/uL — AB (ref 4.0–10.5)
WBC: 13 10*3/uL — ABNORMAL HIGH (ref 4.0–10.5)
nRBC: 0 % (ref 0.0–0.2)
nRBC: 0 % (ref 0.0–0.2)

## 2018-03-29 LAB — COOXEMETRY PANEL
Carboxyhemoglobin: 1.4 % (ref 0.5–1.5)
Methemoglobin: 1.1 % (ref 0.0–1.5)
O2 Saturation: 60.6 %
Total hemoglobin: 8.9 g/dL — ABNORMAL LOW (ref 12.0–16.0)

## 2018-03-29 LAB — GLUCOSE, CAPILLARY
GLUCOSE-CAPILLARY: 115 mg/dL — AB (ref 70–99)
GLUCOSE-CAPILLARY: 135 mg/dL — AB (ref 70–99)
Glucose-Capillary: 128 mg/dL — ABNORMAL HIGH (ref 70–99)
Glucose-Capillary: 114 mg/dL — ABNORMAL HIGH (ref 70–99)
Glucose-Capillary: 113 mg/dL — ABNORMAL HIGH (ref 70–99)
Glucose-Capillary: 101 mg/dL — ABNORMAL HIGH (ref 70–99)
Glucose-Capillary: 118 mg/dL — ABNORMAL HIGH (ref 70–99)
Glucose-Capillary: 142 mg/dL — ABNORMAL HIGH (ref 70–99)

## 2018-03-29 LAB — BASIC METABOLIC PANEL
Anion gap: 8 (ref 5–15)
BUN: 9 mg/dL (ref 8–23)
CO2: 19 mmol/L — AB (ref 22–32)
CREATININE: 0.72 mg/dL (ref 0.44–1.00)
Calcium: 8.1 mg/dL — ABNORMAL LOW (ref 8.9–10.3)
Chloride: 110 mmol/L (ref 98–111)
GLUCOSE: 122 mg/dL — AB (ref 70–99)
Potassium: 4 mmol/L (ref 3.5–5.1)
Sodium: 137 mmol/L (ref 135–145)

## 2018-03-29 LAB — CREATININE, SERUM: CREATININE: 0.8 mg/dL (ref 0.44–1.00)

## 2018-03-29 LAB — MAGNESIUM
MAGNESIUM: 1.9 mg/dL (ref 1.7–2.4)
Magnesium: 2.5 mg/dL — ABNORMAL HIGH (ref 1.7–2.4)

## 2018-03-29 MED ORDER — FUROSEMIDE 10 MG/ML IJ SOLN
20.0000 mg | Freq: Four times a day (QID) | INTRAMUSCULAR | Status: AC
  Administered 2018-03-29 – 2018-03-30 (×3): 20 mg via INTRAVENOUS
  Filled 2018-03-29 (×3): qty 2

## 2018-03-29 MED ORDER — ENOXAPARIN SODIUM 40 MG/0.4ML ~~LOC~~ SOLN
40.0000 mg | Freq: Every day | SUBCUTANEOUS | Status: DC
  Administered 2018-03-30: 40 mg via SUBCUTANEOUS
  Filled 2018-03-29: qty 0.4

## 2018-03-29 MED ORDER — INSULIN ASPART 100 UNIT/ML ~~LOC~~ SOLN
0.0000 [IU] | SUBCUTANEOUS | Status: DC
  Administered 2018-03-29 – 2018-03-30 (×3): 2 [IU] via SUBCUTANEOUS

## 2018-03-29 MED ORDER — BUPROPION HCL ER (XL) 300 MG PO TB24
300.0000 mg | ORAL_TABLET | Freq: Every day | ORAL | Status: DC
  Administered 2018-03-29 – 2018-04-02 (×5): 300 mg via ORAL
  Filled 2018-03-29: qty 1
  Filled 2018-03-29: qty 2
  Filled 2018-03-29 (×2): qty 1
  Filled 2018-03-29: qty 2

## 2018-03-29 MED ORDER — WARFARIN SODIUM 2.5 MG PO TABS
2.5000 mg | ORAL_TABLET | Freq: Every day | ORAL | Status: DC
  Administered 2018-03-29 – 2018-04-01 (×4): 2.5 mg via ORAL
  Filled 2018-03-29 (×4): qty 1

## 2018-03-29 MED ORDER — METOCLOPRAMIDE HCL 5 MG/ML IJ SOLN
10.0000 mg | Freq: Four times a day (QID) | INTRAMUSCULAR | Status: AC
  Administered 2018-03-29 – 2018-03-30 (×6): 10 mg via INTRAVENOUS
  Filled 2018-03-29 (×6): qty 2

## 2018-03-29 MED ORDER — WARFARIN - PHYSICIAN DOSING INPATIENT
Freq: Every day | Status: DC
  Administered 2018-03-29 – 2018-03-31 (×2)

## 2018-03-29 NOTE — Progress Notes (Signed)
Progress Note  Patient Name: Lorraine Kemp Date of Encounter: 03/29/2018  Primary Cardiologist: Dr Stanford Breed  Subjective   No dyspnea; chest sore  Inpatient Medications    Scheduled Meds: . acetaminophen  1,000 mg Oral Q6H   Or  . acetaminophen (TYLENOL) oral liquid 160 mg/5 mL  1,000 mg Per Tube Q6H  . aspirin EC  325 mg Oral Daily   Or  . aspirin  324 mg Per Tube Daily  . bisacodyl  10 mg Oral Daily   Or  . bisacodyl  10 mg Rectal Daily  . Chlorhexidine Gluconate Cloth  6 each Topical Daily  . docusate sodium  200 mg Oral Daily  . insulin regular  0-10 Units Intravenous TID WC  . levothyroxine  112 mcg Oral QAC breakfast  . mouth rinse  15 mL Mouth Rinse BID  . [START ON 03/30/2018] pantoprazole  40 mg Oral Daily  . sodium chloride flush  10-40 mL Intracatheter Q12H   Continuous Infusions: . sodium chloride Stopped (03/29/18 0600)  . sodium chloride    . sodium chloride    . albumin human 12.5 g (03/28/18 1600)  . cefUROXime (ZINACEF)  IV Stopped (03/29/18 0630)  . dexmedetomidine (PRECEDEX) IV infusion    . insulin 1.1 mL/hr at 03/29/18 0700  . lactated ringers    . lactated ringers    . lactated ringers 20 mL/hr at 03/29/18 0700  . milrinone 0.25 mcg/kg/min (03/29/18 0702)  . nitroGLYCERIN Stopped (03/28/18 1414)  . phenylephrine (NEO-SYNEPHRINE) Adult infusion Stopped (03/29/18 0343)   PRN Meds: sodium chloride, albumin human, lactated ringers, metoprolol tartrate, midazolam, morphine injection, ondansetron (ZOFRAN) IV, oxyCODONE, sodium chloride flush, traMADol   Vital Signs    Vitals:   03/29/18 0500 03/29/18 0600 03/29/18 0630 03/29/18 0700  BP: (!) 111/51   (!) 104/45  Pulse: 80 80 79 79  Resp: 16 16 16 16   Temp: 98.2 F (36.8 C) 98.4 F (36.9 C) 98.2 F (36.8 C) 98.1 F (36.7 C)  TempSrc:      SpO2: 99% 91% 93% 93%  Weight:  82.3 kg    Height:        Intake/Output Summary (Last 24 hours) at 03/29/2018 0837 Last data filed at 03/29/2018  0700 Gross per 24 hour  Intake 5899.79 ml  Output 3660 ml  Net 2239.79 ml   Filed Weights   03/28/18 0555 03/29/18 0600  Weight: 77.1 kg 82.3 kg    Telemetry    AV paced- Personally Reviewed   Physical Exam   GEN: No acute distress.   Neck: No JVD Cardiac: RRR, positive rub Respiratory: Clear to auscultation bilaterally. GI: Soft, nontender, non-distended  MS: No edema Neuro:  Nonfocal  Psych: Normal affect   Labs    Chemistry Recent Labs  Lab 03/25/18 1157  03/28/18 1302 03/28/18 1421 03/28/18 2012 03/28/18 2014 03/29/18 0425  NA 137   < > 141 140 140  --  137  K 4.4   < > 3.7 3.6 4.5  --  4.0  CL 108   < > 106  --  109  --  110  CO2 20*  --   --   --   --   --  19*  GLUCOSE 101*   < > 118* 133* 134*  --  122*  BUN 14   < > 9  --  9  --  9  CREATININE 1.03*   < > 0.60  --  0.60 0.70  0.72  CALCIUM 9.6  --   --   --   --   --  8.1*  PROT 7.4  --   --   --   --   --   --   ALBUMIN 4.0  --   --   --   --   --   --   AST 41  --   --   --   --   --   --   ALT 28  --   --   --   --   --   --   ALKPHOS 60  --   --   --   --   --   --   BILITOT 1.1  --   --   --   --   --   --   GFRNONAA 54*  --   --   --   --  >60 >60  GFRAA >60  --   --   --   --  >60 >60  ANIONGAP 9  --   --   --   --   --  8   < > = values in this interval not displayed.     Hematology Recent Labs  Lab 03/28/18 1426 03/28/18 2012 03/28/18 2014 03/29/18 0425  WBC 10.4  --  12.7* 10.9*  RBC 3.47*  --  3.16* 2.87*  HGB 10.3* 8.8* 9.3* 8.5*  HCT 31.7* 26.0* 29.6* 26.7*  MCV 91.4  --  93.7 93.0  MCH 29.7  --  29.4 29.6  MCHC 32.5  --  31.4 31.8  RDW 13.6  --  13.7 13.9  PLT 113*  --  125* 98*     Radiology    Dg Chest Port 1 View  Result Date: 03/28/2018 CLINICAL DATA:  Postop chest. EXAM: PORTABLE CHEST 1 VIEW COMPARISON:  03/25/2018. FINDINGS: Prior cardiac valve replacement. Tube noted with its tip 2 cm above the lower portion of the carina. NG tube noted with tip and side  hole below left hemidiaphragm. Swan-Ganz catheter tip appears to be in the lower pulmonary outflow tract. Right chest tube noted with tip over the right upper chest. Mediastinal drainage catheter noted over the lower mediastinum. Cardiomegaly. No pulmonary venous congestion low lung volumes with mild bibasilar atelectasis/infiltrates. No pleural effusion or pneumothorax. Surgical clips are noted in the neck. IMPRESSION: 1. Prior cardiac valve replacement. Lines and tubes including right chest tube in good anatomic position as above. No pneumothorax. 2.  Mild cardiomegaly.  No pulmonary venous congestion. 3.  Low lung volumes with mild bibasilar atelectasis/infiltrates. Electronically Signed   By: Marcello Moores  Register   On: 03/28/2018 15:17     Patient Profile     67 y.o. female with past medical history of rheumatic mitral stenosis, permanent atrial fibrillation admitted with progressive dyspnea.  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  Assessment & Plan    1 status post mitral valve replacement-patient doing well postoperatively.  Wean milrinone to off.  2 previous persistent atrial fibrillation-status post Maze procedure.  Rhythm appears to be AV paced this morning.  High likelihood of recurrent atrial fibrillation.  Resume anticoagulation prior to discharge.  For questions or updates, please contact [place] Please consult [email or web address] for contact info under        Signed, Kirk Ruths, MD  03/29/2018, 8:37 AM

## 2018-03-29 NOTE — Progress Notes (Signed)
[address]       [place] [address]             [phone]        CARDIOTHORACIC SURGERY PROGRESS NOTE   R1 Day Post-Op Procedure(s) (LRB): MINIMALLY INVASIVE MITRAL VALVE (MV) REPLACEMENT. Magna Mitral Ease 73mm. (Right) MINIMALLY INVASIVE MAZE PROCEDURE (N/A) TRANSESOPHAGEAL ECHOCARDIOGRAM (TEE) (N/A)  Subjective: Looks good.  Mild soreness in chest.  Some intermittent nausea.  No SOB  Objective: Vital signs: BP Readings from Last 1 Encounters:  03/29/18 (!) 118/56   Pulse Readings from Last 1 Encounters:  03/29/18 80   Resp Readings from Last 1 Encounters:  03/29/18 13   Temp Readings from Last 1 Encounters:  03/29/18 97.9 F (36.6 C)    Hemodynamics: PAP: (17-39)/(6-21) 32/13 CO:  [2.3 L/min-4.1 L/min] 4.1 L/min CI:  [1.2 L/min/m2-2.2 L/min/m2] 2.2 L/min/m2  Mixed venous co-ox 60.6%   Physical Exam:  Rhythm:   Junctional  Breath sounds: clear  Heart sounds:  RRR w/out murmur  Incisions:  Dressings dry, intact  Abdomen:  Soft, non-distended, non-tender  Extremities:  Warm, well-perfused  Chest tubes:  low volume thin serosanguinous output, no air leak    Intake/Output from previous day: 11/22 0701 - 11/23 0700 In: 5899.8 [I.V.:3683.5; Blood:400; IV Piggyback:1816.3] Out: 3660 [Urine:2082; Blood:750; Chest Tube:828] Intake/Output this shift: No intake/output data recorded.  Lab Results:  CBC: Recent Labs    03/28/18 2014 03/29/18 0425  WBC 12.7* 10.9*  HGB 9.3* 8.5*  HCT 29.6* 26.7*  PLT 125* 98*    BMET:  Recent Labs    03/28/18 2012 03/28/18 2014 03/29/18 0425  NA 140  --  137  K 4.5  --  4.0  CL 109  --  110  CO2  --   --  19*  GLUCOSE 134*  --  122*  BUN 9  --  9  CREATININE 0.60 0.70 0.72  CALCIUM  --   --  8.1*     PT/INR:   Recent Labs    03/28/18 1426  LABPROT 16.4*  INR 1.33    CBG (last 3)  Recent Labs    03/29/18 0337 03/29/18 0429 03/29/18 0800  GLUCAP 115* 113* 101*     ABG    Component Value Date/Time   PHART 7.302 (L) 03/28/2018 2222   PCO2ART 40.3 03/28/2018 2222   PO2ART 105.0 03/28/2018 2222   HCO3 20.1 03/28/2018 2222   TCO2 21 (L) 03/28/2018 2222   ACIDBASEDEF 6.0 (H) 03/28/2018 2222   O2SAT 60.6 03/29/2018 0450    CXR: PORTABLE CHEST 1 VIEW  COMPARISON:  03/28/2018 and prior exams  FINDINGS: Cardiomegaly, cardiac valve replacement and LEFT atrial clip again noted.  An endotracheal tube and NG tube have been removed.  A LEFT IJ central venous catheter with tip overlying the main pulmonary artery, mediastinal tube and RIGHT thoracostomy tubes again noted.  There is no evidence of pneumothorax.  Mild bilateral LOWER lung atelectasis again identified.  IMPRESSION: Endotracheal tube and NG tube removal without other significant change. No pneumothorax.   Electronically Signed   By: Margarette Canada M.D.   On: 03/29/2018 08:38   EKG: NSR w/out acute ischemic changes, very long 1st degree AV block, low amplitude P waves   Assessment/Plan: S/P Procedure(s) (LRB): MINIMALLY INVASIVE MITRAL VALVE (MV) REPLACEMENT. Magna Mitral Ease 56mm. (Right) MINIMALLY INVASIVE MAZE PROCEDURE (N/A) TRANSESOPHAGEAL ECHOCARDIOGRAM (TEE) (N/A)  Overall stable POD1 Maintaining stable rhythm and hemodynamics  on low dose milrinone Breathing comfortably w/ O2 sats 98% on 2 L/min, CXR looks good Expected post op acute blood loss anemia, Hgb 8.5 this morning Acute on chronic diastolic CHF with expected post-op volume excess, weight 5 kg > preop Post op thrombocytopenia, mild   Mobilize  Diuresis  D/C lines  Watch anemia, thrombocytopenia  Leave chest tubes in for now  Start Coumadin    Rexene Alberts, MD 03/29/2018 10:30 AM

## 2018-03-29 NOTE — Progress Notes (Signed)
[facility] BRIEF SICU PROGRESS NOTE  1 Day Post-Op  S/P Procedure(s) (LRB): MINIMALLY INVASIVE MITRAL VALVE (MV) REPLACEMENT. Magna Mitral Ease 4mm. (Right) MINIMALLY INVASIVE MAZE PROCEDURE (N/A) TRANSESOPHAGEAL ECHOCARDIOGRAM (TEE) (N/A)   Stable day  Plan: Continue routine care  Rexene Alberts, MD 03/29/2018 5:35 PM

## 2018-03-30 ENCOUNTER — Inpatient Hospital Stay (HOSPITAL_COMMUNITY): Payer: Medicare HMO

## 2018-03-30 LAB — GLUCOSE, CAPILLARY
GLUCOSE-CAPILLARY: 118 mg/dL — AB (ref 70–99)
GLUCOSE-CAPILLARY: 114 mg/dL — AB (ref 70–99)
GLUCOSE-CAPILLARY: 143 mg/dL — AB (ref 70–99)
Glucose-Capillary: 104 mg/dL — ABNORMAL HIGH (ref 70–99)
Glucose-Capillary: 111 mg/dL — ABNORMAL HIGH (ref 70–99)
Glucose-Capillary: 121 mg/dL — ABNORMAL HIGH (ref 70–99)

## 2018-03-30 LAB — PROTIME-INR
INR: 1.44
PROTHROMBIN TIME: 17.4 s — AB (ref 11.4–15.2)

## 2018-03-30 LAB — BASIC METABOLIC PANEL
Anion gap: 5 (ref 5–15)
BUN: 13 mg/dL (ref 8–23)
CO2: 24 mmol/L (ref 22–32)
Calcium: 8.7 mg/dL — ABNORMAL LOW (ref 8.9–10.3)
Chloride: 107 mmol/L (ref 98–111)
Creatinine, Ser: 0.9 mg/dL (ref 0.44–1.00)
GFR calc Af Amer: >60 mL/min (ref >60)
Glucose, Bld: 110 mg/dL — ABNORMAL HIGH (ref 70–99)
POTASSIUM: 3.5 mmol/L (ref 3.5–5.1)
SODIUM: 136 mmol/L (ref 135–145)

## 2018-03-30 LAB — CBC
HCT: 26.3 % — ABNORMAL LOW (ref 36.0–46.0)
HEMOGLOBIN: 8.4 g/dL — AB (ref 12.0–15.0)
MCH: 29.9 pg (ref 26.0–34.0)
MCHC: 31.9 g/dL (ref 30.0–36.0)
MCV: 93.6 fL (ref 80.0–100.0)
Platelets: 101 10*3/uL — ABNORMAL LOW (ref 150–400)
RBC: 2.81 MIL/uL — ABNORMAL LOW (ref 3.87–5.11)
RDW: 14.3 % (ref 11.5–15.5)
WBC: 13 10*3/uL — AB (ref 4.0–10.5)
nRBC: 0 % (ref 0.0–0.2)

## 2018-03-30 MED ORDER — POTASSIUM CHLORIDE CRYS ER 20 MEQ PO TBCR
20.0000 meq | EXTENDED_RELEASE_TABLET | Freq: Two times a day (BID) | ORAL | Status: DC
  Administered 2018-03-31 – 2018-04-01 (×3): 20 meq via ORAL
  Filled 2018-03-30 (×3): qty 1

## 2018-03-30 MED ORDER — POTASSIUM CHLORIDE 10 MEQ/50ML IV SOLN
10.0000 meq | INTRAVENOUS | Status: AC
  Administered 2018-03-30 (×5): 10 meq via INTRAVENOUS
  Filled 2018-03-30 (×3): qty 50

## 2018-03-30 MED ORDER — MOVING RIGHT ALONG BOOK
Freq: Once | Status: AC
  Administered 2018-03-30: 1
  Filled 2018-03-30: qty 1

## 2018-03-30 MED ORDER — FUROSEMIDE 10 MG/ML IJ SOLN
20.0000 mg | Freq: Four times a day (QID) | INTRAMUSCULAR | Status: AC
  Administered 2018-03-30 (×3): 20 mg via INTRAVENOUS
  Filled 2018-03-30 (×3): qty 2

## 2018-03-30 MED ORDER — FUROSEMIDE 40 MG PO TABS
40.0000 mg | ORAL_TABLET | Freq: Two times a day (BID) | ORAL | Status: DC
  Administered 2018-03-31 – 2018-04-02 (×5): 40 mg via ORAL
  Filled 2018-03-30 (×5): qty 1

## 2018-03-30 NOTE — Progress Notes (Signed)
[address]       [place] [address]             [phone]        CARDIOTHORACIC SURGERY PROGRESS NOTE   R2 Days Post-Op Procedure(s) (LRB): MINIMALLY INVASIVE MITRAL VALVE (MV) REPLACEMENT. Magna Mitral Ease 70mm. (Right) MINIMALLY INVASIVE MAZE PROCEDURE (N/A) TRANSESOPHAGEAL ECHOCARDIOGRAM (TEE) (N/A)  Subjective: Looks good.  Slept some.  Minimal pain.  No SOB  Objective: Vital signs: BP Readings from Last 1 Encounters:  03/30/18 (!) 113/45   Pulse Readings from Last 1 Encounters:  03/30/18 63   Resp Readings from Last 1 Encounters:  03/30/18 (!) 8   Temp Readings from Last 1 Encounters:  03/30/18 (!) 97.5 F (36.4 C) (Oral)    Hemodynamics:    Physical Exam:  Rhythm:   Sinus vs junctional  Breath sounds: clear  Heart sounds:  RRR w/out murmur  Incisions:  Dressings dry, intact  Abdomen:  Soft, non-distended, non-tender  Extremities:  Warm, well-perfused  Chest tubes:  Decreasing but significant volume thin serosanguinous output, no air leak    Intake/Output from previous day: 11/23 0701 - 11/24 0700 In: 1206 [P.O.:225; I.V.:716.6; IV Piggyback:264.4] Out: 2220 [Urine:1500; Chest Tube:720] Intake/Output this shift: No intake/output data recorded.  Lab Results:  CBC: Recent Labs    03/29/18 1740 03/30/18 0635  WBC 13.0* 13.0*  HGB 8.6* 8.4*  HCT 26.8* 26.3*  PLT 98* 101*    BMET:  Recent Labs    03/29/18 0425 03/29/18 1609 03/29/18 1613 03/30/18 0635  NA 137 138  --  136  K 4.0 3.9  --  3.5  CL 110 105  --  107  CO2 19*  --   --  24  GLUCOSE 122* 120*  --  110*  BUN 9 9  --  13  CREATININE 0.72 0.70 0.80 0.90  CALCIUM 8.1*  --   --  8.7*     PT/INR:   Recent Labs    03/30/18 0635  LABPROT 17.4*  INR 1.44    CBG (last 3)  Recent Labs    03/30/18 0007 03/30/18 0351 03/30/18 0737  GLUCAP 121* 104* 143*    ABG    Component Value Date/Time   PHART 7.302 (L) 03/28/2018 2222   PCO2ART 40.3  03/28/2018 2222   PO2ART 105.0 03/28/2018 2222   HCO3 20.1 03/28/2018 2222   TCO2 24 03/29/2018 1609   ACIDBASEDEF 6.0 (H) 03/28/2018 2222   O2SAT 60.6 03/29/2018 0450    CXR: PORTABLE CHEST 1 VIEW  COMPARISON:  03/29/2018 and prior radiographs  FINDINGS: Cardiomegaly, mitral valve replacement and LEFT atrial clip again noted.  There has been removal of a LEFT IJ. Swelling is catheter with venous sheath remaining.  A mediastinal tube and RIGHT thoracostomy tube again identified, with tiny RIGHT apical pneumothorax.  Bibasilar atelectasis again noted.  No other changes identified.  IMPRESSION: Swan-Ganz catheter removal without other significant change except for tiny RIGHT apical pneumothorax.   Electronically Signed   By: Margarette Canada M.D.   On: 03/30/2018 07:47  Assessment/Plan: S/P Procedure(s) (LRB): MINIMALLY INVASIVE MITRAL VALVE (MV) REPLACEMENT. Magna Mitral Ease 46mm. (Right) MINIMALLY INVASIVE MAZE PROCEDURE (N/A) TRANSESOPHAGEAL ECHOCARDIOGRAM (TEE) (N/A)  Doing well POD2 Maintaining stable rhythm and BP on low dose milrinone Breathing comfortably w/ O2 sats 98% on 2 L/min, CXR looks good Expected post op acute blood loss anemia, Hgb 8.4 this morning, stable Acute on chronic diastolic CHF  with expected post-op volume excess, weight down but still 4 kg > preop Post op thrombocytopenia, mild, stable   Mobilize  Diuresis  Watch anemia, thrombocytopenia  Leave chest tubes in for now  Start Coumadin  Transfer 4E   Rexene Alberts, MD 03/30/2018 9:41 AM

## 2018-03-30 NOTE — Progress Notes (Signed)
Pt arrived from [facility]. Pt has chest tubes. No air leak. Vitals stable. CCMD notified/telebox 12 applied. CHG bath given. Pt states she has some nausea. Will continue to monitor.  Jerald Kief, RN

## 2018-03-30 NOTE — Plan of Care (Signed)
  Problem: Education: Goal: Knowledge of General Education information will improve Description Including pain rating scale, medication(s)/side effects and non-pharmacologic comfort measures Outcome: Progressing   Problem: Health Behavior/Discharge Planning: Goal: Ability to manage health-related needs will improve Outcome: Progressing   Problem: Clinical Measurements: Goal: Ability to maintain clinical measurements within normal limits will improve Outcome: Progressing Goal: Will remain free from infection Outcome: Progressing Goal: Diagnostic test results will improve Outcome: Progressing Goal: Respiratory complications will improve Outcome: Progressing Goal: Cardiovascular complication will be avoided Outcome: Progressing   Problem: Activity: Goal: Risk for activity intolerance will decrease Outcome: Progressing   Problem: Nutrition: Goal: Adequate nutrition will be maintained Outcome: Progressing   Problem: Coping: Goal: Level of anxiety will decrease Outcome: Progressing   Problem: Elimination: Goal: Will not experience complications related to bowel motility Outcome: Progressing Goal: Will not experience complications related to urinary retention Outcome: Progressing   Problem: Pain Managment: Goal: General experience of comfort will improve Outcome: Progressing   Problem: Safety: Goal: Ability to remain free from injury will improve Outcome: Progressing   Problem: Skin Integrity: Goal: Risk for impaired skin integrity will decrease Outcome: Progressing   Problem: Education: Goal: Knowledge of disease or condition will improve Outcome: Progressing Goal: Knowledge of the prescribed therapeutic regimen will improve Outcome: Progressing   Problem: Activity: Goal: Risk for activity intolerance will decrease Outcome: Progressing   Problem: Cardiac: Goal: Will achieve and/or maintain hemodynamic stability Outcome: Progressing   Problem: Clinical  Measurements: Goal: Postoperative complications will be avoided or minimized Outcome: Progressing   Problem: Respiratory: Goal: Respiratory status will improve Outcome: Progressing   Problem: Skin Integrity: Goal: Wound healing without signs and symptoms of infection Outcome: Progressing Goal: Risk for impaired skin integrity will decrease Outcome: Progressing   Problem: Urinary Elimination: Goal: Ability to achieve and maintain adequate renal perfusion and functioning will improve Outcome: Progressing

## 2018-03-31 ENCOUNTER — Encounter (HOSPITAL_COMMUNITY): Payer: Self-pay | Admitting: Thoracic Surgery (Cardiothoracic Vascular Surgery)

## 2018-03-31 LAB — BASIC METABOLIC PANEL
Anion gap: 7 (ref 5–15)
BUN: 14 mg/dL (ref 8–23)
CHLORIDE: 104 mmol/L (ref 98–111)
CO2: 25 mmol/L (ref 22–32)
CREATININE: 0.93 mg/dL (ref 0.44–1.00)
Calcium: 8.5 mg/dL — ABNORMAL LOW (ref 8.9–10.3)
GFR calc Af Amer: >60 mL/min (ref >60)
GFR calc non Af Amer: >60 mL/min (ref >60)
GLUCOSE: 106 mg/dL — AB (ref 70–99)
POTASSIUM: 3.7 mmol/L (ref 3.5–5.1)
Sodium: 136 mmol/L (ref 135–145)

## 2018-03-31 LAB — CBC
HCT: 25.9 % — ABNORMAL LOW (ref 36.0–46.0)
HEMOGLOBIN: 8.3 g/dL — AB (ref 12.0–15.0)
MCH: 29.5 pg (ref 26.0–34.0)
MCHC: 32 g/dL (ref 30.0–36.0)
MCV: 92.2 fL (ref 80.0–100.0)
PLATELETS: 93 10*3/uL — AB (ref 150–400)
RBC: 2.81 MIL/uL — ABNORMAL LOW (ref 3.87–5.11)
RDW: 14.6 % (ref 11.5–15.5)
WBC: 12 10*3/uL — ABNORMAL HIGH (ref 4.0–10.5)
nRBC: 0.6 % — ABNORMAL HIGH (ref 0.0–0.2)

## 2018-03-31 LAB — PROTIME-INR
INR: 1.81
PROTHROMBIN TIME: 20.7 s — AB (ref 11.4–15.2)

## 2018-03-31 MED ORDER — POTASSIUM CHLORIDE CRYS ER 20 MEQ PO TBCR
40.0000 meq | EXTENDED_RELEASE_TABLET | Freq: Once | ORAL | Status: AC
  Administered 2018-03-31: 40 meq via ORAL
  Filled 2018-03-31: qty 2

## 2018-03-31 MED ORDER — FE FUMARATE-B12-VIT C-FA-IFC PO CAPS
1.0000 | ORAL_CAPSULE | Freq: Three times a day (TID) | ORAL | Status: DC
  Administered 2018-03-31 – 2018-04-02 (×7): 1 via ORAL
  Filled 2018-03-31 (×7): qty 1

## 2018-03-31 NOTE — Progress Notes (Signed)
Right-side chest tube and On-Q pump removed per order. Sutures intact and secured. Sites with scant serosanguinous drainage. Gauze dressing applied. Pt to remain in bed x30 minutes. Pt aware. Call-light within reach. Husband at bedside.   Ara Kussmaul BSN, RN

## 2018-03-31 NOTE — Plan of Care (Signed)
  Problem: Education: Goal: Knowledge of General Education information will improve Description Including pain rating scale, medication(s)/side effects and non-pharmacologic comfort measures Outcome: Progressing   Problem: Health Behavior/Discharge Planning: Goal: Ability to manage health-related needs will improve Outcome: Progressing   Problem: Clinical Measurements: Goal: Ability to maintain clinical measurements within normal limits will improve Outcome: Progressing Goal: Will remain free from infection Outcome: Progressing   Problem: Coping: Goal: Level of anxiety will decrease Outcome: Progressing   Problem: Elimination: Goal: Will not experience complications related to urinary retention Outcome: Progressing   Problem: Pain Managment: Goal: General experience of comfort will improve Outcome: Progressing   Problem: Safety: Goal: Ability to remain free from injury will improve Outcome: Progressing

## 2018-03-31 NOTE — Care Management Important Message (Signed)
Important Message  Patient Details  Name: Lorraine Kemp MRN: [id number] Date of Birth: 16-Aug-1950   Medicare Important Message Given:  Yes    Lucciano Vitali P Kaveon Blatz 03/31/2018, 2:09 PM

## 2018-03-31 NOTE — Progress Notes (Signed)
Epicardial pacing wires removed per order. Wire ends intact. Sites clean and dry. Vitals stable and being monitored per protocol. Bedrest x1 hour. Pt aware. Call-light within reach and family at bedside.   Ara Kussmaul BSN, RN

## 2018-03-31 NOTE — Progress Notes (Signed)
CARDIAC REHAB PHASE I   PRE:  Rate/Rhythm: 55 afib    BP: sitting 109/55    SaO2: 93 RA  MODE:  Ambulation: 120 ft   POST:  Rate/Rhythm: 61 afib    BP: sitting 110/43 back in room     SaO2: 94 RA  Pt had nausea earlier and received zophran. Eager to walk. To BSC, washed hands, then ambulated in hall with rollator. Tolerated fairly well until sudden dizziness and diaphoresis. Had to sit in hall. HR 61 afib, SaO2 93 RA. Took 2 min to get BP machine, 116/43. Pt pale, needed to roll back to room. To recliner, BP after pivoting 110/43. Feeling better but still "washed out". She is frustrated, wanted to walk. Encouraged sitting up, eating, and walking later with staff with rollator. Will f/u tomorrow. [place], ACSM 03/31/2018 10:08 AM

## 2018-03-31 NOTE — Progress Notes (Addendum)
[address]       [place] [address]             [phone]      3 Days Post-Op Procedure(s) (LRB): MINIMALLY INVASIVE MITRAL VALVE (MV) REPLACEMENT. Magna Mitral Ease 8mm. (Right) MINIMALLY INVASIVE MAZE PROCEDURE (N/A) TRANSESOPHAGEAL ECHOCARDIOGRAM (TEE) (N/A) Subjective: Feels ok, denies SOB  Objective: Vital signs in last 24 hours: Temp:  [97.5 F (36.4 C)-97.9 F (36.6 C)] 97.9 F (36.6 C) (11/25 0400) Pulse Rate:  [53-78] 53 (11/25 0400) Cardiac Rhythm: Sinus bradycardia (11/25 0400) Resp:  [7-25] 21 (11/25 0400) BP: (99-132)/(42-64) 112/43 (11/25 0400) SpO2:  [91 %-97 %] 91 % (11/25 0400) Weight:  [77.7 kg] 77.7 kg (11/25 0345)  Hemodynamic parameters for last 24 hours:    Intake/Output from previous day: 11/24 0701 - 11/25 0700 In: 894.2 [P.O.:720; I.V.:110.9; IV Piggyback:63.3] Out: 4627 [Urine:1125; Chest Tube:130] Intake/Output this shift: No intake/output data recorded.  General appearance: alert, cooperative and no distress Heart: regular rate and rhythm, S3 present and soft systolic murmur Lungs: dim left>right bases Abdomen: benign Extremities: + edema Wound: dressings CDI  Lab Results: Recent Labs    03/30/18 0635 03/31/18 0310  WBC 13.0* 12.0*  HGB 8.4* 8.3*  HCT 26.3* 25.9*  PLT 101* 93*   BMET:  Recent Labs    03/30/18 0635 03/31/18 0310  NA 136 136  K 3.5 3.7  CL 107 104  CO2 24 25  GLUCOSE 110* 106*  BUN 13 14  CREATININE 0.90 0.93  CALCIUM 8.7* 8.5*    PT/INR:  Recent Labs    03/31/18 0310  LABPROT 20.7*  INR 1.81   ABG    Component Value Date/Time   PHART 7.302 (L) 03/28/2018 2222   HCO3 20.1 03/28/2018 2222   TCO2 24 03/29/2018 1609   ACIDBASEDEF 6.0 (H) 03/28/2018 2222   O2SAT 60.6 03/29/2018 0450   CBG (last 3)  Recent Labs    03/30/18 0007 03/30/18 0351 03/30/18 0737  GLUCAP 121* 104* 143*    Meds Scheduled Meds: . acetaminophen  1,000 mg Oral Q6H  . bisacodyl  10 mg  Oral Daily   Or  . bisacodyl  10 mg Rectal Daily  . buPROPion  300 mg Oral Daily  . Chlorhexidine Gluconate Cloth  6 each Topical Daily  . docusate sodium  200 mg Oral Daily  . ferrous [id number] C-folic acid  1 capsule Oral TID PC  . furosemide  40 mg Oral BID  . levothyroxine  112 mcg Oral QAC breakfast  . mouth rinse  15 mL Mouth Rinse BID  . pantoprazole  40 mg Oral Daily  . potassium chloride  20 mEq Oral BID  . sodium chloride flush  10-40 mL Intracatheter Q12H  . warfarin  2.5 mg Oral q1800  . Warfarin - Physician Dosing Inpatient   Does not apply q1800   Continuous Infusions: . sodium chloride Stopped (03/30/18 0640)  . lactated ringers    . lactated ringers 10 mL/hr at 03/30/18 1300   PRN Meds:.metoprolol tartrate, morphine injection, ondansetron (ZOFRAN) IV, oxyCODONE, sodium chloride flush, traMADol  Xrays Dg Chest Port 1 View  Result Date: 03/30/2018 CLINICAL DATA:  Status post mitral valve replacement EXAM: PORTABLE CHEST 1 VIEW COMPARISON:  03/29/2018 and prior radiographs FINDINGS: Cardiomegaly, mitral valve replacement and LEFT atrial clip again noted. There has been removal of a LEFT IJ. Swelling is catheter with venous sheath remaining. A mediastinal tube and RIGHT thoracostomy  tube again identified, with tiny RIGHT apical pneumothorax. Bibasilar atelectasis again noted. No other changes identified. IMPRESSION: Swan-Ganz catheter removal without other significant change except for tiny RIGHT apical pneumothorax. Electronically Signed   By: Margarette Canada M.D.   On: 03/30/2018 07:47    Assessment/Plan: S/P Procedure(s) (LRB): MINIMALLY INVASIVE MITRAL VALVE (MV) REPLACEMENT. Magna Mitral Ease 101mm. (Right) MINIMALLY INVASIVE MAZE PROCEDURE (N/A) TRANSESOPHAGEAL ECHOCARDIOGRAM (TEE) (N/A)  1 doing well POD#3 MVR/Maze 2 sinus brady currently with some aflutter and heart block. BP control is good- no beta blocker or neg chronotropes  currently 3 sats good on  RA 4 Mod CT drainage, 350 yesterday, keep CT's for now 5 weight about 2 KG over preop, appears mildly fluid overloaded, + s3 gallop, cont current lasix for now 6 replace K+ 7 Renal fxn/GFR is normal 8 leukocytosis is improved, minor- no fevers 9 expected ABL anemia is stable- on  trinsicon  10 thrombocytopenia trend a little lower- no current clinical implication at this level- monitor 11 CBG' sare well controlled, HgA1c is 5.2 preop- dietary management currently 12 INR 1.8- cont current coumadin dosing 13 routine rehab and pulm toilet  LOS: 3 days    John Giovanni [facility] 03/31/2018 Pager [phone]  I have seen and examined the patient and agree with the assessment and plan as outlined.  Looks good and feels well.  D/C wires and tubes.  Possible D/C home 1-2 days  Rexene Alberts, MD 03/31/2018 8:54 AM

## 2018-04-01 ENCOUNTER — Inpatient Hospital Stay (HOSPITAL_COMMUNITY): Payer: Medicare HMO

## 2018-04-01 LAB — PROTIME-INR
INR: 2.08
Prothrombin Time: 23.1 seconds — ABNORMAL HIGH (ref 11.4–15.2)

## 2018-04-01 MED ORDER — POTASSIUM CHLORIDE 20 MEQ PO PACK
20.0000 meq | PACK | Freq: Two times a day (BID) | ORAL | Status: DC
  Administered 2018-04-01 – 2018-04-02 (×2): 20 meq via ORAL
  Filled 2018-04-01 (×3): qty 1

## 2018-04-01 NOTE — Progress Notes (Signed)
CARDIAC REHAB PHASE I   Offered to walk with pt earlier today, pt declined stating she would walk after lunch. Pt requested rolaltor, provided to pt. RN states she will walk with pt. Will follow-up tomorrow.  Rufina Falco, RN BSN 04/01/2018 2:44 PM

## 2018-04-01 NOTE — Discharge Summary (Addendum)
Physician Discharge Summary  Patient ID: AEVAH STANSBERY MRN: [id number] DOB/AGE: 69-Oct-1952 69 y.o.  Admit date: 03/28/2018 Discharge date: 04/02/2018  Admission Diagnoses: Mitral stenosis                              Atrial fibrillation-long-standing persistent Discharge Diagnoses:  Principal Problem:   S/P minimally invasive mitral valve replacement with bioprosthetic valve + maze procedure Active Problems:   Mitral stenosis   MITRAL REGURGITATION   RHEUMATIC HEART DISEASE   Longstanding persistent atrial fibrillation   Chronic diastolic congestive heart failure (HCC)   S/P Minimally invasive maze operation for atrial fibrillation  Patient Active Problem List   Diagnosis Date Noted  . S/P minimally invasive mitral valve replacement with bioprosthetic valve + maze procedure 03/28/2018  . S/P Minimally invasive maze operation for atrial fibrillation 03/28/2018  . Chronic diastolic congestive heart failure ([place])   . 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 Dizziness 03/20/2012  . Special screening for malignant neoplasms, colon 03/19/2011  . DYSPNEA 02/15/2009  . Mitral stenosis 08/26/2008  . MITRAL REGURGITATION 08/26/2008  . RHEUMATIC HEART DISEASE 08/26/2008  . Longstanding persistent atrial fibrillation    HPI:  [name] referred to Dr. Roxy Manns for cardiothoracic surgical consultation due to history of rheumatic heart disease with rheumatic mitral stenosis.  The patient has a history dating more than 20 years ago when she initially presented with congestive heart failure and atrial fibrillation.  She underwent balloon valvuloplasty of her mitral valve at [facility] in 2000.  She also has a history of DC cardioversion but did return to atrial fibrillation.  She has been on chronic anticoagulation therapy with Coumadin ever since.  She has done well until recently where over  the past several months she has developed a gradual progression of her symptoms of exertional shortness of breath and fatigue.  Follow-up transesophageal echocardiogram confirmed severe mitral stenosis with mild to moderate mitral regurgitation, mild right ventricular chamber enlargement, and mild to moderate tricuspid regurgitation.  She was sent to see Dr. Mina Marble at [facility] to consider repeat balloon valvuloplasty however after consultation she was referred for elective surgical intervention.  Dr. Roxy Manns evaluated the patient and all her studies and agree with recommendations to proceed with surgery and she was admitted this hospitalization for the procedure.    Discharged Condition: good  Hospital Course: The patient was admitted electively and on 03/28/2018 taken the operating room where she underwent the below described procedure.  She tolerated it well and was taken to the surgical intensive care unit in stable condition.  Postoperative hospital course:  The patient has made excellent overall progress.  She initially was weaned from the ventilator using standard protocols without difficulty.  She did initially require some inotropic support with low-dose milrinone but this was weaned without significant difficulty.  She does have an expected acute blood loss anemia which is stable.  Most recent hemoglobin hematocrit dated 04/02/2018 are 8.3 and 26.5 respectively.  Renal function is within normal limits.  Coumadin has been reinitiated and most recent INR is 2.57.  Her dose at time of discharge will be 1 mg of Coumadin per day with reevaluation at the Coumadin clinic for adjustments.  She will also be started on low-dose aspirin.  She does have some mild postoperative volume overload and will be continued on low-dose diuretics until seen  in the office.  Most recent TSH is 6.65 and her levothyroxine is increased to 150 mcg daily.  Her rhythm has been in a sinus bradycardia in the 50s with  intermittent episodes of slow atrial fibrillation.  She is tolerating this well clinically initially with some dizziness which has resolved and she is ambulating the halls well.  Incisions are noted to be healing well without evidence of infection.  She is tolerating diet.  At time of discharge the patient is felt to be quite stable.  Consults: cardiology  Significant Diagnostic Studies: Routine postoperative serial laboratories and chest x-rays.  Treatments: surgery:  CARDIOTHORACIC SURGERY OPERATIVE NOTE  Date of Procedure:                            03/28/2018  Preoperative Diagnosis:         Rheumatic Heart Disease  Severe Mitral Stenosis  Longstanding Persistent Atrial Fibrillation  Postoperative Diagnosis:    Same  Procedure:        Minimally-Invasive Mitral Valve Replacement             Edwards Magna Mitral stented bovine pericardial tissue valve (size 25mm, model #7300TFX, serial #[id number])   Minimally-Invasive Maze Procedure             Complete bilateral atrial lesion set using cryothermy and bipolar radiofrequency ablation             Clipping of Left Atrial Appendage (Atricure left atrial clip, size 45 mm)  Surgeon:        Valentina Gu. Roxy Manns, MD  Assistant:       John Giovanni, PA-C  Anesthesia:    Roderic Palau, MD  Operative Findings: ? Rheumatic heart disease with severe mitral stenosis ? Normal left ventricular systolic function ? Trivial tricuspid regurgitation Discharge Exam: Blood pressure (!) 106/56, pulse (!) 51, temperature 98.5 F (36.9 C), temperature source Oral, resp. rate 18, height 5\' 5"  (1.651 m), weight 77.5 kg, last menstrual period 05/07/2002, SpO2 93 %.  General appearance: alert, cooperative and no distress Heart: regular rate and rhythm and soft systolic murmur Lungs: clear to auscultation bilaterally Abdomen: benign Extremities: no sign edema Wound: incis healing well  Disposition: Discharge disposition: 01-Home  or Self Care       Discharge Instructions    Amb Referral to Cardiac Rehabilitation   Complete by:  As directed    Diagnosis:  Valve Replacement   Valve:  Mitral   Discharge patient   Complete by:  As directed    Discharge disposition:  01-Home or Self Care   Discharge patient date:  04/02/2018     Allergies as of 04/02/2018   No Known Allergies     Medication List    STOP taking these medications   diltiazem 240 MG 24 hr capsule Commonly known as:  CARDIZEM CD   LOVENOX      TAKE these medications   amoxicillin 500 MG capsule Commonly known as:  AMOXIL Take 2,000 mg by mouth See admin instructions. Take 2000 mg by mouth 1 hour prior to dental appointment   aspirin 81 MG chewable tablet Chew 1 tablet (81 mg total) by mouth daily.   buPROPion 300 MG 24 hr tablet Commonly known as:  WELLBUTRIN XL TAKE 1 TABLET DAILY What changed:    how much to take  how to take this  when to take this  additional instructions   CALCIUM-MAGNESIUM-ZINC-D3 PO Take 1  tablet by mouth daily.   Fish Oil 1000 MG Caps Take 1,000 mg by mouth daily.   furosemide 40 MG tablet Commonly known as:  LASIX Take 1 tablet (40 mg total) by mouth daily.   levothyroxine 125 MCG tablet Commonly known as:  SYNTHROID, LEVOTHROID Take 1 tablet (125 mcg total) by mouth daily before breakfast. Start taking on:  04/03/2018 What changed:    medication strength  how much to take   multivitamin with minerals tablet Take 1 tablet by mouth daily.   oxyCODONE 5 MG immediate release tablet Commonly known as:  Oxy IR/ROXICODONE Take 1-2 tablets (5-10 mg total) by mouth every 6 (six) hours as needed for up to 7 days for severe pain.   potassium chloride 20 MEQ packet Commonly known as:  KLOR-CON Take 20 mEq by mouth daily.   warfarin 1 MG tablet Commonly known as:  COUMADIN Take as directed. If you are unsure how to take this medication, talk to your nurse or doctor. Original  instructions:  Take 1 tablet (1 mg total) by mouth daily at 6 PM. As directed by the coumadin clinic What changed:    medication strength  how much to take  when to take this  additional instructions      Follow-up Information    Rexene Alberts, MD Follow up.   Specialty:  Cardiothoracic Surgery Why:  Please see discharge paperwork for a surgeon follow-up appointment.  Please obtain a chest x-ray 1/2-hour prior to this appointment at [place].  [facility] is located in the same office complex on the first floor. Contact information: [address] [place]  [address] [phone]        Erlene Quan, PA-C Follow up.   Specialties:  Cardiology, Radiology Why:  [facility] - 04/15/18 at 8:30am. Please arrive 15 minutes prior to appointment time to check in. Lurena Joiner is one of the PAs that works closely with Dr. Stanford Breed. Contact information: [address] [place] [address] [phone]        [facility] Follow up.   Specialty:  Cardiology Why:  [facility] - 04/08/18 at 9:05am for coumadin check Contact information: [address] [place] [phone]         The patient has been discharged on:   1.Beta Blocker:  Yes [   ]                              No   [ n  ]                              If No, reason: Bradycardia  2.Ace Inhibitor/ARB: Yes [   ]                                     No  [  n  ]                                     If No, reason: Blood pressure would not tolerate currently  3.Statin:   Yes [   ]  No  [ n  ]                  If No, reason: No history of hyperlipidemia or coronary artery disease  4.Shela CommonsVelta Addison  [  y ]                  No   [   ]                  If No, reason:  Signed: John Giovanni PA-C 04/02/2018, 11:27 AM

## 2018-04-01 NOTE — Discharge Instructions (Signed)
Mitral Valve Replacement, Care After This sheet gives you information about how to care for yourself after your procedure. Your health care provider may also give you more specific instructions. If you have problems or questions, contact your health care provider. What can I expect after the procedure? After the procedure, it is common to have: Thoracotomy, Care After This sheet gives you information about how to care for yourself after your procedure. Your doctor may also give you more specific instructions. If you have problems or questions, contact your doctor. Follow these instructions at home: Preventing lung infection ( pneumonia)  Take deep breaths or do breathing exercises as told by your doctor.  Cough often. Coughing is important to clear thick spit (phlegm) and open your lungs. If coughing hurts, hold a pillow against your chest or place both hands flat on top of your cut (splinting) when you cough. This may help with discomfort.  Use an incentive spirometer as told. This is a tool that measures how well you fill your lungs with each breath.  Do lung therapy (pulmonary rehabilitation) as told. Medicines  Take over-the-counter or prescription medicines only as told by your doctor.  If you have pain, take pain-relieving medicine before your pain gets very bad. This will help you breathe and cough more comfortably.  If you were prescribed an antibiotic medicine, take it as told by your doctor. Do not stop taking the antibiotic even if you start to feel better. Activity  Ask your doctor what activities are safe for you.  Do not travel by airplane for 2 weeks after your chest tube is removed, or until your doctor says that this is safe.  Do not lift anything that is heavier than 10 lb (4.5 kg), or the limit that your doctor tells you, until he or she says that it is safe.  Do not drive until your doctor approves. ? Do not drive or use heavy machinery while taking prescription  pain medicine. Incision care  Follow instructions from your doctor about how to take care of your cut from surgery (incision). Make sure you: ? Wash your hands with soap and water before you change your bandage (dressing). If you cannot use soap and water, use hand sanitizer. ? Change your bandage as told by your doctor. ? Leave stitches (sutures), skin glue, or skin tape (adhesive) strips in place. They may need to stay in place for 2 weeks or longer. If tape strips get loose and curl up, you may trim the loose edges. Do not remove tape strips completely unless your doctor says it is okay.  Keep your bandage dry.  Check your cut from surgery every day for signs of infection. Check for: ? More redness, swelling, or pain. ? More fluid or blood. ? Warmth. ? Pus or a bad smell. Bathing  Do not take baths, swim, or use a hot tub until your doctor approves. You may take showers.  After your bandage has been removed, use soap and water to gently wash your cut from surgery. Do not use anything else to clean your cut unless your doctor tells you to. Eating and drinking  Eat a healthy diet as told by your doctor. A healthy diet includes: ? Fresh fruits and vegetables. ? Whole grains. ? Low-fat (lean) proteins.  Drink enough fluid to keep your pee (urine) clear or pale yellow. General instructions  To prevent or treat trouble pooping (constipation) while you are taking prescription pain medicine, your doctor may recommend that  you: ? Take over-the-counter or prescription medicines. ? Eat foods that are high in fiber. These include fresh fruits and vegetables, whole grains, and beans. ? Limit foods that are high in fat and processed sugars, such as fried and sweet foods.  Do not use any products that contain nicotine or tobacco. These include cigarettes and e-cigarettes. If you need help quitting, ask your doctor.  Avoid secondhand smoke.  Wear compression stockings as told. These help to  prevent blood clots and reduce swelling in your legs.  If you have a chest tube, care for it as told.  Keep all follow-up visits as told by your doctor. This is important. Contact a doctor if:  You have more redness, swelling, or pain around your cut from surgery.  You have more fluid or blood coming from your cut from surgery.  Your cut from surgery feels warm to the touch.  You have pus or a bad smell coming from your cut from surgery.  You have a fever or chills.  Your heartbeat seems uneven.  You feel sick to your stomach (nauseous).  You throw up (vomit).  You have muscle aches.  You have trouble pooping (having a bowel movement). This may mean that you: ? Poop fewer times in a week than normal. ? Have a hard time pooping. ? Have poop that is dry, hard, or bigger than normal. Get help right away if:  You get a rash.  You feel light-headed.  You feel like you might pass out (faint).  You are short of breath.  You have trouble breathing.  You are confused.  You have trouble talking.  You have problems with your seeing (vision).  You are not able to move.  You lose feeling (have numbness) in your: ? Face. ? Arms. ? Legs.  You pass out.  You have a sudden, bad headache.  You feel weak.  You have chest pain.  You have pain that: ? Is very bad. ? Gets worse, even with medicine. Summary  Take deep breaths, do breathing exercises, and cough often. This helps prevent lung infection (pneumonia).  Do not drive until your doctor approves. Do not travel by airplane for 2 weeks after your chest tube is removed, or until your doctor says that this is safe.  Check your cut from surgery every day for signs of infection.  Eat a healthy diet. This includes fresh fruits and vegetables, whole grains, and low-fat (lean) proteins. This information is not intended to replace advice given to you by your health care provider. Make sure you discuss any questions  you have with your health care provider. Document Released: 10/23/2011 Document Revised: 01/16/2016 Document Reviewed: 01/16/2016 Elsevier Interactive Patient Education  2017 [employer]   Pain at the incision area that may last for several weeks.  Follow these instructions at home: Incision care  Follow instructions from your health care provider about how to take care of your incision. Make sure you: ? Wash your hands with soap and water before you change your bandage (dressing). If soap and water are not available, use hand sanitizer. ? Change your dressing as told by your health care provider. ? Leave stitches (sutures), skin glue, or adhesive strips in place. These skin closures may need to stay in place for 2 weeks or longer. If adhesive strip edges start to loosen and curl up, you may trim the loose edges. Do not remove adhesive strips completely unless your health care provider tells you to do  that.  Check your incision area every day for signs of infection. Check for: ? More redness, swelling, or pain. ? More fluid or blood. ? Warmth. ? Pus or a bad smell.  Do not apply powder or lotion to the area. Driving  Do not drive until your health care provider approves.  Do not drive or use heavy machinery while taking prescription pain medicines. Bathing  Do not take baths, swim, or use a hot tub for 2-4 weeks after surgery, or until your health care provider approves. Ask your health care provider if you may take showers.  To wash the incision site, gently wash with soap and water and pat the area dry with a clean towel. Do not rub the incision area. That may cause bleeding. Activity  Rest as told by your health care provider. Ask your health care provider when you can resume normal activities, including sexual activity.  Avoid the following activities for 6-8 weeks, or as long as directed: ? Lifting anything that is heavier than 10 lb (4.5 kg), or the limit that your  health care provider tells you. ? Pushing or pulling things with your arms.  Avoid climbing stairs and using the handrail to pull yourself up for the first 2-3 weeks after surgery.  Avoid airplane travel for 4-6 weeks, or as long as directed.  Avoid sitting for long periods of time and crossing your legs. Get up and move around at least once every 1-2 hours.  If you are taking blood thinners (anticoagulants), avoid activities that have a high risk of injury. Ask your health care provider what activities are safe for you. Lifestyle  Limit alcohol intake to no more than 1 drink a day for nonpregnant women and 2 drinks a day for men. One drink equals 12 oz of beer, 5 oz of wine, or 1 oz of hard liquor.  Do not use any products that contain nicotine or tobacco, such as cigarettes and e-cigarettes. If you need help quitting, ask your health care provider. General instructions  Take your temperature every day and weigh yourself every morning for the first 7 days after surgery. Write your temperatures and weight down and take this record with you to any follow-up visits.  Take over-the-counter and prescription medicines only as told by your health care provider.  To prevent or treat constipation while you are taking prescription pain medicine, your health care provider may recommend that you: ? Drink enough fluid to keep your urine clear or pale yellow. ? Take over-the-counter or prescription medicines. ? Eat foods that are high in fiber, such as fresh fruits and vegetables, whole grains, and beans. ? Limit foods that are high in fat and processed sugars, such as fried and sweet foods.  Follow instructions from your health care provider about eating or drinking restrictions.  Wear compression stockings for at least 2 weeks, or as long as told by your health care provider. These stockings help to prevent blood clots and reduce swelling in your legs. If your ankles are swollen after 2 weeks,  continue to wear the stockings.  Keep all follow-up visits as told by your health care provider. This is important. Contact a health care provider if:  You develop a skin rash.  Your weight is increasing each day over 2-3 days.  You gain 2 lb (1 kg) or more in a single day.  You have a fever. Get help right away if:  You develop chest pain that feels different from the  pain caused by your incision.  You develop shortness of breath or difficulty breathing.  You have more redness, swelling, or pain around your incision.  You have more fluid or blood coming from your incision.  Your incision feels warm to the touch.  You have pus or a bad smell coming from your incision.  You feel light-headed. This information is not intended to replace advice given to you by your health care provider. Make sure you discuss any questions you have with your health care provider. Document Released: 11/10/2004 Document Revised: 02/03/2016 Document Reviewed: 02/03/2016 Elsevier Interactive Patient Education  [employer].

## 2018-04-01 NOTE — Progress Notes (Addendum)
[address]       [place] [address]             [phone]      4 Days Post-Op Procedure(s) (LRB): MINIMALLY INVASIVE MITRAL VALVE (MV) REPLACEMENT. Magna Mitral Ease 43mm. (Right) MINIMALLY INVASIVE MAZE PROCEDURE (N/A) TRANSESOPHAGEAL ECHOCARDIOGRAM (TEE) (N/A) Subjective: Got dizzy with ambulation yesterday , but overall conts to feel better  Objective: Vital signs in last 24 hours: Temp:  [97.6 F (36.4 C)-98.6 F (37 C)] 98.1 F (36.7 C) (11/26 0521) Pulse Rate:  [54-60] 54 (11/26 0521) Cardiac Rhythm: Sinus bradycardia (11/26 0200) Resp:  [17-23] 19 (11/26 0521) BP: (102-118)/(43-63) 117/54 (11/26 0521) SpO2:  [93 %-95 %] 95 % (11/26 0521) Weight:  [79.1 kg] 79.1 kg (11/26 0521)  Hemodynamic parameters for last 24 hours:    Intake/Output from previous day: No intake/output data recorded. Intake/Output this shift: No intake/output data recorded.  General appearance: alert, cooperative and no distress Heart: regular rate and rhythm and occas extrasystoles Lungs: dim right base Abdomen: benign Extremities: minor edema Wound: dressings CDI  Lab Results: Recent Labs    03/30/18 0635 03/31/18 0310  WBC 13.0* 12.0*  HGB 8.4* 8.3*  HCT 26.3* 25.9*  PLT 101* 93*   BMET:  Recent Labs    03/30/18 0635 03/31/18 0310  NA 136 136  K 3.5 3.7  CL 107 104  CO2 24 25  GLUCOSE 110* 106*  BUN 13 14  CREATININE 0.90 0.93  CALCIUM 8.7* 8.5*    PT/INR:  Recent Labs    04/01/18 0412  LABPROT 23.1*  INR 2.08   ABG    Component Value Date/Time   PHART 7.302 (L) 03/28/2018 2222   HCO3 20.1 03/28/2018 2222   TCO2 24 03/29/2018 1609   ACIDBASEDEF 6.0 (H) 03/28/2018 2222   O2SAT 60.6 03/29/2018 0450   CBG (last 3)  Recent Labs    03/30/18 0007 03/30/18 0351 03/30/18 0737  GLUCAP 121* 104* 143*    Meds Scheduled Meds: . acetaminophen  1,000 mg Oral Q6H  . bisacodyl  10 mg Oral Daily   Or  . bisacodyl  10 mg Rectal Daily   . buPROPion  300 mg Oral Daily  . Chlorhexidine Gluconate Cloth  6 each Topical Daily  . docusate sodium  200 mg Oral Daily  . ferrous [id number] C-folic acid  1 capsule Oral TID PC  . furosemide  40 mg Oral BID  . levothyroxine  112 mcg Oral QAC breakfast  . mouth rinse  15 mL Mouth Rinse BID  . pantoprazole  40 mg Oral Daily  . potassium chloride  20 mEq Oral BID  . sodium chloride flush  10-40 mL Intracatheter Q12H  . warfarin  2.5 mg Oral q1800  . Warfarin - Physician Dosing Inpatient   Does not apply q1800   Continuous Infusions: . sodium chloride Stopped (03/30/18 0640)  . lactated ringers    . lactated ringers 10 mL/hr at 03/30/18 1300   PRN Meds:.metoprolol tartrate, morphine injection, ondansetron (ZOFRAN) IV, oxyCODONE, sodium chloride flush, traMADol  Xrays No results found.  Assessment/Plan: S/P Procedure(s) (LRB): MINIMALLY INVASIVE MITRAL VALVE (MV) REPLACEMENT. Magna Mitral Ease 84mm. (Right) MINIMALLY INVASIVE MAZE PROCEDURE (N/A) TRANSESOPHAGEAL ECHOCARDIOGRAM (TEE) (N/A)  1 conts to progress , POD#4 MVR/Maze 2 sinus brady v  junct brady( hard to see p on tele), some PVC's. Will check orthostatics as she did get dizzy with ambulation. BP is well controlled 3 some  volume overload, cont lasix, may need to decrease soon if orthostatic 4 INR 2.08, cont coumadin at current dose 5 no other new labs today 6 sats good on RA- cont pulm toilet/rehab 7 poss home 1-2 days  LOS: 4 days    John Giovanni PA-C 04/01/2018 Pager [phone]   I have seen and examined the patient and agree with the assessment and plan as outlined.  Rexene Alberts, MD

## 2018-04-01 NOTE — Progress Notes (Addendum)
Progress Note  Patient Name: Lorraine Kemp Date of Encounter: 04/01/2018  Primary Cardiologist: Kirk Ruths, MD  Subjective   Steadily feeling better this afternoon. No CP. Wishes her hair would cooperate lying flatter but jovial that this is her only main concern.  Inpatient Medications    Scheduled Meds: . acetaminophen  1,000 mg Oral Q6H  . bisacodyl  10 mg Oral Daily   Or  . bisacodyl  10 mg Rectal Daily  . buPROPion  300 mg Oral Daily  . Chlorhexidine Gluconate Cloth  6 each Topical Daily  . docusate sodium  200 mg Oral Daily  . ferrous [id number] C-folic acid  1 capsule Oral TID PC  . furosemide  40 mg Oral BID  . levothyroxine  112 mcg Oral QAC breakfast  . mouth rinse  15 mL Mouth Rinse BID  . pantoprazole  40 mg Oral Daily  . potassium chloride  20 mEq Oral BID  . sodium chloride flush  10-40 mL Intracatheter Q12H  . warfarin  2.5 mg Oral q1800  . Warfarin - Physician Dosing Inpatient   Does not apply q1800   Continuous Infusions: . sodium chloride Stopped (03/30/18 0640)  . lactated ringers    . lactated ringers 10 mL/hr at 03/30/18 1300   PRN Meds: metoprolol tartrate, morphine injection, ondansetron (ZOFRAN) IV, oxyCODONE, sodium chloride flush, traMADol   Vital Signs    Vitals:   03/31/18 2000 03/31/18 2350 04/01/18 0521 04/01/18 1042  BP: (!) 118/54  (!) 117/54   Pulse: (!) 56  (!) 54   Resp: 17  19 18   Temp: 98.5 F (36.9 C) 98.6 F (37 C) 98.1 F (36.7 C)   TempSrc: Oral Oral Oral   SpO2: 93% 94% 95%   Weight:   79.1 kg   Height:       No intake or output data in the 24 hours ending 04/01/18 1439 Filed Weights   03/30/18 0600 03/31/18 0345 04/01/18 0521  Weight: 81.7 kg 77.7 kg 79.1 kg    Telemetry    Appears to be afib/flutter today, rate controlled - Personally Reviewed  Physical Exam   GEN: No acute distress.  HEENT: Normocephalic, atraumatic, sclera non-icteric. Neck: No JVD or bruits. Cardiac: Irregularly  irregular no murmurs, rubs, or gallops.  Radials/DP/PT 1+ and equal bilaterally.  Respiratory: Slightly diminished R base otherwise clear to auscultation bilaterally. Breathing is unlabored. GI: Soft, nontender, non-distended, BS +x 4. MS: no deformity. Extremities: No clubbing or cyanosis. Trace sockline BLE edema. Distal pedal pulses are 2+ and equal bilaterally. Neuro:  AAOx3. Follows commands. Psych:  Responds to questions appropriately with a normal affect.  Labs    Chemistry Recent Labs  Lab 03/29/18 0425 03/29/18 1609 03/29/18 1613 03/30/18 0635 03/31/18 0310  NA 137 138  --  136 136  K 4.0 3.9  --  3.5 3.7  CL 110 105  --  107 104  CO2 19*  --   --  24 25  GLUCOSE 122* 120*  --  110* 106*  BUN 9 9  --  13 14  CREATININE 0.72 0.70 0.80 0.90 0.93  CALCIUM 8.1*  --   --  8.7* 8.5*  GFRNONAA >60  --  >60 >60 >60  GFRAA >60  --  >60 >60 >60  ANIONGAP 8  --   --  5 7     Hematology Recent Labs  Lab 03/29/18 1740 03/30/18 0635 03/31/18 0310  WBC 13.0* 13.0* 12.0*  RBC  2.86* 2.81* 2.81*  HGB 8.6* 8.4* 8.3*  HCT 26.8* 26.3* 25.9*  MCV 93.7 93.6 92.2  MCH 30.1 29.9 29.5  MCHC 32.1 31.9 32.0  RDW 14.5 14.3 14.6  PLT 98* 101* 93*    Cardiac EnzymesNo results for input(s): TROPONINI in the last 168 hours. No results for input(s): TROPIPOC in the last 168 hours.   BNPNo results for input(s): BNP, PROBNP in the last 168 hours.   DDimer No results for input(s): DDIMER in the last 168 hours.   Radiology    Dg Chest 2 View  Result Date: 04/01/2018 CLINICAL DATA:  Chest pain EXAM: CHEST - 2 VIEW COMPARISON:  03/30/2018 FINDINGS: Cardiac shadow is stable. Postsurgical changes are again seen. Aortic calcifications are again noted and stable. Right-sided chest tube has been removed in the interval. Stable tiny right apical pneumothorax is noted unchanged from the previous exam. Some increased atelectatic changes are noted in the bases bilaterally slightly increased on  the right when compared with the prior study. No new focal abnormality is seen. Left jugular sheath has been removed. IMPRESSION: Bibasilar atelectasis slightly increased in the right base when compared with the prior exam. Interval chest tube removal without significant change in tiny apical pneumothorax. Electronically Signed   By: Inez Catalina M.D.   On: 04/01/2018 09:21    Patient Profile     67 y.o. female with past medical history of rheumatic mitral stenosis, permanent atrial fibrillation admitted with progressive dyspnea.  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 TEE 11/22 EF 55-60%.  Assessment & Plan    1. Minimally invasive MV replacement with pericardial valve - progressing post-operatively. I will review with MD whether aspirin should be considered as adjunct to Coumadin. Per up to date, "For patients with a surgically placed bioprosthetic aortic or mitral valve, we suggest aspirin 75 to 100 mg/day plus a VKA to achieve an INR of 2.5 for the first three to six months (grade 2C)." May need to let surgical team decide. Long term she will be on Coumadin thereafter due h/o afib.  2. H/o permanent atrial fib - tele yesterday showed what appeared to be some AV dissociation; appears to be in coarse AF vs flutter today. Dr. Stanford Breed had suggested she was high likelihood to revert back to AF in light of hx. On coumadin, INR therapeutic. Will obtain repeat EKG today to document atrial arrhythmia. Baseline rate is rather slow. Not currently on any standing AVN blocking agents. Home diltiazem has been held.  3. Hypothyroidism - she reports thyroid med was adjusted a week before surgery. Given tendency towards slower HR (may be surgical related), repeat TSH in AM to trend.   I arranged f/u 12/10 in our office with [name]  PA-C.  For questions or updates, please contact [place] Please consult [email or web address] for contact info under Cardiology/STEMI.  Signed, Charlie Pitter, PA-C 04/01/2018, 2:39 PM    Attending Note:   The patient was seen and examined.  Agree with assessment and plan as noted above.  Changes made to the above note as needed.  Patient seen and independently examined with  Melina Copa, PA .   We discussed all aspects of the encounter. I agree with the assessment and plan as stated above.  1.   MV replacemnt - bioprosthetic.   Will let surgery team decide on  adding ASA 81 mg a day in addition to coumadin Overall doing well  2.    Atrial fib:  Is on coumadin .    HR is slow     I have spent a total of 40 minutes with patient reviewing hospital  notes , telemetry, EKGs, labs and examining patient as well as establishing an assessment and plan that was discussed with the patient. > 50% of time was spent in direct patient care.    Thayer Headings, Brooke Bonito., MD, [facility] 04/01/2018, 3:11 PM [address],  [place] Pager [phone]

## 2018-04-02 ENCOUNTER — Telehealth: Payer: Self-pay | Admitting: *Deleted

## 2018-04-02 LAB — BASIC METABOLIC PANEL
Anion gap: 7 (ref 5–15)
BUN: 12 mg/dL (ref 8–23)
CHLORIDE: 100 mmol/L (ref 98–111)
CO2: 30 mmol/L (ref 22–32)
CREATININE: 0.96 mg/dL (ref 0.44–1.00)
Calcium: 8.4 mg/dL — ABNORMAL LOW (ref 8.9–10.3)
GFR calc non Af Amer: >60 mL/min (ref >60)
Glucose, Bld: 87 mg/dL (ref 70–99)
POTASSIUM: 3.2 mmol/L — AB (ref 3.5–5.1)
SODIUM: 137 mmol/L (ref 135–145)

## 2018-04-02 LAB — CBC
HCT: 26.5 % — ABNORMAL LOW (ref 36.0–46.0)
HEMOGLOBIN: 8.3 g/dL — AB (ref 12.0–15.0)
MCH: 28.8 pg (ref 26.0–34.0)
MCHC: 31.3 g/dL (ref 30.0–36.0)
MCV: 92 fL (ref 80.0–100.0)
Platelets: 165 10*3/uL (ref 150–400)
RBC: 2.88 MIL/uL — AB (ref 3.87–5.11)
RDW: 14.3 % (ref 11.5–15.5)
WBC: 9.3 10*3/uL (ref 4.0–10.5)
nRBC: 2.5 % — ABNORMAL HIGH (ref 0.0–0.2)

## 2018-04-02 LAB — PROTIME-INR
INR: 2.57
Prothrombin Time: 27.2 seconds — ABNORMAL HIGH (ref 11.4–15.2)

## 2018-04-02 LAB — TSH: TSH: 6.655 u[IU]/mL — AB (ref 0.350–4.500)

## 2018-04-02 MED ORDER — FUROSEMIDE 40 MG PO TABS: 40.0000 mg | ORAL_TABLET | Freq: Every day | ORAL | 1 refills | Status: DC

## 2018-04-02 MED ORDER — OXYCODONE HCL 5 MG PO TABS: 5.0000 mg | ORAL_TABLET | Freq: Four times a day (QID) | ORAL | 0 refills | Status: AC | PRN

## 2018-04-02 MED ORDER — ASPIRIN 81 MG PO CHEW: 81.0000 mg | CHEWABLE_TABLET | Freq: Every day | ORAL | Status: DC

## 2018-04-02 MED ORDER — POTASSIUM CHLORIDE CRYS ER 20 MEQ PO TBCR: 40.0000 meq | EXTENDED_RELEASE_TABLET | Freq: Once | ORAL | Status: DC

## 2018-04-02 MED ORDER — LEVOTHYROXINE SODIUM 125 MCG PO TABS: 125.0000 ug | ORAL_TABLET | Freq: Every day | ORAL | 1 refills | Status: DC

## 2018-04-02 MED ORDER — WARFARIN SODIUM 2 MG PO TABS: 2.0000 mg | ORAL_TABLET | Freq: Every day | ORAL | Status: DC

## 2018-04-02 MED ORDER — POTASSIUM CHLORIDE 20 MEQ PO PACK
40.0000 meq | PACK | Freq: Once | ORAL | Status: AC
  Administered 2018-04-02: 40 meq via ORAL
  Filled 2018-04-02: qty 2

## 2018-04-02 MED ORDER — LEVOTHYROXINE SODIUM 150 MCG PO TABS: 150.0000 ug | ORAL_TABLET | Freq: Every day | ORAL | 1 refills | Status: DC

## 2018-04-02 MED ORDER — WARFARIN SODIUM 1 MG PO TABS: 1.0000 mg | ORAL_TABLET | Freq: Every day | ORAL | 1 refills | Status: DC

## 2018-04-02 MED ORDER — LEVOTHYROXINE SODIUM 25 MCG PO TABS: 125.0000 ug | ORAL_TABLET | Freq: Every day | ORAL | Status: DC

## 2018-04-02 MED ORDER — POTASSIUM CHLORIDE 20 MEQ PO PACK: 20.0000 meq | PACK | Freq: Every day | ORAL | 1 refills | Status: DC

## 2018-04-02 MED ORDER — LEVOTHYROXINE SODIUM 75 MCG PO TABS: 150.0000 ug | ORAL_TABLET | Freq: Every day | ORAL | Status: DC

## 2018-04-02 NOTE — Progress Notes (Addendum)
[address]       [place] [address]             [phone]      5 Days Post-Op Procedure(s) (LRB): MINIMALLY INVASIVE MITRAL VALVE (MV) REPLACEMENT. Magna Mitral Ease 66mm. (Right) MINIMALLY INVASIVE MAZE PROCEDURE (N/A) TRANSESOPHAGEAL ECHOCARDIOGRAM (TEE) (N/A) Subjective: Feels well  Objective: Vital signs in last 24 hours: Temp:  [97.8 F (36.6 C)-98.1 F (36.7 C)] 97.8 F (36.6 C) (11/27 0350) Pulse Rate:  [50-56] 50 (11/27 0350) Cardiac Rhythm: Atrial fibrillation (11/27 0700) Resp:  [11-22] 19 (11/27 0350) BP: (120-128)/(50-65) 120/55 (11/27 0350) SpO2:  [95 %-96 %] 96 % (11/27 0350) Weight:  [77.5 kg] 77.5 kg (11/27 0500)  Hemodynamic parameters for last 24 hours:    Intake/Output from previous day: No intake/output data recorded. Intake/Output this shift: No intake/output data recorded.  General appearance: alert, cooperative and no distress Heart: regular rate and rhythm and soft systolic murmur Lungs: clear to auscultation bilaterally Abdomen: benign Extremities: no sign edema Wound: incis healing well  Lab Results: Recent Labs    03/31/18 0310 04/02/18 0345  WBC 12.0* 9.3  HGB 8.3* 8.3*  HCT 25.9* 26.5*  PLT 93* 165   BMET:  Recent Labs    03/31/18 0310 04/02/18 0345  NA 136 137  K 3.7 3.2*  CL 104 100  CO2 25 30  GLUCOSE 106* 87  BUN 14 12  CREATININE 0.93 0.96  CALCIUM 8.5* 8.4*    PT/INR:  Recent Labs    04/02/18 0345  LABPROT 27.2*  INR 2.57   ABG    Component Value Date/Time   PHART 7.302 (L) 03/28/2018 2222   HCO3 20.1 03/28/2018 2222   TCO2 24 03/29/2018 1609   ACIDBASEDEF 6.0 (H) 03/28/2018 2222   O2SAT 60.6 03/29/2018 0450   CBG (last 3)  No results for input(s): GLUCAP in the last 72 hours.  Meds Scheduled Meds: . acetaminophen  1,000 mg Oral Q6H  . bisacodyl  10 mg Oral Daily   Or  . bisacodyl  10 mg Rectal Daily  . buPROPion  300 mg Oral Daily  . docusate sodium  200 mg Oral  Daily  . ferrous [id number] C-folic acid  1 capsule Oral TID PC  . furosemide  40 mg Oral BID  . levothyroxine  112 mcg Oral QAC breakfast  . mouth rinse  15 mL Mouth Rinse BID  . pantoprazole  40 mg Oral Daily  . potassium chloride  20 mEq Oral BID  . potassium chloride  40 mEq Oral Once  . warfarin  2 mg Oral q1800  . Warfarin - Physician Dosing Inpatient   Does not apply q1800   Continuous Infusions: . sodium chloride Stopped (03/30/18 0640)   PRN Meds:.metoprolol tartrate, morphine injection, ondansetron (ZOFRAN) IV, oxyCODONE, traMADol  Xrays Dg Chest 2 View  Result Date: 04/01/2018 CLINICAL DATA:  Chest pain EXAM: CHEST - 2 VIEW COMPARISON:  03/30/2018 FINDINGS: Cardiac shadow is stable. Postsurgical changes are again seen. Aortic calcifications are again noted and stable. Right-sided chest tube has been removed in the interval. Stable tiny right apical pneumothorax is noted unchanged from the previous exam. Some increased atelectatic changes are noted in the bases bilaterally slightly increased on the right when compared with the prior study. No new focal abnormality is seen. Left jugular sheath has been removed. IMPRESSION: Bibasilar atelectasis slightly increased in the right base when compared with the prior exam. Interval chest tube  removal without significant change in tiny apical pneumothorax. Electronically Signed   By: Inez Catalina M.D.   On: 04/01/2018 09:21    Assessment/Plan: S/P Procedure(s) (LRB): MINIMALLY INVASIVE MITRAL VALVE (MV) REPLACEMENT. Magna Mitral Ease 68mm. (Right) MINIMALLY INVASIVE MAZE PROCEDURE (N/A) TRANSESOPHAGEAL ECHOCARDIOGRAM (TEE) (N/A)  1 doing better today, feels well 2 sinus brady with some afib, also slow, BP well controlled 3  sats good on RA 4 TSH elevated at 6.65, will increase synthroid dose 5 INR 2.57 , will d/c on 1 mg coumadin , add low dose ASA 6 stable for discharge   LOS: 5 days    Lorraine Kemp  [facility] 04/02/2018 Pager [phone]  I have seen and examined the patient and agree with the assessment and plan as outlined.  Doing well.  D/C home today  Rexene Alberts, MD 04/02/2018 8:40 AM

## 2018-04-02 NOTE — Progress Notes (Cosign Needed Addendum)
   CARDIOLOGY RECOMMENDATIONS:  Discharge is anticipated. Patient feels great this AM and feels ready to go.  Discharge Medications: Continue medications as they are currently listed in med rec, except did speak with APP re: ASA and levothyroxine. Notes indicate plan to add baby aspirin but did not see included in DC meds. This is typically continued 3-6 months after bioprosthetic valve placement. Regarding elevated TSH, patient shares this has been a moving target recently. She was on 166mcg for a while, then 170mcg the last 4-5 months, then 2 weeks ago it was checked and she was lowered to 162mcg. So It's not very clear where her TSH will fall. She may require creative dosing with 142mcg some days of the week and 159mcg others, but for now would suggest to move back up to 125 (instead of 150) as outlined in chart and this can be followed as outpatient. The patient already has 182mcg tabs at home. I spoke with patient's outpatient pharmacy to make them aware. Patrick Jupiter has made these changes in med rec and AVS. Nurse aware to reprint AVS.  Follow Up: The patient's Primary Cardiologist is Kirk Ruths, MD   Follow up has been scheduled for INR check 12/3, and appointment with Kerin Ransom PA-C on 12/10. Information included on AVS.  Signed,  Charlie Pitter, PA-C  11:07 AM 04/02/2018  [facility]

## 2018-04-02 NOTE — Progress Notes (Signed)
Patient is discharged per MD orders, discharge education and follow up care explained to patient and her husband. Patient and patient's husband Richardson Landry verbally stated understanding of discharge instructions. Telemetry and peripheral IV were removed. Patient is discharged home with all personal belongings.

## 2018-04-02 NOTE — Progress Notes (Signed)
CARDIAC REHAB PHASE I   Pt states she has been ambulating in the hallways without difficulty. Pt denies dizziness, SOB, or pain. D/c ed completed with pt and family. Pt educated to shower and monitor incisions daily. Reviewed restrictions and exercise guidelines. Will refer to CRP II [place].   [id number] Rufina Falco, RN BSN 04/02/2018 9:16 AM

## 2018-04-02 NOTE — Plan of Care (Signed)

## 2018-04-02 NOTE — Telephone Encounter (Signed)
-----   Message from Charlie Pitter, [place] sent at 04/01/2018  2:53 PM EST ----- Regarding: Pt will need TOC phone call after DC She will probably be discharged in the next 1-2 days. I already scheduled f/u for her with [facility] on 12/10, just will need the [place] call. Of note, I am also cc'ing to Ivin Booty - she recently reached out to clarify these requests go to [facility] clinical pool but there is no such pool by that name anymore. Is this the right pool? There is also one called [facility] TOC as well but I've never been told about that one from anyone before. Thank you for all you do!

## 2018-04-04 MED FILL — Sodium Bicarbonate IV Soln 8.4%: INTRAVENOUS | Qty: 50 | Status: AC

## 2018-04-04 MED FILL — Electrolyte-R (PH 7.4) Solution: INTRAVENOUS | Qty: 4000 | Status: AC

## 2018-04-04 MED FILL — Heparin Sodium (Porcine) Inj 1000 Unit/ML: INTRAMUSCULAR | Qty: 20 | Status: AC

## 2018-04-04 MED FILL — Sodium Chloride IV Soln 0.9%: INTRAVENOUS | Qty: 2000 | Status: AC

## 2018-04-04 MED FILL — Mannitol IV Soln 20%: INTRAVENOUS | Qty: 1000 | Status: AC

## 2018-04-04 MED FILL — Lidocaine HCl(Cardiac) IV PF Soln Pref Syr 100 MG/5ML (2%): INTRAVENOUS | Qty: 25 | Status: AC

## 2018-04-07 NOTE — Telephone Encounter (Signed)
Patient contacted regarding discharge from [facility] on 04/02/18.  Patient understands to follow up with provider Kerin Ransom PA on 04/15/18 at 8:30 at [facility]. Patient understands discharge instructions? yes Patient understands medications and regiment? yes Patient understands to bring all medications to this visit? yes

## 2018-04-08 ENCOUNTER — Ambulatory Visit (INDEPENDENT_AMBULATORY_CARE_PROVIDER_SITE_OTHER): Payer: Medicare HMO | Admitting: Pharmacist Clinician (PhC)/ Clinical Pharmacy Specialist

## 2018-04-08 ENCOUNTER — Ambulatory Visit (INDEPENDENT_AMBULATORY_CARE_PROVIDER_SITE_OTHER): Payer: Self-pay | Admitting: *Deleted

## 2018-04-08 DIAGNOSIS — I4891 Unspecified atrial fibrillation: Secondary | ICD-10-CM | POA: Diagnosis not present

## 2018-04-08 DIAGNOSIS — I050 Rheumatic mitral stenosis: Secondary | ICD-10-CM

## 2018-04-08 DIAGNOSIS — Z4802 Encounter for removal of sutures: Secondary | ICD-10-CM

## 2018-04-08 DIAGNOSIS — I4821 Permanent atrial fibrillation: Secondary | ICD-10-CM

## 2018-04-08 DIAGNOSIS — I080 Rheumatic disorders of both mitral and aortic valves: Secondary | ICD-10-CM

## 2018-04-08 DIAGNOSIS — Z7901 Long term (current) use of anticoagulants: Secondary | ICD-10-CM | POA: Diagnosis not present

## 2018-04-08 DIAGNOSIS — Z953 Presence of xenogenic heart valve: Secondary | ICD-10-CM

## 2018-04-08 LAB — POCT INR: INR: 1.2 — AB (ref 2.0–3.0)

## 2018-04-08 NOTE — Progress Notes (Signed)
Lorraine Kemp returns s/p MINI MVR/MAZE on 03/28/18 and discharged on 04/02/18.  She is doing well at home with diet and her bowels are slowly returning to normal, although she has always had an issue she says.  Her anterior MINI chest incision is healing well with residual glue intact.  The two chest tube sites are well healed and the sutures were easily removed. She is having her INR monitored, but it is not yet stabilized to the desired level. She will return as scheduled with a CXR.

## 2018-04-08 NOTE — Patient Instructions (Signed)
Description   Take 7.5 mg for 2 days (Tuesday Dec 3 and Wednesday Dec 4) then 5 mg on Thursday Dec 5.  After that resume your normal dose of 2.5 mg daily except 5 mg each Monday, Wednesday and Friday.  Repeat INR in 1 weeks.  Continue enoxaparin injections twice daily for 2 days.

## 2018-04-11 ENCOUNTER — Ambulatory Visit (INDEPENDENT_AMBULATORY_CARE_PROVIDER_SITE_OTHER): Payer: Medicare HMO | Admitting: Pharmacist Clinician (PhC)/ Clinical Pharmacy Specialist

## 2018-04-11 DIAGNOSIS — I4891 Unspecified atrial fibrillation: Secondary | ICD-10-CM

## 2018-04-11 DIAGNOSIS — Z7901 Long term (current) use of anticoagulants: Secondary | ICD-10-CM | POA: Diagnosis not present

## 2018-04-11 LAB — POCT INR: INR: 3.1 — AB (ref 2.0–3.0)

## 2018-04-11 NOTE — Patient Instructions (Signed)
Description   Take just 2.5 mg today Friday Dec 6, then continue with  2.5 mg daily except 5 mg each Monday, Wednesday and Friday.  Repeat INR in 1 weeks.

## 2018-04-15 ENCOUNTER — Ambulatory Visit (INDEPENDENT_AMBULATORY_CARE_PROVIDER_SITE_OTHER): Payer: Medicare HMO | Admitting: Pharmacist Clinician (PhC)/ Clinical Pharmacy Specialist

## 2018-04-15 ENCOUNTER — Ambulatory Visit: Payer: Medicare HMO | Admitting: Cardiology

## 2018-04-15 ENCOUNTER — Encounter: Payer: Self-pay | Admitting: Cardiology

## 2018-04-15 DIAGNOSIS — Z7901 Long term (current) use of anticoagulants: Secondary | ICD-10-CM

## 2018-04-15 DIAGNOSIS — I4811 Longstanding persistent atrial fibrillation: Secondary | ICD-10-CM | POA: Diagnosis not present

## 2018-04-15 DIAGNOSIS — Z8679 Personal history of other diseases of the circulatory system: Secondary | ICD-10-CM

## 2018-04-15 DIAGNOSIS — Z9889 Other specified postprocedural states: Secondary | ICD-10-CM

## 2018-04-15 DIAGNOSIS — Z953 Presence of xenogenic heart valve: Secondary | ICD-10-CM

## 2018-04-15 DIAGNOSIS — I4891 Unspecified atrial fibrillation: Secondary | ICD-10-CM

## 2018-04-15 LAB — POCT INR: INR: 4 — AB (ref 2.0–3.0)

## 2018-04-15 NOTE — Assessment & Plan Note (Addendum)
CAF- she had been on Diltiazem 240 prior to admission. EKG today shows regular narrow complex QRS- it looks to be NSR with long 1st degree AVB

## 2018-04-15 NOTE — Patient Instructions (Signed)
Medication Instructions:  Your physician recommends that you continue on your current medications as directed. Please refer to the Current Medication list given to you today. If you need a refill on your cardiac medications before your next appointment, please call your pharmacy.   Lab work: NONE  If you have labs (blood work) drawn today and your tests are completely normal, you will receive your results only by: Marland Kitchen MyChart Message (if you have MyChart) OR . A paper copy in the mail If you have any lab test that is abnormal or we need to change your treatment, we will call you to review the results.  Testing/Procedures: NONE   Follow-Up: At [facility], you and your health needs are our priority.  As part of our continuing mission to provide you with exceptional heart care, we have created designated Provider Care Teams.  These Care Teams include your primary Cardiologist (physician) and Advanced Practice Providers (APPs -  Physician Assistants and Nurse Practitioners) who all work together to provide you with the care you need, when you need it. . Your physician recommends that you schedule a follow-up appointment in: 2 [place].  Any Other Special Instructions Will Be Listed Below (If Applicable).

## 2018-04-15 NOTE — Assessment & Plan Note (Deleted)
CAF- she had been on Diltiazem 240 prior to admission

## 2018-04-15 NOTE — Assessment & Plan Note (Signed)
31 mm [facility] Mitral stented bovine pericardial tissue valve

## 2018-04-15 NOTE — Progress Notes (Signed)
04/15/2018  Lorraine Kemp   1950/05/29  [id number]  Primary Physician Chesley Noon, MD Primary Cardiologist: Dr Stanford Breed  HPI: Ms. Lorraine Kemp is a pleasant 67 year old female followed by Dr. Stanford Breed.  She has a history of balloon mitral valvuloplasty in 2000 and [facility].  She has had chronic atrial fibrillation.  She is been on chronic Coumadin.  She has had progression of disease.  She was referred back to [facility] but it was felt mitral valve replacement with serve her best.  On March 28, 2018 she underwent minimally invasive mitral valve replacement and an ACE.  She seen in the office today for follow-up.  Since discharge she is been doing well, she is having some trouble sleeping and some mild anorexia.  She is unaware of her heart rate.  She has not had dyspnea.   Current Outpatient Medications  Medication Sig Dispense Refill  . amoxicillin (AMOXIL) 500 MG capsule Take 2,000 mg by mouth See admin instructions. Take 2000 mg by mouth 1 hour prior to dental appointment  2  . aspirin 81 MG chewable tablet Chew 1 tablet (81 mg total) by mouth daily.    Marland Kitchen buPROPion (WELLBUTRIN XL) 300 MG 24 hr tablet TAKE 1 TABLET DAILY (Patient taking differently: Take 300 mg by mouth daily. ) 90 tablet 2  . furosemide (LASIX) 40 MG tablet Take 1 tablet (40 mg total) by mouth daily. 30 tablet 1  . levothyroxine (SYNTHROID, LEVOTHROID) 125 MCG tablet Take 1 tablet (125 mcg total) by mouth daily before breakfast. 30 tablet 1  . Multiple Minerals-Vitamins (CALCIUM-MAGNESIUM-ZINC-D3 PO) Take 1 tablet by mouth daily.    . Multiple Vitamins-Minerals (MULTIVITAMIN WITH MINERALS) tablet Take 1 tablet by mouth daily.     . Omega-3 Fatty Acids (FISH OIL) 1000 MG CAPS Take 1,000 mg by mouth daily.     . potassium chloride (KLOR-CON) 20 MEQ packet Take 20 mEq by mouth daily. 30 tablet 1  . warfarin (COUMADIN) 1 MG tablet Take 1 tablet (1 mg total) by mouth daily at 6 PM. As directed by the coumadin clinic 100 tablet 1    No current facility-administered medications for this visit.     No Known Allergies  Past Medical History:  Diagnosis Date  . Atrial fibrillation ([place])   . Chronic diastolic congestive heart failure ([place])   . Complication of anesthesia   . Depression   . Hematuria    workup by Dr. Diona Fanti  . Longstanding persistent atrial fibrillation   . MITRAL REGURGITATION   . Mitral stenosis   . PONV (postoperative nausea and vomiting)   . RHEUMATIC HEART DISEASE   . S/P Minimally invasive maze operation for atrial fibrillation 03/28/2018   Complete bilateral atrial lesion set using crythermy and bipolar radiofrequency ablation with clipping of LA appendage via right mini thoracotomy  . S/P minimally invasive mitral valve replacement with bioprosthetic valve 03/28/2018   31 mm [facility] Mitral stented bovine pericardial tissue valve   . Thyroid nodule   . Wrist fracture, left 2019   Dr. Sharol Given    Social History   Socioeconomic History  . Marital status: Married    Spouse name: Not on file  . Number of children: 2  . Years of education: Not on file  . Highest education level: Not on file  Occupational History  . Occupation: Airline pilot    Employer: [employer]  Social Needs  . Financial resource strain: Not on file  . Food insecurity:  Worry: Not on file    Inability: Not on file  . Transportation needs:    Medical: Not on file    Non-medical: Not on file  Tobacco Use  . Smoking status: Never Smoker  . Smokeless tobacco: Never Used  Substance and Sexual Activity  . Alcohol use: Yes    Alcohol/week: 1.0 standard drinks    Types: 1 Glasses of wine per week    Comment: occas glass of wine weekly  . Drug use: No  . Sexual activity: Yes    Partners: Male    Birth control/protection: Surgical, Post-menopausal    Comment: BTL  Lifestyle  . Physical activity:    Days per week: Not on file    Minutes per session: Not on file  . Stress: Not on file   Relationships  . Social connections:    Talks on phone: Not on file    Gets together: Not on file    Attends religious service: Not on file    Active member of club or organization: Not on file    Attends meetings of clubs or organizations: Not on file    Relationship status: Not on file  . Intimate partner violence:    Fear of current or ex partner: Not on file    Emotionally abused: Not on file    Physically abused: Not on file    Forced sexual activity: Not on file  Other Topics Concern  . Not on file  Social History Narrative  . Not on file     Family History  Problem Relation Age of Onset  . Lung cancer Father   . Cancer Father        lung  . Heart disease Mother        S/P CABG  . Cancer Brother        brain  . Hypertension Sister   . Heart disease Maternal Grandmother      Review of Systems: General: negative for chills, fever, night sweats or weight changes.  Cardiovascular: negative for chest pain, dyspnea on exertion, edema, orthopnea, palpitations, paroxysmal nocturnal dyspnea or shortness of breath Dermatological: negative for rash Respiratory: negative for cough or wheezing Urologic: negative for hematuria Abdominal: negative for nausea, vomiting, diarrhea, bright red blood per rectum, melena, or hematemesis Neurologic: negative for visual changes, syncope, or dizziness All other systems reviewed and are otherwise negative except as noted above.    Blood pressure 116/68, pulse 95, height 5\' 5"  (1.651 m), weight 163 lb (73.9 kg), last menstrual period 05/07/2002, SpO2 100 %.  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 EKG today shows regular narrow complex QRS- it looks to be NSR with long 1st degree AVB  Long term (current) use of anticoagulants Coumadin and ASA 81 mg per Dr Roxy Manns post MVR   PLAN  Same Rx for now- Diltiazem may need  to be added in the future for HR if she goes back into AF. Follow up with dr Roxy Manns Next week.  F/U Dr Stanford Breed in 2 months.  Kerin Ransom PA-C 04/15/2018 9:11 AM

## 2018-04-15 NOTE — Assessment & Plan Note (Addendum)
Coumadin and ASA 81 mg per Dr Roxy Manns post MVR

## 2018-04-15 NOTE — Assessment & Plan Note (Signed)
Complete bilateral atrial lesion set using crythermy and bipolar radiofrequency ablation with clipping of LA appendage via right mini thoracotomy

## 2018-04-18 ENCOUNTER — Other Ambulatory Visit: Payer: Self-pay | Admitting: Thoracic Surgery (Cardiothoracic Vascular Surgery)

## 2018-04-18 DIAGNOSIS — Z8679 Personal history of other diseases of the circulatory system: Secondary | ICD-10-CM

## 2018-04-18 DIAGNOSIS — Z9889 Other specified postprocedural states: Principal | ICD-10-CM

## 2018-04-21 ENCOUNTER — Encounter: Payer: Self-pay | Admitting: Thoracic Surgery (Cardiothoracic Vascular Surgery)

## 2018-04-21 ENCOUNTER — Ambulatory Visit (INDEPENDENT_AMBULATORY_CARE_PROVIDER_SITE_OTHER): Payer: Self-pay | Admitting: Thoracic Surgery (Cardiothoracic Vascular Surgery)

## 2018-04-21 ENCOUNTER — Ambulatory Visit
Admission: RE | Admit: 2018-04-21 | Discharge: 2018-04-21 | Disposition: A | Discharge status: Home / Self Care | Payer: Medicare HMO | Source: Ambulatory Visit | Attending: Thoracic Surgery (Cardiothoracic Vascular Surgery) | Admitting: Thoracic Surgery (Cardiothoracic Vascular Surgery)

## 2018-04-21 ENCOUNTER — Ambulatory Visit (INDEPENDENT_AMBULATORY_CARE_PROVIDER_SITE_OTHER): Payer: Medicare HMO | Admitting: Pharmacist

## 2018-04-21 VITALS — BP 124/67 | HR 92 | Resp 20 | Ht 65.0 in | Wt 162.0 lb

## 2018-04-21 DIAGNOSIS — Z8679 Personal history of other diseases of the circulatory system: Secondary | ICD-10-CM

## 2018-04-21 DIAGNOSIS — Z9889 Other specified postprocedural states: Secondary | ICD-10-CM

## 2018-04-21 DIAGNOSIS — I4891 Unspecified atrial fibrillation: Secondary | ICD-10-CM

## 2018-04-21 DIAGNOSIS — Z7901 Long term (current) use of anticoagulants: Secondary | ICD-10-CM | POA: Diagnosis not present

## 2018-04-21 DIAGNOSIS — Z953 Presence of xenogenic heart valve: Secondary | ICD-10-CM

## 2018-04-21 DIAGNOSIS — I080 Rheumatic disorders of both mitral and aortic valves: Secondary | ICD-10-CM

## 2018-04-21 LAB — POCT INR: INR: 3.1 — AB (ref 2.0–3.0)

## 2018-04-21 MED ORDER — FUROSEMIDE 40 MG PO TABS: 20.0000 mg | ORAL_TABLET | Freq: Every day | ORAL | 1 refills | Status: DC

## 2018-04-21 MED ORDER — POTASSIUM CHLORIDE 20 MEQ PO PACK: 10.0000 meq | PACK | Freq: Every day | ORAL | 1 refills | Status: DC

## 2018-04-21 NOTE — Patient Instructions (Addendum)
Decrease your dose of lasix to 20 mg daily and decrease potassium to 10 mEq daily  Continue all other previous medications without any changes at this time  Check your weight on a regular basis and keep a log for your records.  Look for signs of fluid overload such as worsening swelling of your lower legs, increased shortness of breath with activity, and/or a dry nonproductive cough.  Discussed these findings with your cardiologist including whether or not you should adjust your fluid pill dosage (diuretic).  You may continue to gradually increase your physical activity as tolerated.  Refrain from any heavy lifting or strenuous use of your arms and shoulders until at least 8 weeks from the time of your surgery, and avoid activities that cause increased pain in your chest on the side of your surgical incision.  Otherwise you may continue to increase activities without any particular limitations.  Increase the intensity and duration of physical activity gradually.  You may return to driving an automobile as long as you are no longer requiring oral narcotic pain relievers during the daytime.  It would be wise to start driving only short distances during the daylight and gradually increase from there as you feel comfortable.  You are encouraged to enroll and participate in the outpatient cardiac rehab program beginning as soon as practical.  Endocarditis is a potentially serious infection of heart valves or inside lining of the heart.  It occurs more commonly in patients with diseased heart valves (such as patient's with aortic or mitral valve disease) and in patients who have undergone heart valve repair or replacement.  Certain surgical and dental procedures may put you at risk, such as dental cleaning, other dental procedures, or any surgery involving the respiratory, urinary, gastrointestinal tract, gallbladder or prostate gland.   To minimize your chances for develooping endocarditis, maintain good  oral health and seek prompt medical attention for any infections involving the mouth, teeth, gums, skin or urinary tract.    Always notify your doctor or dentist about your underlying heart valve condition before having any invasive procedures. You will need to take antibiotics before certain procedures, including all routine dental cleanings or other dental procedures.  Your cardiologist or dentist should prescribe these antibiotics for you to be taken ahead of time.

## 2018-04-21 NOTE — Addendum Note (Signed)
Addended by: Charlena Cross F on: 04/21/2018 02:59 PM   Modules accepted: Orders

## 2018-04-21 NOTE — Progress Notes (Signed)
[address]       [place] [address]             [phone]     CARDIOTHORACIC SURGERY OFFICE NOTE  Referring Provider is Stanford Breed, Denice Bors, MD PCP is Chesley Noon, MD   HPI:  [name] with history of rheumatic heart disease who returns to the office today for routine follow-up status post minimally invasive mitral valve replacement using the stented bovine pericardial tissue valve and minimally invasive maze procedure on March 28, 2018 for severe mitral stenosis and longstanding persistent atrial fibrillation.  The patient's early postoperative recovery in the hospital was uneventful.  She had a few episodes of intermittent rate controlled atrial fibrillation but was discharged home in sinus rhythm on the fifth postoperative day.  Since hospital discharge she has done well.  Her prothrombin time was checked earlier this morning and notably therapeutic at 3.1.  She was seen in follow-up by Kerin Ransom at [facility] last week and doing well and maintaining sinus rhythm at that time.  No changes in her medications were made.  She returns her office today and reports that she is doing very well.  She has minimal residual soreness in her chest for which she only occasionally will take Tylenol.  She really has had very little pain.  She has no shortness of breath and notes that her breathing is already noticeably better than it was prior to surgery.  She has not had any palpitations.  Appetite is slowly improving.  Her weight gradually decreased after hospital discharge and seems to have reached a plateau close to her preoperative baseline.  Overall she is pleased with her progress.   Current Outpatient Medications  Medication Sig Dispense Refill  . amoxicillin (AMOXIL) 500 MG capsule Take 2,000 mg by mouth See admin instructions. Take 2000 mg by mouth 1 hour prior to dental appointment  2  . aspirin 81 MG chewable tablet Chew 1 tablet (81  mg total) by mouth daily.    Marland Kitchen buPROPion (WELLBUTRIN XL) 300 MG 24 hr tablet TAKE 1 TABLET DAILY (Patient taking differently: Take 300 mg by mouth daily. ) 90 tablet 2  . furosemide (LASIX) 40 MG tablet Take 1 tablet (40 mg total) by mouth daily. 30 tablet 1  . levothyroxine (SYNTHROID, LEVOTHROID) 125 MCG tablet Take 1 tablet (125 mcg total) by mouth daily before breakfast. 30 tablet 1  . Multiple Minerals-Vitamins (CALCIUM-MAGNESIUM-ZINC-D3 PO) Take 1 tablet by mouth daily.    . Multiple Vitamins-Minerals (MULTIVITAMIN WITH MINERALS) tablet Take 1 tablet by mouth daily.     . Omega-3 Fatty Acids (FISH OIL) 1000 MG CAPS Take 1,000 mg by mouth daily.     . potassium chloride (KLOR-CON) 20 MEQ packet Take 20 mEq by mouth daily. 30 tablet 1  . warfarin (COUMADIN) 1 MG tablet Take 1 tablet (1 mg total) by mouth daily at 6 PM. As directed by the coumadin clinic 100 tablet 1   No current facility-administered medications for this visit.       Physical Exam:   BP 124/67   Pulse 92   Resp 20   Ht 5\' 5"  (1.651 m)   Wt 162 lb (73.5 kg)   LMP 05/07/2002 (Approximate)   SpO2 97% Comment: RA  BMI 26.96 kg/m   General:  Well-appearing  Chest:   Clear to auscultation with symmetrical breath sounds  CV:   Regular rate and rhythm  without murmur  Incisions:  Healing nicely  Abdomen:  Soft nontender  Extremities:  Warm and well perfused, no edema  Diagnostic Tests:  CHEST - 2 VIEW  COMPARISON:  April 01, 2018  FINDINGS: Mitral valve replacement. The heart, hila, mediastinum, lungs, and pleura are otherwise unremarkable.  IMPRESSION: No active cardiopulmonary disease.   Electronically Signed   By: Dorise Bullion III M.D   On: 04/21/2018 10:11   2 channel telemetry rhythm strip performed in our office demonstrates regular narrow complex rhythm.  No definite P waves are identified so the rhythm is presumably either sinus, ectopic atrial, or  junctional.   Impression:  Patient is doing very well approximately 3 weeks status post minimally invasive mitral valve replacement using a bioprosthetic tissue valve and maze procedure.  She is maintaining rhythm clinically making excellent progress.  Plan:  I have instructed the patient to decrease her dose of Lasix from 40 mg to 20 mg daily.  She has also been instructed to decrease her dose of potassium from 20 to 10 mEq daily.  She will continue to monitor her weight.  We have otherwise not changed any medications.  I encouraged the patient to increase her physical activity as tolerated.  She may resume driving automobile.  She is ready to start outpatient cardiac rehab program.  The patient has been reminded regarding the importance of dental hygiene and the lifelong need for antibiotic prophylaxis for all dental cleanings and other related invasive procedures.  All of her questions have been addressed.  The patient will return to our office in approximately 2 months for routine follow-up and rhythm check.  She will call and return sooner should specific problems or questions arise.    Valentina Gu. Roxy Manns, MD 04/21/2018 10:56 AM

## 2018-04-28 ENCOUNTER — Ambulatory Visit (INDEPENDENT_AMBULATORY_CARE_PROVIDER_SITE_OTHER): Payer: Medicare HMO | Admitting: Pharmacist

## 2018-04-28 DIAGNOSIS — I4891 Unspecified atrial fibrillation: Secondary | ICD-10-CM

## 2018-04-28 DIAGNOSIS — Z7901 Long term (current) use of anticoagulants: Secondary | ICD-10-CM | POA: Diagnosis not present

## 2018-04-28 LAB — PROTIME-INR
INR: 7.4 (ref 0.8–1.2)
Prothrombin Time: 69.7 s — ABNORMAL HIGH (ref 9.1–12.0)

## 2018-04-28 LAB — POCT INR: INR: 7.5 — AB (ref 2.0–3.0)

## 2018-05-02 ENCOUNTER — Ambulatory Visit (INDEPENDENT_AMBULATORY_CARE_PROVIDER_SITE_OTHER): Payer: Medicare HMO | Admitting: Pharmacist

## 2018-05-02 DIAGNOSIS — Z7901 Long term (current) use of anticoagulants: Secondary | ICD-10-CM | POA: Diagnosis not present

## 2018-05-02 DIAGNOSIS — I4891 Unspecified atrial fibrillation: Secondary | ICD-10-CM

## 2018-05-02 LAB — POCT INR: INR: 2.2 (ref 2.0–3.0)

## 2018-05-16 DIAGNOSIS — F330 Major depressive disorder, recurrent, mild: Secondary | ICD-10-CM | POA: Insufficient documentation

## 2018-06-13 NOTE — Progress Notes (Signed)
HPI: FU permanent atrial fibrillation, MS and rheumatic heart disease. She has had prior balloon valvuloplasty at [facility] in June 2000.TEE August 2019 showed normal LV function, biatrial enlargement, mild right ventricular enlargement, severe mitral stenosis with mitral valve area of 1 cm, mild to moderate mitral regurgitation and mild tricuspid regurgitation.  Cardiac catheterization October 2019 showed nonobstructive coronary disease.   Carotid Dopplers November 2019 showed 1 to 39% bilateral stenosis.  Patient had minimally invasive mitral valve replacement with an Edwards magna mitral stented bovine pericardial tissue valve and minimally invasive maze procedure 11/19.  Since I last saw her,her dyspnea on exertion is much improved.  No orthopnea, PND, pedal edema, chest pain or syncope.  She is slowly improving with her energy following her recent surgery.  Current Outpatient Medications  Medication Sig Dispense Refill  . amoxicillin (AMOXIL) 500 MG capsule Take 2,000 mg by mouth See admin instructions. Take 2000 mg by mouth 1 hour prior to dental appointment  2  . aspirin 81 MG chewable tablet Chew 1 tablet (81 mg total) by mouth daily.    Marland Kitchen buPROPion (WELLBUTRIN XL) 300 MG 24 hr tablet TAKE 1 TABLET DAILY (Patient taking differently: Take 300 mg by mouth daily. ) 90 tablet 2  . furosemide (LASIX) 40 MG tablet Take 0.5 tablets (20 mg total) by mouth daily. 30 tablet 1  . levothyroxine (SYNTHROID, LEVOTHROID) 125 MCG tablet Take 1 tablet (125 mcg total) by mouth daily before breakfast. 30 tablet 1  . Multiple Minerals-Vitamins (CALCIUM-MAGNESIUM-ZINC-D3 PO) Take 1 tablet by mouth daily.    . Multiple Vitamins-Minerals (MULTIVITAMIN WITH MINERALS) tablet Take 1 tablet by mouth daily.     . Omega-3 Fatty Acids (FISH OIL) 1000 MG CAPS Take 1,000 mg by mouth daily.     . potassium chloride (KLOR-CON) 20 MEQ packet Take 10 mEq by mouth daily. 30 tablet 1  . warfarin (COUMADIN) 1 MG  tablet Take 1 tablet (1 mg total) by mouth daily at 6 PM. As directed by the coumadin clinic 100 tablet 1   No current facility-administered medications for this visit.      Past Medical History:  Diagnosis Date  . Atrial fibrillation ([place])   . Chronic diastolic congestive heart failure ([place])   . Complication of anesthesia   . Depression   . Hematuria    workup by Dr. Diona Fanti  . Longstanding persistent atrial fibrillation   . MITRAL REGURGITATION   . Mitral stenosis   . PONV (postoperative nausea and vomiting)   . RHEUMATIC HEART DISEASE   . S/P Minimally invasive maze operation for atrial fibrillation 03/28/2018   Complete bilateral atrial lesion set using crythermy and bipolar radiofrequency ablation with clipping of LA appendage via right mini thoracotomy  . S/P minimally invasive mitral valve replacement with bioprosthetic valve 03/28/2018   31 mm [facility] Mitral stented bovine pericardial tissue valve   . Thyroid nodule   . Wrist fracture, left 2019   Dr. Sharol Given    Past Surgical History:  Procedure Laterality Date  . ABDOMINAL AORTOGRAM N/A 03/04/2018   Procedure: ABDOMINAL AORTOGRAM;  Surgeon: Jettie Booze, MD;  Location: [place] [facility];  Service: Cardiovascular;  Laterality: N/A;  . CARDIOVERSION     2000  . CESAREAN SECTION    . COLONOSCOPY    . MINIMALLY INVASIVE MAZE PROCEDURE N/A 03/28/2018   Procedure: MINIMALLY INVASIVE MAZE PROCEDURE;  Surgeon: Rexene Alberts, MD;  Location: [place];  Service: Open  Heart Surgery;  Laterality: N/A;  . MITRAL VALVE REPLACEMENT Right 03/28/2018   Procedure: MINIMALLY INVASIVE MITRAL VALVE (MV) REPLACEMENT. Magna Mitral Ease 45mm.;  Surgeon: Rexene Alberts, MD;  Location: [place];  Service: Open Heart Surgery;  Laterality: Right;  glutaraldehyde  . PERCUTANEOUS BALLOON VALVULOPLASTY  10/1998   [place]  . RIGHT/LEFT HEART CATH AND CORONARY ANGIOGRAPHY N/A 03/04/2018   Procedure: RIGHT/LEFT HEART CATH AND  CORONARY ANGIOGRAPHY;  Surgeon: Jettie Booze, MD;  Location: [place] [facility];  Service: Cardiovascular;  Laterality: N/A;  . screw in ankles  1989   right  . TEE WITHOUT CARDIOVERSION N/A 12/31/2017   Procedure: TRANSESOPHAGEAL ECHOCARDIOGRAM (TEE);  Surgeon: Lelon Perla, MD;  Location: [facility] ENDOSCOPY;  Service: Cardiovascular;  Laterality: N/A;  . TEE WITHOUT CARDIOVERSION N/A 03/28/2018   Procedure: TRANSESOPHAGEAL ECHOCARDIOGRAM (TEE);  Surgeon: Rexene Alberts, MD;  Location: [place] [facility];  Service: Open Heart Surgery;  Laterality: N/A;  . THYROIDECTOMY  05/30/2012   Procedure: THYROIDECTOMY;  Surgeon: Earnstine Regal, MD;  Location: [facility];  Service: General;  Laterality: N/A;  total thyroidectomy  . TONSILLECTOMY  1956  . TUBAL LIGATION      Social History   Socioeconomic History  . Marital status: Married    Spouse name: Not on file  . Number of children: 2  . Years of education: Not on file  . Highest education level: Not on file  Occupational History  . Occupation: Airline pilot    Employer: [employer]  Social Needs  . Financial resource strain: Not on file  . Food insecurity:    Worry: Not on file    Inability: Not on file  . Transportation needs:    Medical: Not on file    Non-medical: Not on file  Tobacco Use  . Smoking status: Never Smoker  . Smokeless tobacco: Never Used  Substance and Sexual Activity  . Alcohol use: Yes    Alcohol/week: 1.0 standard drinks    Types: 1 Glasses of wine per week    Comment: occas glass of wine weekly  . Drug use: No  . Sexual activity: Yes    Partners: Male    Birth control/protection: Surgical, Post-menopausal    Comment: BTL  Lifestyle  . Physical activity:    Days per week: Not on file    Minutes per session: Not on file  . Stress: Not on file  Relationships  . Social connections:    Talks on phone: Not on file    Gets together: Not on file    Attends religious service: Not on file    Active  member of club or organization: Not on file    Attends meetings of clubs or organizations: Not on file    Relationship status: Not on file  . Intimate partner violence:    Fear of current or ex partner: Not on file    Emotionally abused: Not on file    Physically abused: Not on file    Forced sexual activity: Not on file  Other Topics Concern  . Not on file  Social History Narrative  . Not on file    Family History  Problem Relation Age of Onset  . Lung cancer Father   . Cancer Father        lung  . Heart disease Mother        S/P CABG  . Cancer Brother        brain  . Hypertension  Sister   . Heart disease Maternal Grandmother     ROS: no fevers or chills, productive cough, hemoptysis, dysphasia, odynophagia, melena, hematochezia, dysuria, hematuria, rash, seizure activity, orthopnea, PND, pedal edema, claudication. Remaining systems are negative.  Physical Exam: Well-developed well-nourished in no acute distress.  Skin is warm and dry.  HEENT is normal.  Neck is supple.  Chest is clear to auscultation with normal expansion.  Cardiovascular exam is regular rate and rhythm.  Abdominal exam nontender or distended. No masses palpated. Extremities show no edema. neuro grossly intact  ECG-April 15, 2018-sinus rhythm with first-degree AV block.  Personally reviewed  Today's electrocardiogram shows sinus rhythm with occasional junctional escape beat.  Cannot rule out prior septal infarct.  A/P  1 status post mitral valve replacement with stented bovine pericardial tissue valve-we will arrange a baseline echocardiogram status post mitral valve replacement.  Continue SBE prophylaxis.  2 atrial fibrillation-patient had Maze procedure at time of mitral valve replacement.  She had longstanding atrial fibrillation prior to her surgery and risk of recurrent atrial fibrillation high.  Continue Coumadin.  Given need for Coumadin I will discontinue aspirin.  3 history of  palpitations-no recent symptoms.  Kirk Ruths, MD

## 2018-06-16 ENCOUNTER — Ambulatory Visit (INDEPENDENT_AMBULATORY_CARE_PROVIDER_SITE_OTHER): Payer: Self-pay | Admitting: Thoracic Surgery (Cardiothoracic Vascular Surgery)

## 2018-06-16 ENCOUNTER — Other Ambulatory Visit: Payer: Self-pay

## 2018-06-16 ENCOUNTER — Encounter: Payer: Self-pay | Admitting: Thoracic Surgery (Cardiothoracic Vascular Surgery)

## 2018-06-16 VITALS — BP 109/64 | HR 104 | Resp 16 | Ht 65.0 in | Wt 157.0 lb

## 2018-06-16 DIAGNOSIS — Z8679 Personal history of other diseases of the circulatory system: Secondary | ICD-10-CM

## 2018-06-16 DIAGNOSIS — Z9889 Other specified postprocedural states: Secondary | ICD-10-CM

## 2018-06-16 DIAGNOSIS — Z953 Presence of xenogenic heart valve: Secondary | ICD-10-CM

## 2018-06-16 NOTE — Patient Instructions (Signed)

## 2018-06-16 NOTE — Progress Notes (Signed)
[address]       [place] [address]             [phone]     CARDIOTHORACIC SURGERY OFFICE NOTE  Referring Provider is Stanford Breed, Denice Bors, MD PCP is Chesley Noon, MD   HPI:  Patient is a 68 year old female with history of rheumatic heart diseasewho returns to the office today for routine follow-up status post minimally invasive mitral valve replacement using the stented bovine pericardial tissue valve and minimally invasive maze procedure on March 28, 2018 for severe mitral stenosis and longstanding persistent atrial fibrillation.    She was last seen here in our office on April 21, 2018 at which time she was doing well.  She returns the office today for routine follow-up.  She reports that she is doing very well.  She admits that she did have some problems with depression for which she saw her primary care physician.  She has now taken Prozac and reports that her mood has improved dramatically.  She no longer has any significant pain in her chest.  Her breathing is good and she notes that it feels dramatically better than it was prior to surgery.  She still feels somewhat weak and her stamina is slowly improving.  She has not had any palpitations.  Appetite is improving   Current Outpatient Medications  Medication Sig Dispense Refill  . amoxicillin (AMOXIL) 500 MG capsule Take 2,000 mg by mouth See admin instructions. Take 2000 mg by mouth 1 hour prior to dental appointment  2  . aspirin 81 MG chewable tablet Chew 1 tablet (81 mg total) by mouth daily.    Marland Kitchen buPROPion (WELLBUTRIN XL) 300 MG 24 hr tablet TAKE 1 TABLET DAILY (Patient taking differently: Take 300 mg by mouth daily. ) 90 tablet 2  . FLUoxetine (PROZAC) 20 MG tablet Take 20 mg by mouth daily.    . furosemide (LASIX) 40 MG tablet Take 0.5 tablets (20 mg total) by mouth daily. 30 tablet 1  . levothyroxine (SYNTHROID, LEVOTHROID) 125 MCG tablet Take 1 tablet (125 mcg total) by mouth daily  before breakfast. 30 tablet 1  . Multiple Minerals-Vitamins (CALCIUM-MAGNESIUM-ZINC-D3 PO) Take 1 tablet by mouth daily.    . Multiple Vitamins-Minerals (MULTIVITAMIN WITH MINERALS) tablet Take 1 tablet by mouth daily.     . potassium chloride (KLOR-CON) 20 MEQ packet Take 10 mEq by mouth daily. 30 tablet 1  . warfarin (COUMADIN) 1 MG tablet Take 1 tablet (1 mg total) by mouth daily at 6 PM. As directed by the coumadin clinic 100 tablet 1   No current facility-administered medications for this visit.       Physical Exam:   BP 109/64 (BP Location: Right Arm, Patient Position: Sitting, Cuff Size: Normal)   Pulse (!) 104   Resp 16   Ht 5\' 5"  (1.651 m)   Wt 157 lb (71.2 kg)   LMP 05/07/2002 (Approximate)   SpO2 97% Comment: RA  BMI 26.13 kg/m   General:  Well-appearing  Chest:   Clear to auscultation  CV:   Regular rate and rhythm without murmur  Incisions:  Healing nicely  Abdomen:  Soft nontender  Extremities:  Warm and well-perfused  Diagnostic Tests:  2 channel telemetry rhythm strip demonstrates a narrow complex rhythm with what appear to be P waves with first-degree AV block.  The rhythm is either sinus rhythm, ectopic atrial rhythm, or atypical atrial flutter with heart rate approximately 100.  Marland Kitchen  Impression:  Patient is doing well and maintaining stable rhythm approximately 3 months status post minimally invasive mitral valve replacement and Maze procedure.  Plan:  We have not recommended any change to the patient's current medications.  I have encouraged the patient to continue to gradually increase her physical activity without any particular limitations.  At some point she will need routine follow-up echocardiogram to reassess function of her bioprosthetic tissue valve and to assess left ventricular function.  She is scheduled to see Dr. Stanford Breed for routine follow-up later this week.  She will return to our office next November, for routine follow-up and rhythm check.   She will call and return sooner should specific problems or questions arise.    Valentina Gu. Roxy Manns, MD 06/16/2018 12:24 PM

## 2018-06-19 ENCOUNTER — Encounter: Payer: Self-pay | Admitting: Cardiology

## 2018-06-19 ENCOUNTER — Ambulatory Visit: Payer: Medicare HMO | Admitting: Cardiology

## 2018-06-19 VITALS — BP 118/68 | HR 82 | Ht 65.0 in | Wt 161.0 lb

## 2018-06-19 DIAGNOSIS — Z953 Presence of xenogenic heart valve: Secondary | ICD-10-CM | POA: Diagnosis not present

## 2018-06-19 DIAGNOSIS — Z8679 Personal history of other diseases of the circulatory system: Secondary | ICD-10-CM | POA: Diagnosis not present

## 2018-06-19 DIAGNOSIS — R002 Palpitations: Secondary | ICD-10-CM

## 2018-06-19 DIAGNOSIS — Z9889 Other specified postprocedural states: Secondary | ICD-10-CM

## 2018-06-19 NOTE — Patient Instructions (Signed)
Medication Instructions:  NO CHANGE If you need a refill on your cardiac medications before your next appointment, please call your pharmacy.   Lab work: If you have labs (blood work) drawn today and your tests are completely normal, you will receive your results only by: Marland Kitchen MyChart Message (if you have MyChart) OR . A paper copy in the mail If you have any lab test that is abnormal or we need to change your treatment, we will call you to review the results.  Testing/Procedures: Your physician has requested that you have an echocardiogram. Echocardiography is a painless test that uses sound waves to create images of your heart. It provides your doctor with information about the size and shape of your heart and how well your heart's chambers and valves are working. This procedure takes approximately one hour. There are no restrictions for this procedure.  [place]  Follow-Up: At [facility], you and your health needs are our priority.  As part of our continuing mission to provide you with exceptional heart care, we have created designated Provider Care Teams.  These Care Teams include your primary Cardiologist (physician) and Advanced Practice Providers (APPs -  Physician Assistants and Nurse Practitioners) who all work together to provide you with the care you need, when you need it. You will need a follow up appointment in 6 months.  Please call our office 2 months in advance to schedule this appointment.  You may see Kirk Ruths, MD or one of the following Advanced Practice Providers on your designated Care Team:   Kerin Ransom, PA-C Roby Lofts, [place] . Sande Rives, PA-C    CALL IN June TO SCHEDULE APPOINTMENT IN [place]

## 2018-07-03 ENCOUNTER — Ambulatory Visit (HOSPITAL_COMMUNITY): Payer: Medicare HMO | Attending: Internal Medicine

## 2018-07-03 DIAGNOSIS — Z9889 Other specified postprocedural states: Secondary | ICD-10-CM | POA: Diagnosis present

## 2018-07-03 DIAGNOSIS — Z953 Presence of xenogenic heart valve: Secondary | ICD-10-CM | POA: Diagnosis not present

## 2018-07-03 DIAGNOSIS — Z8679 Personal history of other diseases of the circulatory system: Secondary | ICD-10-CM | POA: Diagnosis present

## 2018-09-14 ENCOUNTER — Other Ambulatory Visit: Payer: Self-pay | Admitting: Thoracic Surgery (Cardiothoracic Vascular Surgery)

## 2018-12-30 ENCOUNTER — Other Ambulatory Visit: Payer: Self-pay | Admitting: Family Medicine

## 2018-12-30 DIAGNOSIS — Z1231 Encounter for screening mammogram for malignant neoplasm of breast: Secondary | ICD-10-CM

## 2019-01-23 NOTE — Progress Notes (Signed)
HPI: FU permanent atrial fibrillation, MS and rheumatic heart disease. She has had prior balloon valvuloplasty at [facility] in June 2000.TEE August 2019 showed normal LV function, biatrial enlargement, mild right ventricular enlargement, severe mitral stenosis with mitral valve area of 1 cm, mild to moderate mitral regurgitation and mild tricuspid regurgitation.  Cardiac catheterization October 2019 showed nonobstructive coronary disease.  Carotid Dopplers November 2019 showed 1 to 39% bilateral stenosis.  Patient had minimally invasive mitral valve replacement with an Edwards magna mitral stented bovine pericardial tissue valve and minimally invasive maze procedure 11/19.  Echocardiogram February 2020 showed normal LV function, biatrial enlargement, bioprosthetic mitral valve with mean gradient 4 mmHg and trace MR. Since I last saw her,she denies dyspnea, chest pain, palpitations or syncope.  No pedal edema.  Current Outpatient Medications  Medication Sig Dispense Refill  . amoxicillin (AMOXIL) 500 MG capsule Take 2,000 mg by mouth See admin instructions. Take 2000 mg by mouth 1 hour prior to dental appointment  2  . buPROPion (WELLBUTRIN XL) 300 MG 24 hr tablet TAKE 1 TABLET DAILY 90 tablet 2  . levothyroxine (SYNTHROID) 112 MCG tablet Take 112 mcg by mouth daily.    . Multiple Minerals-Vitamins (CALCIUM-MAGNESIUM-ZINC-D3 PO) Take 1 tablet by mouth daily.    . Multiple Vitamins-Minerals (MULTIVITAMIN WITH MINERALS) tablet Take 1 tablet by mouth daily.     Marland Kitchen warfarin (COUMADIN) 1 MG tablet Take 1 tablet (1 mg total) by mouth daily at 6 PM. As directed by the coumadin clinic (Patient taking differently: Take 3 mg by mouth daily at 6 PM. As directed by the coumadin clinic) 100 tablet 1   No current facility-administered medications for this visit.      Past Medical History:  Diagnosis Date  . Atrial fibrillation ([place])   . Chronic diastolic congestive heart failure ([place])   .  Complication of anesthesia   . Depression   . Hematuria    workup by Dr. Diona Fanti  . Longstanding persistent atrial fibrillation   . MITRAL REGURGITATION   . Mitral stenosis   . PONV (postoperative nausea and vomiting)   . RHEUMATIC HEART DISEASE   . S/P Minimally invasive maze operation for atrial fibrillation 03/28/2018   Complete bilateral atrial lesion set using crythermy and bipolar radiofrequency ablation with clipping of LA appendage via right mini thoracotomy  . S/P minimally invasive mitral valve replacement with bioprosthetic valve 03/28/2018   31 mm [facility] Mitral stented bovine pericardial tissue valve   . Thyroid nodule   . Wrist fracture, left 2019   Dr. Sharol Given    Past Surgical History:  Procedure Laterality Date  . ABDOMINAL AORTOGRAM N/A 03/04/2018   Procedure: ABDOMINAL AORTOGRAM;  Surgeon: Jettie Booze, MD;  Location: [employer] CV LAB;  Service: Cardiovascular;  Laterality: N/A;  . CARDIOVERSION     2000  . CESAREAN SECTION    . COLONOSCOPY    . MINIMALLY INVASIVE MAZE PROCEDURE N/A 03/28/2018   Procedure: MINIMALLY INVASIVE MAZE PROCEDURE;  Surgeon: Rexene Alberts, MD;  Location: [place];  Service: Open Heart Surgery;  Laterality: N/A;  . MITRAL VALVE REPLACEMENT Right 03/28/2018   Procedure: MINIMALLY INVASIVE MITRAL VALVE (MV) REPLACEMENT. Magna Mitral Ease 1mm.;  Surgeon: Rexene Alberts, MD;  Location: [place];  Service: Open Heart Surgery;  Laterality: Right;  glutaraldehyde  . PERCUTANEOUS BALLOON VALVULOPLASTY  10/1998   [place]  . RIGHT/LEFT HEART CATH AND CORONARY ANGIOGRAPHY N/A 03/04/2018   Procedure: RIGHT/LEFT  HEART CATH AND CORONARY ANGIOGRAPHY;  Surgeon: Jettie Booze, MD;  Location: [place] CV LAB;  Service: Cardiovascular;  Laterality: N/A;  . screw in ankles  1989   right  . TEE WITHOUT CARDIOVERSION N/A 12/31/2017   Procedure: TRANSESOPHAGEAL ECHOCARDIOGRAM (TEE);  Surgeon: Lelon Perla, MD;  Location: [facility]  ENDOSCOPY;  Service: Cardiovascular;  Laterality: N/A;  . TEE WITHOUT CARDIOVERSION N/A 03/28/2018   Procedure: TRANSESOPHAGEAL ECHOCARDIOGRAM (TEE);  Surgeon: Rexene Alberts, MD;  Location: [place];  Service: Open Heart Surgery;  Laterality: N/A;  . THYROIDECTOMY  05/30/2012   Procedure: THYROIDECTOMY;  Surgeon: Earnstine Regal, MD;  Location: [facility];  Service: General;  Laterality: N/A;  total thyroidectomy  . TONSILLECTOMY  1956  . TUBAL LIGATION      Social History   Socioeconomic History  . Marital status: Married    Spouse name: Not on file  . Number of children: 2  . Years of education: Not on file  . Highest education level: Not on file  Occupational History  . Occupation: Airline pilot    Employer: [employer]  Social Needs  . Financial resource strain: Not on file  . Food insecurity    Worry: Not on file    Inability: Not on file  . Transportation needs    Medical: Not on file    Non-medical: Not on file  Tobacco Use  . Smoking status: Never Smoker  . Smokeless tobacco: Never Used  Substance and Sexual Activity  . Alcohol use: Yes    Alcohol/week: 1.0 standard drinks    Types: 1 Glasses of wine per week    Comment: occas glass of wine weekly  . Drug use: No  . Sexual activity: Yes    Partners: Male    Birth control/protection: Surgical, Post-menopausal    Comment: BTL  Lifestyle  . Physical activity    Days per week: Not on file    Minutes per session: Not on file  . Stress: Not on file  Relationships  . Social Herbalist on phone: Not on file    Gets together: Not on file    Attends religious service: Not on file    Active member of club or organization: Not on file    Attends meetings of clubs or organizations: Not on file    Relationship status: Not on file  . Intimate partner violence    Fear of current or ex partner: Not on file    Emotionally abused: Not on file    Physically abused: Not on file    Forced sexual activity:  Not on file  Other Topics Concern  . Not on file  Social History Narrative  . Not on file    Family History  Problem Relation Age of Onset  . Lung cancer Father   . Cancer Father        lung  . Heart disease Mother        S/P CABG  . Cancer Brother        brain  . Hypertension Sister   . Heart disease Maternal Grandmother     ROS: no fevers or chills, productive cough, hemoptysis, dysphasia, odynophagia, melena, hematochezia, dysuria, hematuria, rash, seizure activity, orthopnea, PND, pedal edema, claudication. Remaining systems are negative.  Physical Exam: Well-developed well-nourished in no acute distress.  Skin is warm and dry.  HEENT is normal.  Neck is supple.  Chest is clear to auscultation with normal expansion.  Cardiovascular exam is irregular Abdominal exam nontender or distended. No masses palpated. Extremities show no edema. neuro grossly intact  Electrocardiogram today shows sinus rhythm, first-degree AV block, cannot rule out prior septal infarct.  A/P  1 prior mitral valve replacement-follow-up echocardiogram showed normally functioning valve.  Continue SBE prophylaxis.  2 paroxysmal atrial fibrillation-patient did have Maze procedure at time of prior mitral valve replacement.  She remains in sinus rhythm today.  She had longstanding atrial fibrillation prior to her surgery and risk of recurrence will be high.  We will continue Coumadin.  3 history of palpitations-no recent symptoms.  Kirk Ruths, MD

## 2019-01-26 ENCOUNTER — Other Ambulatory Visit: Payer: Self-pay

## 2019-01-26 ENCOUNTER — Encounter: Payer: Self-pay | Admitting: Cardiology

## 2019-01-26 ENCOUNTER — Ambulatory Visit: Payer: Medicare HMO | Admitting: Cardiology

## 2019-01-26 VITALS — BP 124/80 | HR 97 | Ht 65.0 in | Wt 165.0 lb

## 2019-01-26 DIAGNOSIS — I480 Paroxysmal atrial fibrillation: Secondary | ICD-10-CM

## 2019-01-26 DIAGNOSIS — Z953 Presence of xenogenic heart valve: Secondary | ICD-10-CM

## 2019-01-26 DIAGNOSIS — R002 Palpitations: Secondary | ICD-10-CM

## 2019-01-26 NOTE — Patient Instructions (Signed)
Medication Instructions:  NO CHANGE If you need a refill on your cardiac medications before your next appointment, please call your pharmacy.   Lab work: If you have labs (blood work) drawn today and your tests are completely normal, you will receive your results only by: . MyChart Message (if you have MyChart) OR . A paper copy in the mail If you have any lab test that is abnormal or we need to change your treatment, we will call you to review the results.  Follow-Up: At [facility], you and your health needs are our priority.  As part of our continuing mission to provide you with exceptional heart care, we have created designated Provider Care Teams.  These Care Teams include your primary Cardiologist (physician) and Advanced Practice Providers (APPs -  Physician Assistants and Nurse Practitioners) who all work together to provide you with the care you need, when you need it. You will need a follow up appointment in 12 months.  Please call our office 2 months in advance to schedule this appointment.  You may see Brian Crenshaw, MD or one of the following Advanced Practice Providers on your designated Care Team:   Luke Kilroy, PA-C Krista Kroeger, PA-C . Callie Goodrich, PA-C     

## 2019-02-11 ENCOUNTER — Ambulatory Visit
Admission: RE | Admit: 2019-02-11 | Discharge: 2019-02-11 | Disposition: A | Discharge status: Home / Self Care | Payer: Medicare HMO | Source: Ambulatory Visit | Attending: Family Medicine | Admitting: Family Medicine

## 2019-02-11 ENCOUNTER — Other Ambulatory Visit: Payer: Self-pay

## 2019-02-11 DIAGNOSIS — Z1231 Encounter for screening mammogram for malignant neoplasm of breast: Secondary | ICD-10-CM

## 2019-03-23 ENCOUNTER — Ambulatory Visit: Payer: Medicare HMO | Admitting: Thoracic Surgery (Cardiothoracic Vascular Surgery)

## 2019-03-23 ENCOUNTER — Other Ambulatory Visit: Payer: Self-pay

## 2019-03-23 ENCOUNTER — Encounter: Payer: Self-pay | Admitting: Thoracic Surgery (Cardiothoracic Vascular Surgery)

## 2019-03-23 VITALS — BP 123/63 | HR 94 | Temp 97.7°F | Resp 20 | Ht 65.0 in | Wt 169.0 lb

## 2019-03-23 DIAGNOSIS — Z8679 Personal history of other diseases of the circulatory system: Secondary | ICD-10-CM

## 2019-03-23 DIAGNOSIS — Z953 Presence of xenogenic heart valve: Secondary | ICD-10-CM | POA: Diagnosis not present

## 2019-03-23 DIAGNOSIS — Z9889 Other specified postprocedural states: Secondary | ICD-10-CM

## 2019-03-23 NOTE — Progress Notes (Signed)
[address]       ,[place] [address]             [phone]     CARDIOTHORACIC SURGERY OFFICE NOTE  Primary Cardiologist is Kirk Ruths, MD PCP is Chesley Noon, MD   HPI:  Patient is a 68 year old female with history of rheumatic heart diseasewhoreturns to the office today for routine follow-up status post minimally invasive mitral valve replacement using the stented bovine pericardial tissue valve and minimally invasive maze procedure on March 28, 2018 for severe mitral stenosis and longstanding persistent atrial fibrillation.  She was last seen here in our office on June 16, 2018 at which time she was doing well and maintaining sinus rhythm.  Routine follow-up echocardiogram performed shortly after that revealed normal left ventricular systolic function with normal functioning bioprosthetic tissue valve in the mitral position.  She was seen recently by Dr. Stanford Breed on January 26, 2019 and was still maintaining sinus rhythm at that time.  She returns her office today and reports that she is doing exceptionally well.  Overall she feels "much improved" in comparison with how she felt prior to her surgery.  She states that her breathing is dramatically better and her exercise tolerance is quite good.  She believes that she is staying in sinus rhythm.  She has not had any palpitations or other symptoms to suggest a recurrence of atrial fibrillation.  She remains chronically anticoagulated using warfarin.   Current Outpatient Medications  Medication Sig Dispense Refill  . amoxicillin (AMOXIL) 500 MG capsule Take 2,000 mg by mouth See admin instructions. Take 2000 mg by mouth 1 hour prior to dental appointment  2  . buPROPion (WELLBUTRIN XL) 300 MG 24 hr tablet TAKE 1 TABLET DAILY 90 tablet 2  . levothyroxine (SYNTHROID) 112 MCG tablet Take 112 mcg by mouth daily.    . Multiple Minerals-Vitamins (CALCIUM-MAGNESIUM-ZINC-D3 PO) Take 1 tablet by mouth  daily.    . Multiple Vitamins-Minerals (MULTIVITAMIN WITH MINERALS) tablet Take 1 tablet by mouth daily.     Marland Kitchen warfarin (COUMADIN) 1 MG tablet Take 1 tablet (1 mg total) by mouth daily at 6 PM. As directed by the coumadin clinic (Patient taking differently: Take 3 mg by mouth daily at 6 PM. As directed by the coumadin clinic) 100 tablet 1   No current facility-administered medications for this visit.       Physical Exam:   BP 123/63 (BP Location: Left Arm)   Pulse 94   Temp 97.7 F (36.5 C) (Skin)   Resp 20   Ht 5\' 5"  (1.651 m)   Wt 169 lb (76.7 kg)   LMP 05/07/2002 (Approximate)   SpO2 97% Comment: RA  BMI 28.12 kg/m   General:  Well-appearing  Chest:   Clear to auscultation  CV:   Regular rate and rhythm without murmur  Incisions:  Completely healed  Abdomen:  Soft nontender  Extremities:  Warm and well-perfused  Diagnostic Tests:  2 channel telemetry rhythm strip demonstrates normal sinus rhythm   ECHOCARDIOGRAM REPORT       Patient Name:   Lorraine Kemp Date of Exam: 07/03/2018 Medical Rec #:  [id number]     Height:       65.0 in Accession #:    [id number]    Weight:       161.0 lb Date of Birth:  04-20-1951     BSA:          1.80  m Patient Age:    13 years      BP:           118/68 mmHg Patient Gender: F             HR:           94 bpm. Exam Location:  [address]    Procedure: 2D Echo, 3D Echo, Cardiac Doppler, Color Doppler and Strain Analysis  Indications:    Z95.3 Mitral valve replacement   History:        Patient has prior history of Echocardiogram examinations, most                 recent 12/12/2017. CHF, Mitral Valve Disease and Rheumatic heart                 disease; Signs/Symptoms: Dyspnea. Mitral Valve: A 72mm Edwards                 Magna bioprosthetic valve is present in the mitral position.   Sonographer:    Basilia Jumbo RDCS Referring Phys: [place]    1. The left ventricle has normal systolic function,  with an ejection fraction of 55-60%. The cavity size was normal. There is moderately increased left ventricular wall thickness. Left ventricular diastology could not be evaluated due to mitral valve  replacement/repair.  2. The right ventricle has normal systolic function. The cavity was mildly enlarged. There is no increase in right ventricular wall thickness.  3. Left atrial size was severely dilated.  4. Right atrial size was mildly dilated.  5. A 86mm Edwards Magna bioprosthetic valve is present in the mitral position. Normal mitral valve prosthesis.  6. The pericardial effusion is circumferential.  7. Trivial pericardial effusion is present.  8. No evidence of mitral valve stenosis.  9. The aortic valve is tricuspid. 10. The aortic root and ascending aorta are normal in size and structure. 11. The interatrial septum was not well visualized. 12. When compared to the prior study: 12/12/17 EF 55-60%. Severe MS 39mmHg mean, 15mmHg peak. PA 43 - compared to this study, the mitral valve has been replaced.  SUMMARY   LVEF 55-60%, moderate LVH, normal wall motion, severe LAE, bioprosthetic MVR without obstruction or perivalvular leak, IVC suggests elevated RA pressure of 8 mmHg  FINDINGS  Left Ventricle: The left ventricle has normal systolic function, with an ejection fraction of 55-60%. The cavity size was normal. There is moderately increased left ventricular wall thickness. Left ventricular diastology could not be evaluated due to  mitral valve replacement/repair. Right Ventricle: The right ventricle has normal systolic function. The cavity was mildly enlarged. There is no increase in right ventricular wall thickness. Left Atrium: left atrial size was severely dilated Right Atrium: right atrial size was mildly dilated. Right atrial pressure is estimated at 8 mmHg. Interatrial Septum: The interatrial septum was not well visualized. Pericardium: Trivial pericardial effusion is present. The  pericardial effusion is circumferential. Mitral Valve: The mitral valve has been repaired/replaced. Mitral valve regurgitation is trivial by color flow Doppler. No evidence of mitral valve stenosis. A 75mm Edwards Magna bioprosthetic valve is present in the mitral position. Normal mitral valve  prosthesis. Tricuspid Valve: The tricuspid valve is not well visualized. Tricuspid valve regurgitation is trivial by color flow Doppler. Aortic Valve: The aortic valve is tricuspid Aortic valve regurgitation was not visualized by color flow Doppler. There is no evidence of aortic valve stenosis. Pulmonic Valve: The pulmonic valve  was grossly normal. Pulmonic valve regurgitation is trivial by color flow Doppler. Aorta: The aortic root and ascending aorta are normal in size and structure. Compared to previous exam: 12/12/17 EF 55-60%. Severe MS 32mmHg mean, 44mmHg peak. PA 43 - compared to this study, the mitral valve has been replaced.   LEFT VENTRICLE PLAX 2D (Teich) LV EF:          50.5 %   Diastology LVIDd:          4.50 cm  LV e' lateral:   7.21 cm/s LVIDs:          3.35 cm  LV E/e' lateral: 21.5 LV PW:          0.90 cm  LV e' medial:    8.52 cm/s LV IVS:         1.00 cm  LV E/e' medial:  18.2 LVOT diam:      1.90 cm LV SV:          47 ml    2D Longitudinal Strain LVOT Area:      2.84 cm 2D Strain GLS (A2C):   -8.8 %                          2D Strain GLS (A3C):   -8.0 %                          2D Strain GLS (A4C):   -10.1 %                          2D Strain GLS Avg:     -9.0 %                            3D Volume EF:                          3D EF:        48 %                          LV EDV:       105 ml                          LV ESV:       54 ml                          LV SV:        51 ml  RIGHT VENTRICLE RV S prime:     9.34 cm/s TAPSE (M-mode): 1.8 cm RVSP:           27.0 mmHg  LEFT ATRIUM              Index       RIGHT ATRIUM LA diam:        5.40 cm  2.99 cm/m  RA Pressure: 8  mmHg LA Vol (A2C):   95.4 ml  52.88 ml/m LA Vol (A4C):   119.0 ml 65.97 ml/m LA Biplane Vol: 114.0 ml 63.20 ml/m  AORTIC VALVE LVOT Vmax:   129.00 cm/s LVOT Vmean:  91.500 cm/s LVOT VTI:    0.248 m   AORTA Ao Root diam: 2.70 cm Ao Asc  diam:  3.20 cm  MITRAL VALVE               TRICUSPID VALVE MV Area (PHT): 6.77 cm    TR Peak grad:   19.0 mmHg MV Peak grad:  11.2 mmHg   TR Vmax:        252.00 cm/s MV Mean grad:  4.0 mmHg    RVSP:           27.0 mmHg MV Vmax:       1.67 m/s MV Vmean:      87.8 cm/s MV VTI:        0.31 m MV PHT:        32.48 msec MV Decel Time: 112 msec MV E velocity: 155.00 cm/s MV A velocity: 76.60 cm/s MV E/A ratio:  2.02    Lyman Bishop MD Electronically signed by Lyman Bishop MD Signature Date/Time: 07/03/2018/12:49:48 PM       Impression:  Patient is doing very well and maintaining sinus rhythm approximately 1 year status post minimally invasive mitral valve replacement using a bioprosthetic tissue valve and maze procedure  Plan:  We have not recommended any changes to the patient's current medications.  The patient will continue to follow-up intermittently with Dr. Stanford Breed and return to our office in the future only should specific problems or questions arise.  The patient has been reminded regarding the importance of dental hygiene and the lifelong need for antibiotic prophylaxis for all dental cleanings and other related invasive procedures.   I spent in excess of 10 minutes during the conduct of this office consultation and >50% of this time involved direct face-to-face encounter with the patient for counseling and/or coordination of their care.    Valentina Gu. Roxy Manns, MD 03/23/2019 12:00 PM

## 2019-03-23 NOTE — Patient Instructions (Signed)

## 2020-03-04 ENCOUNTER — Other Ambulatory Visit: Payer: Self-pay | Admitting: Family Medicine

## 2020-03-04 DIAGNOSIS — Z1231 Encounter for screening mammogram for malignant neoplasm of breast: Secondary | ICD-10-CM

## 2020-04-18 ENCOUNTER — Ambulatory Visit
Admission: RE | Admit: 2020-04-18 | Discharge: 2020-04-18 | Disposition: A | Discharge status: Home / Self Care | Payer: Medicare HMO | Source: Ambulatory Visit | Attending: Family Medicine | Admitting: Family Medicine

## 2020-04-18 ENCOUNTER — Other Ambulatory Visit: Payer: Self-pay

## 2020-04-18 DIAGNOSIS — Z1231 Encounter for screening mammogram for malignant neoplasm of breast: Secondary | ICD-10-CM

## 2020-06-16 IMAGING — MG MM DIGITAL SCREENING BILAT W/ TOMO W/ CAD
6 of 10 series · 6 of 30 positions shown · non-contrast
Comparison: Previous exam(s).

CLINICAL DATA: Screening.

EXAM:
DIGITAL SCREENING BILATERAL MAMMOGRAM WITH TOMO AND CAD

[R CC synth-2D (1 of 2)]
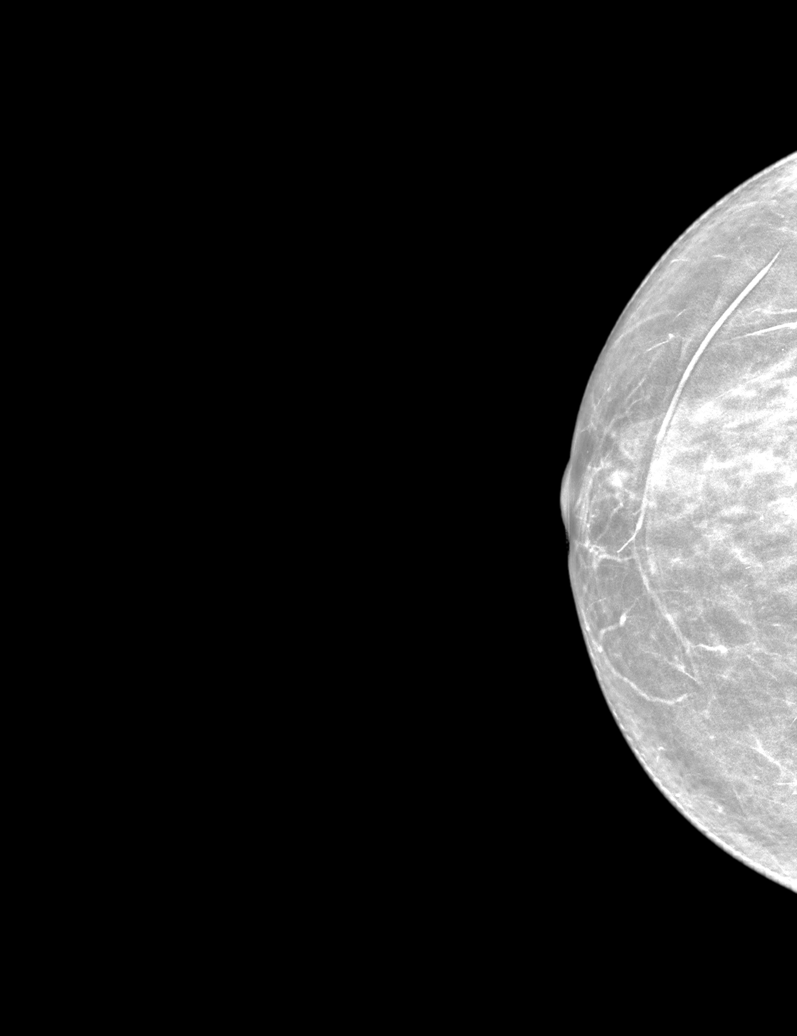

[L CC synth-2D]
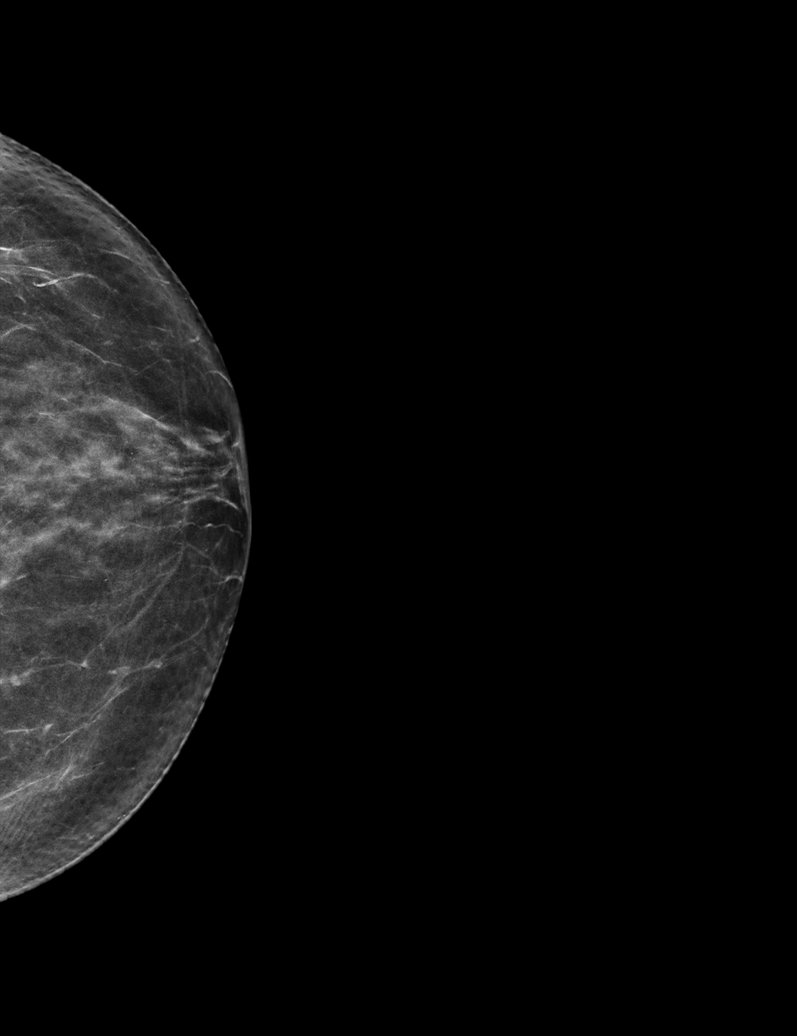

[R CC synth-2D (2 of 2)]
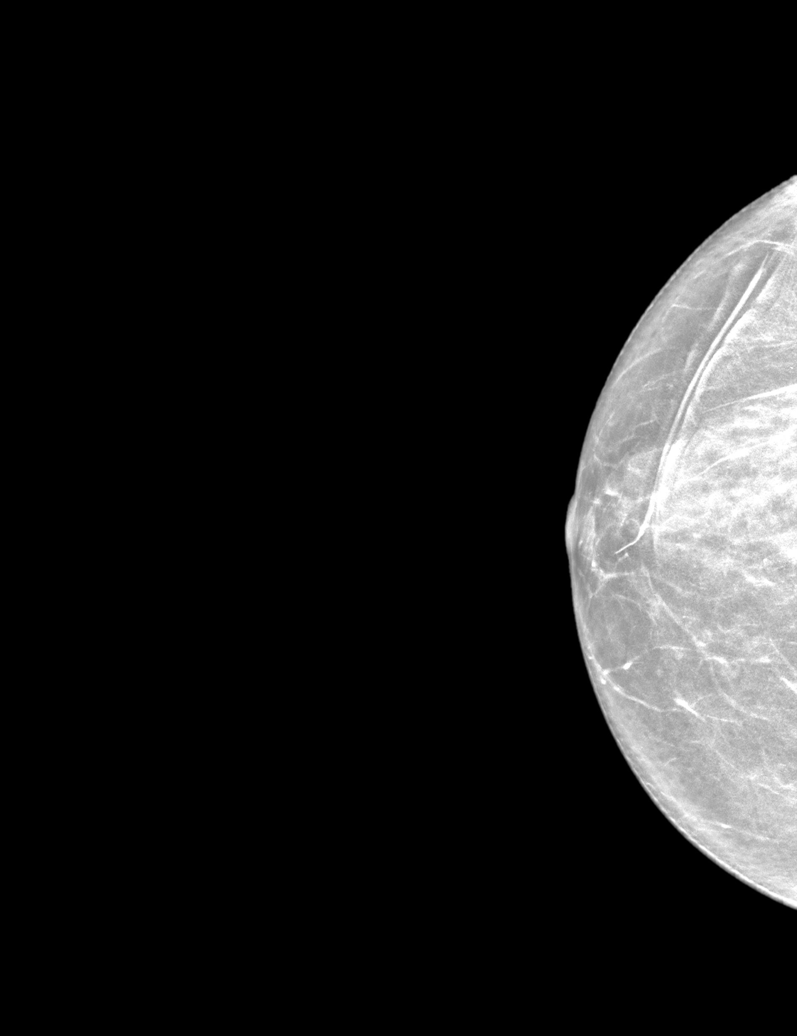

[R MLO synth-2D]
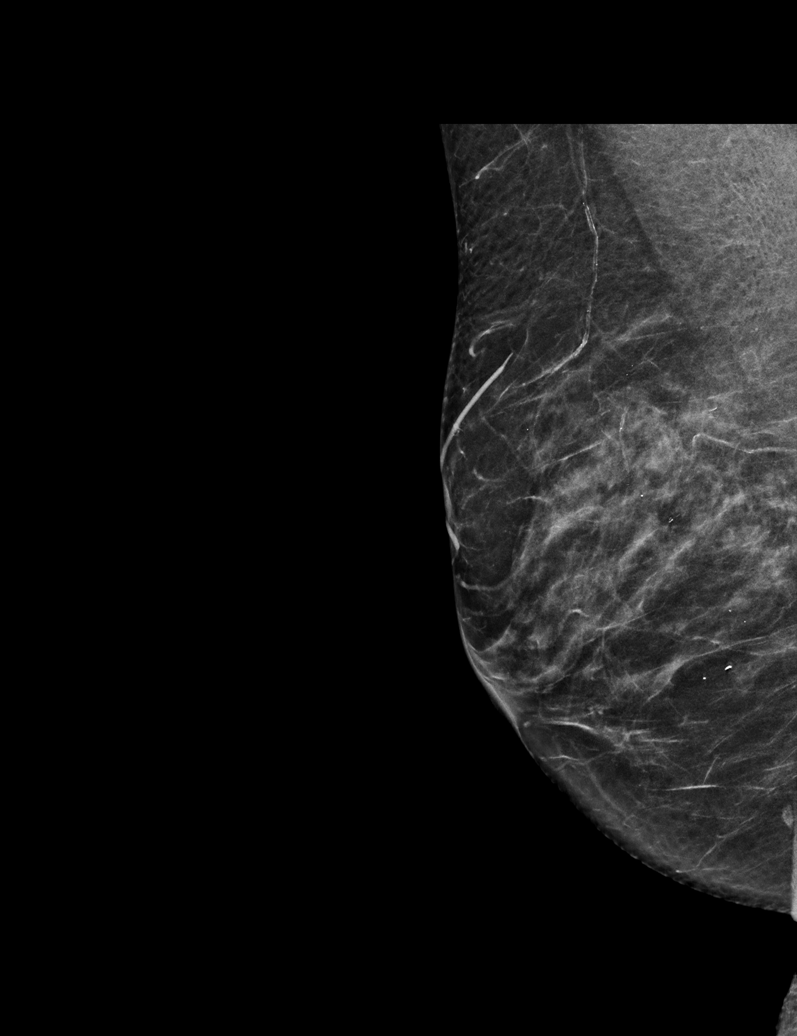

[L MLO synth-2D]
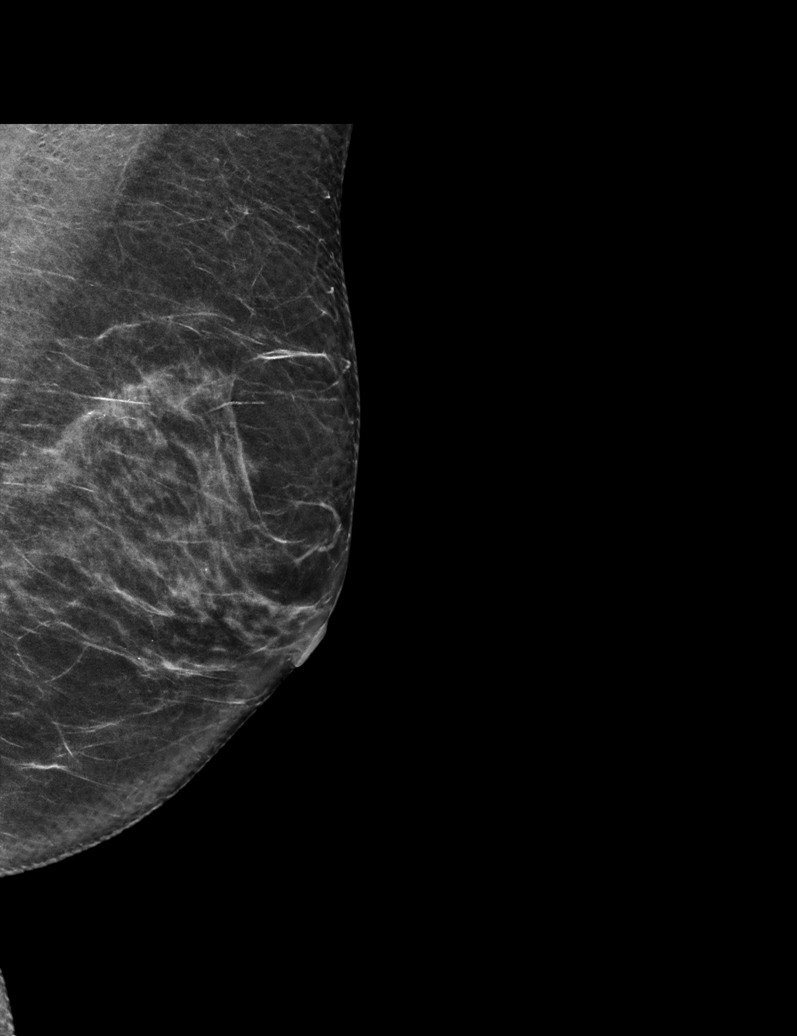

[L MLO tomo · tomo slice 31/60.0]
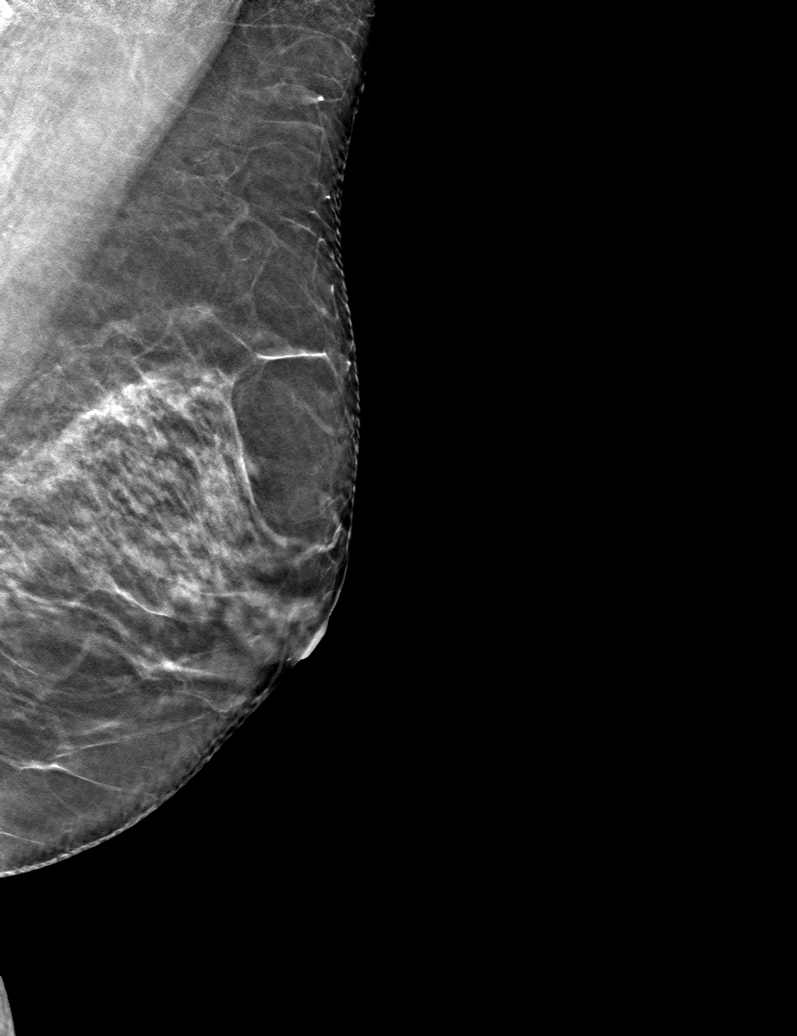

[6 of 30 positions shown; findings below may reference images not displayed]

ACR Breast Density Category c: The breast tissue is heterogeneously
dense, which may obscure small masses.
FINDINGS: There are no findings suspicious for malignancy. Images were
processed with CAD.
IMPRESSION: No mammographic evidence of malignancy. A result letter of this
screening mammogram will be mailed directly to the patient.

RECOMMENDATION:
Screening mammogram in one year. (Code:[id number])

BI-RADS CATEGORY  1: Negative.

## 2021-04-17 ENCOUNTER — Other Ambulatory Visit: Payer: Self-pay | Admitting: Physician Assistant

## 2021-04-17 ENCOUNTER — Other Ambulatory Visit: Payer: Self-pay | Admitting: Family Medicine

## 2021-04-17 DIAGNOSIS — M8588 Other specified disorders of bone density and structure, other site: Secondary | ICD-10-CM

## 2021-04-17 DIAGNOSIS — Z1231 Encounter for screening mammogram for malignant neoplasm of breast: Secondary | ICD-10-CM

## 2021-05-18 ENCOUNTER — Ambulatory Visit: Payer: Medicare HMO

## 2021-06-01 ENCOUNTER — Ambulatory Visit
Admission: RE | Admit: 2021-06-01 | Discharge: 2021-06-01 | Disposition: A | Discharge status: Home / Self Care | Payer: Medicare HMO | Source: Ambulatory Visit | Attending: Family Medicine | Admitting: Family Medicine

## 2021-06-01 DIAGNOSIS — Z1231 Encounter for screening mammogram for malignant neoplasm of breast: Secondary | ICD-10-CM

## 2021-07-14 ENCOUNTER — Encounter: Payer: Self-pay | Admitting: Physician Assistant

## 2021-07-14 ENCOUNTER — Telehealth: Payer: Self-pay

## 2021-07-14 NOTE — Telephone Encounter (Signed)
I have called and left a voicemail for patient to call back and schedule an appointment for recall colonoscopy. ?

## 2021-07-26 ENCOUNTER — Ambulatory Visit: Payer: Medicare HMO | Admitting: Physician Assistant

## 2021-07-26 ENCOUNTER — Telehealth: Payer: Self-pay

## 2021-07-26 ENCOUNTER — Ambulatory Visit: Payer: Medicare HMO | Admitting: Gastroenterology

## 2021-07-26 ENCOUNTER — Encounter: Payer: Self-pay | Admitting: Physician Assistant

## 2021-07-26 VITALS — BP 118/80 | HR 96 | Ht 65.0 in | Wt 174.0 lb

## 2021-07-26 DIAGNOSIS — Z1211 Encounter for screening for malignant neoplasm of colon: Secondary | ICD-10-CM

## 2021-07-26 DIAGNOSIS — I4811 Longstanding persistent atrial fibrillation: Secondary | ICD-10-CM

## 2021-07-26 DIAGNOSIS — I5032 Chronic diastolic (congestive) heart failure: Secondary | ICD-10-CM

## 2021-07-26 DIAGNOSIS — Z01818 Encounter for other preprocedural examination: Secondary | ICD-10-CM

## 2021-07-26 NOTE — Patient Instructions (Signed)
If you are age 73 or older, your body mass index should be between 23-30. Your Body mass index is 28.96 kg/m?Marland Kitchen If this is out of the aforementioned range listed, please consider follow up with your Primary Care Provider. ?________________________________________________________ ? ?The [place] [facility] providers would like to encourage you to use [facility] to communicate with providers for non-urgent requests or questions.  Due to long hold times on the telephone, sending your provider a message by [facility] may be a faster and more efficient way to get a response.  Please allow 48 business hours for a response.  Please remember that this is for non-urgent requests.  ?_______________________________________________________ ? ?You have been scheduled for a colonoscopy. Please follow written instructions given to you at your visit today.  ?Please pick up your prep supplies at the pharmacy within the next 1-3 days. ?If you use inhalers (even only as needed), please bring them with you on the day of your procedure. ? ?You will be contaced by our office prior to your procedure for directions on holding your Coumadin/Warfarin.  If you do not hear from our office 1 week prior to your scheduled procedure, please call [phone] to discuss. ? ?Follow up as needed for now. ? ?Thank you for entrusting me with your care and choosing [facility]. ? ?Nicoletta Ba, PA-C ?

## 2021-07-26 NOTE — Progress Notes (Signed)
? ?Subjective:  ? ? Patient ID: Lorraine Kemp, female    DOB: May 24, 1950, 71 y.o.   MRN: [id number] ? ?HPI ?Lorraine Kemp is a pleasant 71 year old white female, who comes in today to discuss recall colonoscopy in the setting of chronic Coumadin use. ?Patient had undergone colonoscopy in December 2012 per Dr. Deatra Kemp for screening, and this was a normal exam, and she was indicated for 10-year interval follow-up. ?She denies any current GI issues, specifically denies any abdominal pain or discomfort, no changes in bowel habits, no melena or hematochezia. ?Family history is negative for colon cancers and polyps. ? ?Patient has history of rheumatic heart disease, congestive heart failure, atrial fibrillation for which she underwent prior Maze procedure.  Also with mitral valve disease, status post mitral valve replacement with bioprosthetic valve 2019. ?Most recent echo 2020 with EF of 55-60%, no aortic stenosis.  She is followed by Dr. Crenshaw/cardiology.  She has been getting her anticoagulation parameters checked through her PCP. ? ?Patient's husband has been a patient of Dr. Carlean Kemp, and they are requesting Dr. Carlean Kemp perform her colonoscopy. ? ?Review of Systems Pertinent positive and negative review of systems were noted in the above HPI section.  All other review of systems was otherwise negative.  ? ?Outpatient Encounter Medications as of 07/26/2021  ?Medication Sig  ? amoxicillin (AMOXIL) 500 MG capsule Take 2,000 mg by mouth See admin instructions. Take 2000 mg by mouth 1 hour prior to dental appointment  ? DULoxetine (CYMBALTA) 30 MG capsule Take 1 capsule by mouth daily.  ? levothyroxine (SYNTHROID) 112 MCG tablet Take 112 mcg by mouth daily.  ? Multiple Minerals-Vitamins (CALCIUM-MAGNESIUM-ZINC-D3 PO) Take 1 tablet by mouth daily.  ? Multiple Vitamins-Minerals (MULTIVITAMIN WITH MINERALS) tablet Take 1 tablet by mouth daily.   ? warfarin (COUMADIN) 1 MG tablet Take 1 tablet (1 mg total) by mouth daily at 6 PM. As  directed by the coumadin clinic (Patient taking differently: Take 3 mg by mouth daily at 6 PM. As directed by the coumadin clinic)  ? [DISCONTINUED] buPROPion (WELLBUTRIN XL) 300 MG 24 hr tablet TAKE 1 TABLET DAILY  ? ?No facility-administered encounter medications on file as of 07/26/2021.  ? ?No Known Allergies ?Patient Active Problem List  ? Diagnosis Date Noted  ? S/P minimally invasive mitral valve replacement with bioprosthetic valve 03/28/2018  ? S/P Minimally invasive maze operation for atrial fibrillation 03/28/2018  ? Chronic diastolic congestive heart failure (Lorraine Kemp)   ? Hypothyroidism, postsurgical 05/30/2012  ? 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 Dizziness 03/20/2012  ? Special screening for malignant neoplasms, colon 03/19/2011  ? DYSPNEA 02/15/2009  ? Mitral stenosis 08/26/2008  ? MITRAL REGURGITATION 08/26/2008  ? RHEUMATIC HEART DISEASE 08/26/2008  ? Longstanding persistent atrial fibrillation (Lorraine Kemp)   ? ?Social History  ? ?Socioeconomic History  ? Marital status: Married  ?  Spouse name: Not on file  ? Number of children: 2  ? Years of education: Not on file  ? Highest education level: Not on file  ?Occupational History  ? Occupation: Airline pilot  ?  Employer: [employer] K[employer]  ?Tobacco Use  ? Smoking status: Never  ? Smokeless tobacco: Never  ?Vaping Use  ? Vaping Use: Never used  ?Substance and Sexual Activity  ? Alcohol use: Yes  ?  Alcohol/week: 1.0 standard drink  ?  Types: 1 Glasses of wine per week  ?  Comment: occas glass of wine weekly  ? Drug use: No  ? Sexual  activity: Yes  ?  Partners: Male  ?  Birth control/protection: Surgical, Post-menopausal  ?  Comment: BTL  ?Other Topics Concern  ? Not on file  ?Social History Narrative  ? Not on file  ? ?Social Determinants of Health  ? ?Financial Resource Strain: Not on file  ?Food Insecurity: Not on file  ?Transportation Needs: Not on file  ?Physical Activity: Not on file  ?Stress: Not on file  ?Social  Connections: Not on file  ?Intimate Partner Violence: Not on file  ? ? ?Ms. Lorraine Kemp family history includes Cancer in her brother and father; Heart disease in her maternal grandmother and mother; Hypertension in her sister; Lung cancer in her father. ? ? ?   ?Objective:  ?  ?Vitals:  ? 07/26/21 1421  ?BP: 118/80  ?Pulse: 96  ? ? ?Physical Exam Well-developed well-nourished older white female in no acute distress.  Accompanied by her husband  Weight 174, BMI 28.9 ? ?HEENT; nontraumatic normocephalic, EOMI, PE R LA, sclera anicteric. ?Oropharynx; not examined today ?Neck; supple, no JVD ?Cardiovascular; irregular rate and rhythm with S1-S2, no murmur rub or gallop ?Pulmonary; Clear bilaterally ?Abdomen; soft, nontender, nondistended, no palpable mass or hepatosplenomegaly, bowel sounds are active ?Rectal; not done today ?Skin; benign exam, no jaundice rash or appreciable lesions ?Extremities; no clubbing cyanosis or edema skin warm and dry ?Neuro/Psych; alert and oriented x4, grossly nonfocal mood and affect appropriate  ? ? ? ?   ?Assessment & Plan:  ? ?#18 71 year old white female due for follow-up screening colonoscopy, currently asymptomatic. ?Last colonoscopy December 2012 done for screening and negative. ? ?#2 chronic anticoagulation-on Coumadin ?#3 atrial fibrillation ?#4 history of congestive heart failure-most recent echo 2020 EF 55 to 60%, no aortic stenosis ?#5 history of mitral valve disease status post mitral valve replacement/bioprosthetic valve 2019 ?#6 rheumatic heart disease ? ?Plan; patient will be scheduled for colonoscopy with Dr. Carlean Kemp.  Procedure discussed in detail with the patient including indications risk and benefits and she is agreeable to proceed. ?Coumadin will need to be held for 5 days prior to procedure.  We will communicate with her cardiologist/Dr. Stanford Kemp to assure this is reasonable for this patient. ? ?Lorraine Ferguson PA-C ?07/26/2021 ? ? ?Cc: Lorraine Noon, MD ?  ?

## 2021-07-26 NOTE — Telephone Encounter (Signed)
[facility] Pre-operative Risk Assessment  ?   ?Request for surgical clearance:     Endoscopy Procedure ? ?What type of surgery is being performed?     Colonoscopy ? ?When is this surgery scheduled?     09/26/2021 ? ?What type of clearance is required ?   Pharmacy ? ?Are there any medications that need to be held prior to surgery and how long? Coumadin starting 5 days prior. If patient needs Lovenox bridge please contact patient on bridge. ? ?Practice name and name of physician performing surgery?      [facility] ? ?What is your office phone and fax number?      Phone- [phone]  Fax- [phone] ? ?Anesthesia type (None, local, MAC, general) ?       [facility] ? ?

## 2021-07-28 NOTE — Telephone Encounter (Signed)
Left message for pt to call the office to schedule an appt in the office for pre op clearance and lab work. Pt can see Dr. Stanford Breed or APP.  ?

## 2021-07-28 NOTE — Telephone Encounter (Signed)
Patient has not had any CBC or BMET since 2019, will need repeat labs to determine warfarin hold  ?

## 2021-07-28 NOTE — Telephone Encounter (Signed)
Patient will need CBC and BMP labs prior to visit.  Once labs are completed pharmacy will be able to make/provide recommendations on Coumadin hold.  Please order CBC and BMP.  Thank you. ? ?Primary Cardiologist:Lorraine Stanford Breed, MD ? ?Chart reviewed as part of pre-operative protocol coverage. Because of Lorraine Kemp's past medical history and time since last visit, he/she will require a follow-up visit in order to better assess preoperative cardiovascular risk. ? ?Pre-op covering staff: ?- Please schedule appointment and call patient to inform them. ?- Please contact requesting surgeon's office via preferred method (i.e, phone, fax) to inform them of need for appointment prior to surgery. ? ?If applicable, this message will also be routed to pharmacy pool and/or primary cardiologist for input on holding anticoagulant/antiplatelet agent as requested below so that this information is available at time of patient's appointment.  ? ?Deberah Pelton, NP  ?07/28/2021, 7:25 AM  ? ?

## 2021-07-31 ENCOUNTER — Other Ambulatory Visit: Payer: Self-pay | Admitting: *Deleted

## 2021-07-31 ENCOUNTER — Encounter: Payer: Self-pay | Admitting: Cardiology

## 2021-07-31 DIAGNOSIS — Z01818 Encounter for other preprocedural examination: Secondary | ICD-10-CM

## 2021-07-31 DIAGNOSIS — I4811 Longstanding persistent atrial fibrillation: Secondary | ICD-10-CM

## 2021-07-31 DIAGNOSIS — I5032 Chronic diastolic (congestive) heart failure: Secondary | ICD-10-CM

## 2021-07-31 NOTE — Telephone Encounter (Signed)
I have left a message for the pt letting her know that she will need lab work for pre op clearance. Left message if she would be able to get the lab work done today at the [place] location where she see's Dr. Stanford Breed. BMET, CBC needed. Left message to please call back and ask to with Arbie Cookey with the pre op team to let me know if she is getting lab work today. I will send FYI to Laurann Montana, NP as Juluis Rainier.  ?

## 2021-07-31 NOTE — Telephone Encounter (Signed)
Pt scheduled for tomorrow 3/28 w/ Laurann Montana. No order for the labs yet for pt ?

## 2021-08-01 ENCOUNTER — Other Ambulatory Visit: Payer: Self-pay

## 2021-08-01 ENCOUNTER — Ambulatory Visit (HOSPITAL_BASED_OUTPATIENT_CLINIC_OR_DEPARTMENT_OTHER): Payer: Medicare HMO | Admitting: Family

## 2021-08-01 ENCOUNTER — Encounter (HOSPITAL_BASED_OUTPATIENT_CLINIC_OR_DEPARTMENT_OTHER): Payer: Self-pay | Admitting: Family

## 2021-08-01 VITALS — BP 110/78 | HR 90 | Ht 65.0 in | Wt 172.3 lb

## 2021-08-01 DIAGNOSIS — I25118 Atherosclerotic heart disease of native coronary artery with other forms of angina pectoris: Secondary | ICD-10-CM | POA: Diagnosis not present

## 2021-08-01 DIAGNOSIS — E785 Hyperlipidemia, unspecified: Secondary | ICD-10-CM

## 2021-08-01 DIAGNOSIS — Z9889 Other specified postprocedural states: Secondary | ICD-10-CM | POA: Diagnosis not present

## 2021-08-01 DIAGNOSIS — I4811 Longstanding persistent atrial fibrillation: Secondary | ICD-10-CM | POA: Diagnosis not present

## 2021-08-01 DIAGNOSIS — I4892 Unspecified atrial flutter: Secondary | ICD-10-CM

## 2021-08-01 DIAGNOSIS — D6859 Other primary thrombophilia: Secondary | ICD-10-CM

## 2021-08-01 DIAGNOSIS — I6523 Occlusion and stenosis of bilateral carotid arteries: Secondary | ICD-10-CM

## 2021-08-01 LAB — BASIC METABOLIC PANEL
BUN/Creatinine Ratio: 18 (ref 12–28)
BUN: 16 mg/dL (ref 8–27)
CO2: 23 mmol/L (ref 20–29)
Calcium: 10.1 mg/dL (ref 8.7–10.3)
Chloride: 103 mmol/L (ref 96–106)
Creatinine, Ser: 0.91 mg/dL (ref 0.57–1.00)
Glucose: 160 mg/dL — ABNORMAL HIGH (ref 70–99)
Potassium: 4.7 mmol/L (ref 3.5–5.2)
Sodium: 140 mmol/L (ref 134–144)
eGFR: 67 mL/min/{1.73_m2} (ref >59)

## 2021-08-01 LAB — CBC
Hematocrit: 43 % (ref 34.0–46.6)
Hemoglobin: 14.6 g/dL (ref 11.1–15.9)
MCH: 29.4 pg (ref 26.6–33.0)
MCHC: 34 g/dL (ref 31.5–35.7)
MCV: 87 fL (ref 79–97)
Platelets: 210 10*3/uL (ref 150–450)
RBC: 4.97 x10E6/uL (ref 3.77–5.28)
RDW: 13.3 % (ref 11.7–15.4)
WBC: 6.9 10*3/uL (ref 3.4–10.8)

## 2021-08-01 MED ORDER — METOPROLOL SUCCINATE ER 25 MG PO TB24: 25.0000 mg | ORAL_TABLET | Freq: Every day | ORAL | 5 refills | Status: DC

## 2021-08-01 NOTE — Progress Notes (Signed)
? ?Office Visit  ?  ?Patient Name: DEMARI Kemp ?Date of Encounter: 08/01/2021 ? ?PCP:  Chesley Noon, MD ?  ?[facility]  ?Cardiologist:  Kirk Ruths, MD  ?Advanced Practice Provider:  No care team member to display ?Electrophysiologist:  None  ?   ? ?Chief Complaint  ?  ?Lorraine Kemp is a 71 y.o. female with a hx of  rheumatic heart disease and mitral stenosis s/p mitral valve replacement, atrial fibrillation s/p Maze procedure and left atrial appendage clipping, carotid artery disease , mild nonobstructive coronary artery disease presents today for preop clearance.  ? ?Past Medical History  ?  ?Past Medical History:  ?Diagnosis Date  ? Atrial fibrillation ([place])   ? Chronic diastolic congestive heart failure ([place])   ? Complication of anesthesia   ? Depression   ? Hematuria   ? workup by Dr. Diona Fanti  ? Longstanding persistent atrial fibrillation ([place])   ? MITRAL REGURGITATION   ? Mitral stenosis   ? PONV (postoperative nausea and vomiting)   ? RHEUMATIC HEART DISEASE   ? S/P Minimally invasive maze operation for atrial fibrillation 03/28/2018  ? Complete bilateral atrial lesion set using crythermy and bipolar radiofrequency ablation with clipping of LA appendage via right mini thoracotomy  ? S/P minimally invasive mitral valve replacement with bioprosthetic valve 03/28/2018  ? 31 mm [facility] Mitral stented bovine pericardial tissue valve   ? Thyroid nodule   ? Wrist fracture, left 2019  ? Dr. Sharol Given  ? ?Past Surgical History:  ?Procedure Laterality Date  ? ABDOMINAL AORTOGRAM N/A 03/04/2018  ? Procedure: ABDOMINAL AORTOGRAM;  Surgeon: Jettie Booze, MD;  Location: [place] [facility];  Service: Cardiovascular;  Laterality: N/A;  ? CARDIOVERSION    ? 2000  ? CESAREAN SECTION    ? COLONOSCOPY    ? MINIMALLY INVASIVE MAZE PROCEDURE N/A 03/28/2018  ? Procedure: MINIMALLY INVASIVE MAZE PROCEDURE;  Surgeon: Rexene Alberts, MD;  Location: [place];  Service: Open Heart Surgery;   Laterality: N/A;  ? MITRAL VALVE REPLACEMENT Right 03/28/2018  ? Procedure: MINIMALLY INVASIVE MITRAL VALVE (MV) REPLACEMENT. Magna Mitral Ease 40m.;  Surgeon: ORexene Alberts MD;  Location: [facility]  Service: Open Heart Surgery;  Laterality: Right;  glutaraldehyde  ? PERCUTANEOUS BALLOON VALVULOPLASTY  10/1998  ? [facility] ? RIGHT/LEFT HEART CATH AND CORONARY ANGIOGRAPHY N/A 03/04/2018  ? Procedure: RIGHT/LEFT HEART CATH AND CORONARY ANGIOGRAPHY;  Surgeon: VJettie Booze MD;  [facility];  Service: Cardiovascular;  Laterality: N/A;  ? screw in ankles  1989  ? right  ? TEE WITHOUT CARDIOVERSION N/A 12/31/2017  ? Procedure: TRANSESOPHAGEAL ECHOCARDIOGRAM (TEE);  Surgeon: CLelon Perla MD;  [facility];  Service: Cardiovascular;  Laterality: N/A;  ? TEE WITHOUT CARDIOVERSION N/A 03/28/2018  ? Procedure: TRANSESOPHAGEAL ECHOCARDIOGRAM (TEE);  Surgeon: ORexene Alberts MD;  [place] [facility]  Service: Open Heart Surgery;  Laterality: N/A;  ? THYROIDECTOMY  05/30/2012  ? Procedure: THYROIDECTOMY;  Surgeon: TEarnstine Regal MD;  Location: [facility];  Service: General;  Laterality: N/A;  total thyroidectomy  ? TONSILLECTOMY  1956  ? TUBAL LIGATION    ? ? ?Allergies ? ?No Known Allergies ? ?History of Present Illness  ?  ?Lorraine Kemp a 71y.o. female with a hx of rheumatic heart disease and mitral stenosis s/p mitral valve replacement, atrial fibrillation s/p Maze procedure and left atrial appendage clipping, carotid artery disease ,  mild nonobstructive coronary artery disease last seen 01/2019 by Dr. Stanford Breed. ? ?Prior balloon valvuloplasty at [facility] in June 2000.  TEE August 2019 with normal LVEF, biatrial enlargement, mild RV enlargement, severe MS, mild to moderate MR, mild TR.  Cardiac catheterization October 2019 with nonobstructive coronary disease (mid LAD, proximal LAD 25% stenosed).  Carotid Dopplers November 2019 with bilateral 1 just a 9% stenosis.  She  underwent minimally invasive mitral valve replacement with Edwards magna mitral stented bovine pericardial tissue valve and minimally invasive maze procedure November 2019 by Dr. Zenia Resides with clipping of left atrial appendage.  Echo February 2020 with normal LVEF, biatrial enlargement, bioprosthetic mitral valve with mean gradient 4 mmHg and trace MR.  She was last seen September 2020 by Dr. Stanford Breed doing well from a cardiac perspective and no changes were made at that time.  Her anticoagulation was continued due to longstanding history of atrial fibrillation prior to surgery and concern for high risk of recurrence. ? ?Presents today for follow up. Shares with me that she enjoys volunteering at [employer] school, spending time with her grandchildren (42, 32, 40, and 66 years old). She walks 30 minutes 3-4 times per week for exercise. Monitors rhythm periodically with smart watch and no notation of atrial fib. Reports no shortness of breath nor dyspnea on exertion. Reports no chest pain, pressure, or tightness. No edema, orthopnea, PND. Reports no palpitations.  She is pending clearance for routine colonoscopy.  ? ?EKGs/Labs/Other Studies Reviewed:  ? ?The following studies were reviewed today: ? ?Echo 06/2018 ?1. The left ventricle has normal systolic function, with an ejection  ?fraction of 55-60%. The cavity size was normal. There is moderately  ?increased left ventricular wall thickness. Left ventricular diastology  ?could not be evaluated due to mitral valve  ?replacement/repair.  ? 2. The right ventricle has normal systolic function. The cavity was  ?mildly enlarged. There is no increase in right ventricular wall thickness.  ? 3. Left atrial size was severely dilated.  ? 4. Right atrial size was mildly dilated.  ? 5. A 76m Edwards Magna bioprosthetic valve is present in the mitral  ?position. Normal mitral valve prosthesis.  ? 6. The pericardial effusion is circumferential.  ? 7. Trivial pericardial  effusion is present.  ? 8. No evidence of mitral valve stenosis.  ? 9. The aortic valve is tricuspid.  ?10. The aortic root and ascending aorta are normal in size and structure.  ?11. The interatrial septum was not well visualized.  ?12. When compared to the prior study: 12/12/17 EF 55-60%. Severe MS 166mg  ?mean, 169m peak. PA 43 - compared to this study, the mitral valve has  ?been replaced.  ? ?Carotid Doppler 03/2018 ?Summary:  ?Right Carotid: Velocities in the right ICA are consistent with a 1-39%  ?stenosis.  ? ?Left Carotid: Velocities in the left ICA are consistent with a 1-39%  ?stenosis.  ?Vertebrals: Bilateral vertebral arteries demonstrate antegrade flow  ? ?R/LHC  02/2018 ?Mid LAD lesion is 25% stenosed. ?Prox LAD lesion is 25% stenosed. ?LV end diastolic pressure is normal. ?There is no aortic valve stenosis. ?Nonobstructive coronary artery disease. ?CO 5.37 L/min; CI 2.94; Ao sat 98%, PA sat 73%; PA pressure 33/15, mean PA 22 mm Hg; mean PCWP 16 mm Hg ?No significant aortoiliac disease noted on abdominal aortogram. ?Mitral valve area calculated: 1.76 cm2 ?  ?Continue with plan for mitral valve surgery. ?  ?EKG:  EKG is ordered today.  The ekg ordered today demonstrates  atrial flutter 90 bpm with occasional PVC and no acute ST/T wave changes. ? ?Recent Labs: ?07/31/2021: BUN 16; Creatinine, Ser 0.91; Hemoglobin 14.6; Platelets 210; Potassium 4.7; Sodium 140  ?Recent Lipid Panel ?   ?Component Value Date/Time  ? CHOL 178 04/06/2014 1132  ? TRIG 60 04/06/2014 1132  ? HDL 59 04/06/2014 1132  ? CHOLHDL 3.0 04/06/2014 1132  ? VLDL 12 04/06/2014 1132  ? LDLCALC 107 (H) 04/06/2014 1132  ? ? ?Risk Assessment/Calculations:  ? ?CHA2DS2-VASc Score = 3  ? This indicates a 3.2% annual risk of stroke. ?The patient's score is based upon: ?CHF History: 1 ?HTN History: 0 ?Diabetes History: 0 ?Stroke History: 0 ?Vascular Disease History: 0 ?Age Score: 1 ?Gender Score: 1 ?  ?Home Medications  ? ?Current Meds  ?Medication  Sig  ? amoxicillin (AMOXIL) 500 MG capsule Take 2,000 mg by mouth See admin instructions. Take 2000 mg by mouth 1 hour prior to dental appointment  ? DULoxetine (CYMBALTA) 30 MG capsule Take 1 capsule by mouth

## 2021-08-01 NOTE — Patient Instructions (Addendum)
Medication Instructions:  ?Your physician has recommended you make the following change in your medication:  ? ?START Metoprolol Succinate one '25mg'$  tablet daily ? ?*If you need a refill on your cardiac medications before your next appointment, please call your pharmacy* ? ? ?Lab Work: ?None ordered today.  ?Recent blood work showed no significant anemia and normal kidney function.  ? ?Testing/Procedures: ?Your EKG today showed atrial flutter. This is an irregular heart rhythm that is a little different than atrial fibrillation.  ? ? ?Follow-Up: ?At [facility], you and your health needs are our priority.  As part of our continuing mission to provide you with exceptional heart care, we have created designated Provider Care Teams.  These Care Teams include your primary Cardiologist (physician) and Advanced Practice Providers (APPs -  Physician Assistants and Nurse Practitioners) who all work together to provide you with the care you need, when you need it. ? ?We recommend signing up for the patient portal called "MyChart".  Sign up information is provided on this After Visit Summary.  MyChart is used to connect with patients for Virtual Visits (Telemedicine).  Patients are able to view lab/test results, encounter notes, upcoming appointments, etc.  Non-urgent messages can be sent to your provider as well.   ?To learn more about what you can do with MyChart, go to [email or web address].   ? ?Your next appointment:   ?1 month(s) ? ?The format for your next appointment:   ?In Person ? ?Provider:   ?Kirk Ruths, MD or Loel Dubonnet, NP   ? ?*we will reach out to Dr. Jacalyn Lefevre nurse to assist with scheduling* ? ?Other Instructions ? ?Atrial Flutter ?Atrial flutter is a type of abnormal heart rhythm (arrhythmia). The heart has an electrical system that tells it how to beat. In atrial flutter, the signals move rapidly in the top chambers of the heart (the atria). This makes your heart beat very fast. Atrial  flutter can come and go, or it can be permanent. ?The goal of treatment is to prevent blood clots from forming, control your heart rate, or restore your heartbeat to a normal rhythm. If this condition is not treated, it can cause serious problems, such as a weakened heart muscle (cardiomyopathy) or a stroke. ?What are the causes? ?This condition is often caused by conditions that damage the heart's electrical system. These include: ?Heart conditions and heart surgery. These include heart attacks and open-heart surgery. ?Lung problems, such as COPD or a blood clot in the lung (pulmonary embolism, or PE). ?Poorly controlled high blood pressure (hypertension). ?Overactive thyroid (hyperthyroidism). ?Diabetes. ?In some cases, the cause of this condition is not known. ?What increases the risk? ?You are more likely to develop this condition if: ?You are an elderly adult. ?You are a man. ?You are overweight (obese). ?You have obstructive sleep apnea. ?You have a family history of atrial flutter. ?You have diabetes. ?You drink a lot of alcohol, especially binge drinking. ?You use drugs, including cannabis. ?You smoke. ?What are the signs or symptoms? ?Symptoms of this condition include: ?A feeling that your heart is pounding or racing (palpitations). ?Shortness of breath. ?Chest pain. ?Feeling dizzy or light-headed. ?Fainting. ?Low blood pressure (hypotension). ?Fatigue. ?Tiring easily during exercise or activity. ?In some cases, there are no symptoms. ?How is this diagnosed? ?This condition may be diagnosed with: ?An electrocardiogram (ECG) to check electrical signals of the heart. ?An ambulatory cardiac monitor to record your heart's activity for a few days. ?An echocardiogram to create pictures of your  heart. ?A transesophageal echocardiogram (TEE) to create even better pictures of your heart. ?A stress test to check your blood supply while you exercise. ?Imaging tests, such as a CT scan or chest X-ray. ?Blood  tests. ?How is this treated? ?Treatment depends on underlying conditions and how you feel when you experience atrial flutter. This condition may be treated with: ?Medicines to prevent blood clots or to treat heart rate or heart rhythm problems. ?Electrical cardioversion to reset the heart's rhythm. ?Ablation to remove the heart tissue that sends abnormal signals. ?Left atrial appendage closure to seal the area where blood clots can form. ?In some cases, underlying conditions will be treated. ?Follow these instructions at home: ?Medicines ?Take over-the-counter and prescription medicines only as told by your health care provider. ?Do not take any new medicines without talking to your health care provider. ?If you are taking blood thinners: ?Talk with your health care provider before you take any medicines that contain aspirin or NSAIDs, such as ibuprofen. These medicines increase your risk for dangerous bleeding. ?Take your medicine exactly as told, at the same time every day. ?Avoid activities that could cause injury or bruising, and follow instructions about how to prevent falls. ?Wear a medical alert bracelet or carry a card that lists what medicines you take. ?Lifestyle ?Eat heart-healthy foods. Talk with a dietitian to make an eating plan that is right for you. ?Do not use any products that contain nicotine or tobacco, such as cigarettes, e-cigarettes, and chewing tobacco. If you need help quitting, ask your health care provider. ?Do not drink alcohol. ?Do not use drugs, including cannabis. ?Lose weight if you are overweight or obese. ?Exercise regularly as instructed by your health care provider. ?General instructions ?Do not use diet pills unless your health care provider approves. Diet pills may make heart problems worse. ?If you have obstructive sleep apnea, manage your condition as told by your health care provider. ?Keep all follow-up visits as told by your health care provider. This is important. ?Contact  a health care provider if you: ?Notice a change in the rate, rhythm, or strength of your heartbeat. ?Are taking a blood thinner and you notice more bruising. ?Have a sudden change in weight. ?Tire more easily when you exercise or do heavy work. ?Get help right away if you have: ?Pain or pressure in your chest. ?Shortness of breath. ?Fainting. ?Increasing sweating with no known cause. ?Side effects of blood thinners, such as blood in your vomit, stool, or urine, or bleeding that cannot stop. ?Any symptoms of a stroke. "BE FAST" is an easy way to remember the main warning signs of a stroke: ?B - Balance. Signs are dizziness, sudden trouble walking, or loss of balance. ?E - Eyes. Signs are trouble seeing or a sudden change in vision. ?F - Face. Signs are sudden weakness or numbness of the face, or the face or eyelid drooping on one side. ?A - Arms. Signs are weakness or numbness in an arm. This happens suddenly and usually on one side of the body. ?S - Speech. Signs are sudden trouble speaking, slurred speech, or trouble understanding what people say. ?T - Time. Time to call emergency services. Write down what time symptoms started. ?Other signs of a stroke, such as: ?A sudden, severe headache with no known cause. ?Nausea or vomiting. ?Seizure. ?These symptoms may represent a serious problem that is an emergency. Do not wait to see if the symptoms will go away. Get medical help right away. Call your local emergency  services (911 in the [place] Do not drive yourself to the hospital. ?Summary ?Atrial flutter is an abnormal heart rhythm that can give you symptoms of palpitations, shortness of breath, or fatigue. ?Atrial flutter is often treated with medicines to keep your heart in a normal rhythm and to prevent a stroke. ?Get help right away if you cannot catch your breath, or have chest pain or pressure. ?Get help right away if you have signs or symptoms of a stroke. ?This information is not intended to replace advice  given to you by your health care provider. Make sure you discuss any questions you have with your health care provider. ?Document Revised: 10/15/2018 Document Reviewed: 10/15/2018 ?Elsevier Patient Education ? 2022 [place]

## 2021-08-01 NOTE — Telephone Encounter (Signed)
FYI for OV today! ?

## 2021-08-01 NOTE — Telephone Encounter (Signed)
Patient with diagnosis of atrial fibrillation on warfarin for anticoagulation.   ? ?Procedure: colonoscopy ?Date of procedure: 09/26/21 ? ? ?CHA2DS2-VASc Score = 3  ? This indicates a 3.2% annual risk of stroke. ?The patient's score is based upon: ?CHF History: 1 ?HTN History: 0 ?Diabetes History: 0 ?Stroke History: 0 ?Vascular Disease History: 0 ?Age Score: 1 ?Gender Score: 1 ? ? ?CrCl 57 ?Platelet count 165 ? ?Per office protocol, patient can hold warfarin for 5 days prior to procedure.   ?Patient will not need bridging with Lovenox (enoxaparin) around procedure. ? ? ? ?

## 2021-08-07 NOTE — Progress Notes (Signed)
? ? ? ? ?HPI:FU atrial fibrillation, MS and rheumatic heart disease. She has had prior balloon valvuloplasty at [facility] in June 2000. TEE August 2019 showed normal LV function, biatrial enlargement, mild right ventricular enlargement, severe mitral stenosis with mitral valve area of 1 cm?, mild to moderate mitral regurgitation and mild tricuspid regurgitation. Cardiac catheterization October 2019 showed nonobstructive coronary disease. Carotid Dopplers November 2019 showed 1 to 39% bilateral stenosis.  Patient had minimally invasive mitral valve replacement with an Edwards magna mitral stented bovine pericardial tissue valve and minimally invasive maze procedure 11/19. Echocardiogram February 2020 showed normal LV function, biatrial enlargement, bioprosthetic mitral valve with mean gradient 4 mmHg and trace MR. Patient seen recently for preoperative evaluation prior to colonoscopy and noted to be in asymptomatic atrial flutter.  She was placed on Toprol and anticoagulation was continued.  Since I last saw her, she denies increased dyspnea, chest pain, palpitations or syncope.  No pedal edema. ? ?Current Outpatient Medications  ?Medication Sig Dispense Refill  ? amoxicillin (AMOXIL) 500 MG capsule Take 2,000 mg by mouth See admin instructions. Take 2000 mg by mouth 1 hour prior to dental appointment  2  ? DULoxetine (CYMBALTA) 30 MG capsule Take 1 capsule by mouth daily.    ? levothyroxine (SYNTHROID) 112 MCG tablet Take 112 mcg by mouth daily.    ? metoprolol succinate (TOPROL XL) 25 MG 24 hr tablet Take 1 tablet (25 mg total) by mouth daily. 30 tablet 5  ? Multiple Minerals-Vitamins (CALCIUM-MAGNESIUM-ZINC-D3 PO) Take 1 tablet by mouth daily.    ? Multiple Vitamins-Minerals (MULTIVITAMIN WITH MINERALS) tablet Take 1 tablet by mouth daily.     ? warfarin (COUMADIN) 1 MG tablet Take 1 tablet (1 mg total) by mouth daily at 6 PM. As directed by the coumadin clinic (Patient taking differently: Take 3 mg by  mouth daily at 6 PM. As directed by the coumadin clinic) 100 tablet 1  ? ?No current facility-administered medications for this visit.  ? ? ? ?Past Medical History:  ?Diagnosis Date  ? Atrial fibrillation ([place])   ? Chronic diastolic congestive heart failure ([place])   ? Complication of anesthesia   ? Depression   ? Hematuria   ? workup by Dr. Diona Fanti  ? Longstanding persistent atrial fibrillation ([place])   ? MITRAL REGURGITATION   ? Mitral stenosis   ? PONV (postoperative nausea and vomiting)   ? RHEUMATIC HEART DISEASE   ? S/P Minimally invasive maze operation for atrial fibrillation 03/28/2018  ? Complete bilateral atrial lesion set using crythermy and bipolar radiofrequency ablation with clipping of LA appendage via right mini thoracotomy  ? S/P minimally invasive mitral valve replacement with bioprosthetic valve 03/28/2018  ? 31 mm [facility] Mitral stented bovine pericardial tissue valve   ? Thyroid nodule   ? Wrist fracture, left 2019  ? Dr. Sharol Given  ? ? ?Past Surgical History:  ?Procedure Laterality Date  ? ABDOMINAL AORTOGRAM N/A 03/04/2018  ? Procedure: ABDOMINAL AORTOGRAM;  Surgeon: Jettie Booze, MD;  Location: [place] [facility];  Service: Cardiovascular;  Laterality: N/A;  ? CARDIOVERSION    ? 2000  ? CESAREAN SECTION    ? COLONOSCOPY    ? MINIMALLY INVASIVE MAZE PROCEDURE N/A 03/28/2018  ? Procedure: MINIMALLY INVASIVE MAZE PROCEDURE;  Surgeon: Rexene Alberts, MD;  Location: [place];  Service: Open Heart Surgery;  Laterality: N/A;  ? MITRAL VALVE REPLACEMENT Right 03/28/2018  ? Procedure: MINIMALLY INVASIVE MITRAL VALVE (MV) REPLACEMENT. Magna Mitral  Ease 68m.;  Surgeon: ORexene Alberts MD;  [place] [facility]  Service: Open Heart Surgery;  Laterality: Right;  glutaraldehyde  ? PERCUTANEOUS BALLOON VALVULOPLASTY  10/1998  ? [place] ? RIGHT/LEFT HEART CATH AND CORONARY ANGIOGRAPHY N/A 03/04/2018  ? Procedure: RIGHT/LEFT HEART CATH AND CORONARY ANGIOGRAPHY;  Surgeon: VJettie Booze  MD;  [facility];  Service: Cardiovascular;  Laterality: N/A;  ? screw in ankles  1989  ? right  ? TEE WITHOUT CARDIOVERSION N/A 12/31/2017  ? Procedure: TRANSESOPHAGEAL ECHOCARDIOGRAM (TEE);  Surgeon: CLelon Perla MD;  [facility];  Service: Cardiovascular;  Laterality: N/A;  ? TEE WITHOUT CARDIOVERSION N/A 03/28/2018  ? Procedure: TRANSESOPHAGEAL ECHOCARDIOGRAM (TEE);  Surgeon: ORexene Alberts MD;  [place] [facility]  Service: Open Heart Surgery;  Laterality: N/A;  ? THYROIDECTOMY  05/30/2012  ? Procedure: THYROIDECTOMY;  Surgeon: TEarnstine Regal MD;  Location[facility];  Service: General;  Laterality: N/A;  total thyroidectomy  ? TONSILLECTOMY  1956  ? TUBAL LIGATION    ? ? ?Social History  ? ?Socioeconomic History  ? Marital status: Married  ?  Spouse name: Not on file  ? Number of children: 2  ? Years of education: Not on file  ? Highest education level: Not on file  ?Occupational History  ? Occupation: bAirline pilot ?  Employer: [employer] ?Tobacco Use  ? Smoking status: Never  ? Smokeless tobacco: Never  ?Vaping Use  ? Vaping Use: Never used  ?Substance and Sexual Activity  ? Alcohol use: Yes  ?  Alcohol/week: 1.0 standard drink  ?  Types: 1 Glasses of wine per week  ?  Comment: occas glass of wine weekly  ? Drug use: No  ? Sexual activity: Yes  ?  Partners: Male  ?  Birth control/protection: Surgical, Post-menopausal  ?  Comment: BTL  ?Other Topics Concern  ? Not on file  ?Social History Narrative  ? Not on file  ? ?Social Determinants of Health  ? ?Financial Resource Strain: Not on file  ?Food Insecurity: Not on file  ?Transportation Needs: Not on file  ?Physical Activity: Not on file  ?Stress: Not on file  ?Social Connections: Not on file  ?Intimate Partner Violence: Not on file  ? ? ?Family History  ?Problem Relation Age of Onset  ? Lung cancer Father   ? Cancer Father   ?     lung  ? Heart disease Mother   ?     S/P CABG  ? Cancer Brother   ?     brain  ?  Hypertension Sister   ? Heart disease Maternal Grandmother   ? ? ?ROS: no fevers or chills, productive cough, hemoptysis, dysphasia, odynophagia, melena, hematochezia, dysuria, hematuria, rash, seizure activity, orthopnea, PND, pedal edema, claudication. Remaining systems are negative. ? ?Physical Exam: ?Well-developed well-nourished in no acute distress.  ?Skin is warm and dry.  ?HEENT is normal.  ?Neck is supple.  ?Chest is clear to auscultation with normal expansion.  ?Cardiovascular exam is irregular ?Abdominal exam nontender or distended. No masses palpated. ?Extremities show no edema. ?neuro grossly intact ? ?ECG-atrial fibrillation at a rate of 69, normal axis, cannot rule out septal infarct.  Personally reviewed ? ?A/P ? ?1 post mitral valve replacement-continue SBE prophylaxis.  Most recent echocardiogram showed normally functioning valve. ? ?2 paroxysmal atrial fibrillation/flutter-patient had previous Maze procedure at the time of her mitral valve replacement.  She remains in atrial fibrillation  today.  Her heart rate is controlled.  She is asymptomatic.  We had a long discussion today concerning rate control versus rhythm control.  I think she will be at risk for recurrent atrial arrhythmias in the future given her history of mitral stenosis and left atrial enlargement.  I also think that given she is asymptomatic we should plan rate control long-term and not rhythm control.  She is in agreement.  We will continue Toprol.  She would like to transition to a DOAC.  We will continue Coumadin until her upcoming colonoscopy.  Discontinue Coumadin 5 days prior to that procedure and begin apixaban 5 mg twice daily 1 day after if okay with gastroenterology. ? ?3 history of palpitations ? ?4 hyperlipidemia-per primary care. ? ?Kirk Ruths, MD ? ? ? ?

## 2021-08-08 ENCOUNTER — Ambulatory Visit
Admission: RE | Admit: 2021-08-08 | Discharge: 2021-08-08 | Disposition: A | Discharge status: Home / Self Care | Payer: Medicare HMO | Source: Ambulatory Visit | Attending: Physician Assistant | Admitting: Physician Assistant

## 2021-08-08 DIAGNOSIS — M8588 Other specified disorders of bone density and structure, other site: Secondary | ICD-10-CM

## 2021-08-10 ENCOUNTER — Ambulatory Visit (INDEPENDENT_AMBULATORY_CARE_PROVIDER_SITE_OTHER): Payer: Medicare HMO

## 2021-08-10 DIAGNOSIS — I4892 Unspecified atrial flutter: Secondary | ICD-10-CM | POA: Diagnosis not present

## 2021-08-10 LAB — ECHOCARDIOGRAM COMPLETE
AR max vel: 1.61 cm2
AV Area VTI: 1.36 cm2
AV Area mean vel: 1.43 cm2
AV Mean grad: 5 mmHg
AV Peak grad: 9.2 mmHg
Ao pk vel: 1.52 m/s
Area-P 1/2: 4.39 cm2
Calc EF: 60.1 %
MV VTI: 1.37 cm2
S' Lateral: 2.92 cm
Single Plane A2C EF: 57.3 %
Single Plane A4C EF: 65.2 %

## 2021-08-21 ENCOUNTER — Encounter: Payer: Self-pay | Admitting: Cardiology

## 2021-08-21 ENCOUNTER — Ambulatory Visit: Payer: Medicare HMO | Admitting: Cardiology

## 2021-08-21 VITALS — BP 134/80 | HR 69 | Ht 65.0 in | Wt 167.0 lb

## 2021-08-21 DIAGNOSIS — I25118 Atherosclerotic heart disease of native coronary artery with other forms of angina pectoris: Secondary | ICD-10-CM | POA: Diagnosis not present

## 2021-08-21 DIAGNOSIS — I4892 Unspecified atrial flutter: Secondary | ICD-10-CM | POA: Diagnosis not present

## 2021-08-21 DIAGNOSIS — E785 Hyperlipidemia, unspecified: Secondary | ICD-10-CM | POA: Diagnosis not present

## 2021-08-21 DIAGNOSIS — Z9889 Other specified postprocedural states: Secondary | ICD-10-CM | POA: Diagnosis not present

## 2021-08-21 MED ORDER — APIXABAN 5 MG PO TABS: 5.0000 mg | ORAL_TABLET | Freq: Two times a day (BID) | ORAL | 6 refills | Status: DC

## 2021-08-21 NOTE — Patient Instructions (Signed)
Medication Instructions:  ? ?STOP WARFARIN 5 DAYS PRIOR TO COLONOSCOPY AND THEN START ELQUIS 5 MG TWICE DAILY THE DAY AFTER COLONOSCOPY IF OKAY WITH GI ? ?*If you need a refill on your cardiac medications before your next appointment, please call your pharmacy* ? ? ?Follow-Up: ?At [facility], you and your health needs are our priority.  As part of our continuing mission to provide you with exceptional heart care, we have created designated Provider Care Teams.  These Care Teams include your primary Cardiologist (physician) and Advanced Practice Providers (APPs -  Physician Assistants and Nurse Practitioners) who all work together to provide you with the care you need, when you need it. ? ?We recommend signing up for the patient portal called "MyChart".  Sign up information is provided on this After Visit Summary.  MyChart is used to connect with patients for Virtual Visits (Telemedicine).  Patients are able to view lab/test results, encounter notes, upcoming appointments, etc.  Non-urgent messages can be sent to your provider as well.   ?To learn more about what you can do with MyChart, go to [email or web address].   ? ?Your next appointment:   ?6 month(s) ? ?The format for your next appointment:   ?In Person ? ?Provider:   ?Kirk Ruths, MD  ? ? ? ? ?Important Information About Sugar ? ? ? ? ?  ?

## 2021-09-19 ENCOUNTER — Encounter: Payer: Self-pay | Admitting: Internal Medicine

## 2021-09-26 ENCOUNTER — Ambulatory Visit (AMBULATORY_SURGERY_CENTER): Payer: Medicare HMO | Admitting: Internal Medicine

## 2021-09-26 ENCOUNTER — Encounter: Payer: Self-pay | Admitting: Internal Medicine

## 2021-09-26 VITALS — BP 131/59 | HR 86 | Temp 97.5°F | Resp 18 | Ht 65.0 in | Wt 174.0 lb

## 2021-09-26 DIAGNOSIS — K635 Polyp of colon: Secondary | ICD-10-CM | POA: Diagnosis not present

## 2021-09-26 DIAGNOSIS — Z1211 Encounter for screening for malignant neoplasm of colon: Secondary | ICD-10-CM | POA: Diagnosis present

## 2021-09-26 DIAGNOSIS — D125 Benign neoplasm of sigmoid colon: Secondary | ICD-10-CM

## 2021-09-26 MED ORDER — SODIUM CHLORIDE 0.9 % IV SOLN: 500.0000 mL | Freq: Once | INTRAVENOUS | Status: DC

## 2021-09-26 NOTE — Progress Notes (Signed)
[facility] History and Physical   Primary Care Physician:  Chesley Noon, MD   Reason for Procedure:   screening  Plan:    colonoscopy     HPI: Lorraine Kemp is a 71 y.o. female here for screening colonoscopy. Eliquisfor Afib held  Past Medical History:  Diagnosis Date   Atrial fibrillation ([place])    Chronic diastolic congestive heart failure (HCC)    Complication of anesthesia    Depression    Hematuria    workup by Dr. Diona Fanti   Longstanding persistent atrial fibrillation ([place])    MITRAL REGURGITATION    Mitral stenosis    PONV (postoperative nausea and vomiting)    RHEUMATIC HEART DISEASE    S/P Minimally invasive maze operation for atrial fibrillation 03/28/2018   Complete bilateral atrial lesion set using crythermy and bipolar radiofrequency ablation with clipping of LA appendage via right mini thoracotomy   S/P minimally invasive mitral valve replacement with bioprosthetic valve 03/28/2018   31 mm [facility] Mitral stented bovine pericardial tissue valve    Thyroid nodule    Wrist fracture, left 2019   Dr. Sharol Given    Past Surgical History:  Procedure Laterality Date   ABDOMINAL AORTOGRAM N/A 03/04/2018   Procedure: ABDOMINAL AORTOGRAM;  Surgeon: Jettie Booze, MD;  Location: [facility];  Service: Cardiovascular;  Laterality: N/A;   CARDIOVERSION     2000   CESAREAN SECTION     COLONOSCOPY     MINIMALLY INVASIVE MAZE PROCEDURE N/A 03/28/2018   Procedure: MINIMALLY INVASIVE MAZE PROCEDURE;  Surgeon: Rexene Alberts, MD;  Location: [facility];  Service: Open Heart Surgery;  Laterality: N/A;   MITRAL VALVE REPLACEMENT Right 03/28/2018   Procedure: MINIMALLY INVASIVE MITRAL VALVE (MV) REPLACEMENT. Magna Mitral Ease 1m.;  Surgeon: ORexene Alberts MD;  [place]  Service: Open Heart Surgery;  Laterality: Right;  glutaraldehyde   PERCUTANEOUS BALLOON VALVULOPLASTY  10/1998   [facility]   RIGHT/LEFT HEART CATH AND CORONARY  ANGIOGRAPHY N/A 03/04/2018   Procedure: RIGHT/LEFT HEART CATH AND CORONARY ANGIOGRAPHY;  Surgeon: VJettie Booze MD;  [facility];  Service: Cardiovascular;  Laterality: N/A;   screw in ankles  1989   right   TEE WITHOUT CARDIOVERSION N/A 12/31/2017   Procedure: TRANSESOPHAGEAL ECHOCARDIOGRAM (TEE);  Surgeon: CLelon Perla MD;  [facility];  Service: Cardiovascular;  Laterality: N/A;   TEE WITHOUT CARDIOVERSION N/A 03/28/2018   Procedure: TRANSESOPHAGEAL ECHOCARDIOGRAM (TEE);  Surgeon: ORexene Alberts MD;  [place]  Service: Open Heart Surgery;  Laterality: N/A;   THYROIDECTOMY  05/30/2012   Procedure: THYROIDECTOMY;  Surgeon: TEarnstine Regal MD;  [facility];  Service: General;  Laterality: N/A;  [place]   [place]     Prior to Admission medications   Medication Sig Start Date End Date Taking? Authorizing Provider  DULoxetine (CYMBALTA) 30 MG capsule Take 1 capsule by mouth daily. 05/10/21  Yes [provider]  levothyroxine (SYNTHROID) 112 MCG tablet Take 112 mcg by mouth daily. 12/07/18  Yes [provider]  metoprolol succinate (TOPROL XL) 25 MG 24 hr tablet Take 1 tablet (25 mg total) by mouth daily. 08/01/21  Yes WLoel Dubonnet NP  Multiple Minerals-Vitamins (CALCIUM-MAGNESIUM-ZINC-D3 PO) Take 1 tablet by mouth daily.   Yes [provider]  Multiple Vitamins-Minerals (MULTIVITAMIN WITH MINERALS) tablet Take 1 tablet by mouth daily.    Yes [provider]  amoxicillin (AMOXIL) 500 MG capsule Take 2,000 mg by mouth See admin instructions. Take 2000 mg by mouth 1 hour prior to dental appointment 07/31/17   [provider]  apixaban (ELIQUIS) 5 MG TABS tablet Take 1 tablet (5 mg total) by mouth 2 (two) times daily. Patient not taking: Reported on 09/26/2021 08/21/21   Lelon Perla, MD    Current Outpatient Medications  Medication Sig Dispense Refill    DULoxetine (CYMBALTA) 30 MG capsule Take 1 capsule by mouth daily.     levothyroxine (SYNTHROID) 112 MCG tablet Take 112 mcg by mouth daily.     metoprolol succinate (TOPROL XL) 25 MG 24 hr tablet Take 1 tablet (25 mg total) by mouth daily. 30 tablet 5   Multiple Minerals-Vitamins (CALCIUM-MAGNESIUM-ZINC-D3 PO) Take 1 tablet by mouth daily.     Multiple Vitamins-Minerals (MULTIVITAMIN WITH MINERALS) tablet Take 1 tablet by mouth daily.      amoxicillin (AMOXIL) 500 MG capsule Take 2,000 mg by mouth See admin instructions. Take 2000 mg by mouth 1 hour prior to dental appointment  2   apixaban (ELIQUIS) 5 MG TABS tablet Take 1 tablet (5 mg total) by mouth 2 (two) times daily. (Patient not taking: Reported on 09/26/2021) 60 tablet 6   Current Facility-Administered Medications  Medication Dose Route Frequency Provider Last Rate Last Admin   0.9 %  sodium chloride infusion  500 mL Intravenous Once Gatha Mayer, MD        Allergies as of 09/26/2021   (No Known Allergies)    Family History  Problem Relation Age of Onset   Lung cancer Father    Cancer Father        lung   Heart disease Mother        S/P CABG   Cancer Brother        brain   Hypertension Sister    Heart disease Maternal Grandmother     Social History   Socioeconomic History   Marital status: Married    Spouse name: Not on file   Number of children: 2   Years of education: Not on file   Highest education level: Not on file  Occupational History   Occupation: branch office administrator    Employer: [employer]  Tobacco Use   Smoking status: Never   Smokeless tobacco: Never  Vaping Use   Vaping Use: Never used  Substance and Sexual Activity   Alcohol use: Yes    Alcohol/week: 1.0 standard drink    Types: 1 Glasses of wine per week    Comment: occas glass of wine weekly   Drug use: No   Sexual activity: Yes    Partners: Male    Birth control/protection: Surgical, Post-menopausal    Comment: BTL  Other  Topics Concern   Not on file  Social History Narrative   Not on file   Social Determinants of Health   Financial Resource Strain: Not on file  Food Insecurity: Not on file  Transportation Needs: Not on file  Physical Activity: Not on file  Stress: Not on file  Social Connections: Not on file  Intimate Partner Violence: Not on file    Review of Systems:  All other review of systems negative except as mentioned in the HPI.  Physical Exam: Vital signs BP 126/65   Pulse 74   Temp (!) 97.5 F (36.4 C)   Resp 13   Ht '5\' 5"'$  (1.651 m)   Wt 174 lb (  78.9 kg)   LMP 05/07/2002 (Approximate)   SpO2 98%   BMI 28.96 kg/m   General:   Alert,  Well-developed, well-nourished, pleasant and cooperative in NAD Lungs:  Clear throughout to auscultation.   Heart:  no murmurs, clicks, rubs,  or gallops. Abdomen:  Soft, nontender and nondistended. Normal bowel sounds.   Neuro/Psych:  Alert and cooperative. Normal mood and affect. A and O x 3   '@Kenzli Barritt'$  Simonne Maffucci, MD, [facility] [phone] (pager) 09/26/2021 8:45 AM@

## 2021-09-26 NOTE — Progress Notes (Signed)
VS-CW  Pt's states no medical or surgical changes since previsit or office visit.  

## 2021-09-26 NOTE — Op Note (Signed)
[facility] Patient Name: Lorraine Kemp Procedure Date: 09/26/2021 8:39 AM MRN: [id number] Endoscopist: Gatha Mayer , MD Age: 71 Referring MD:  Date of Birth: Aug 05, 1950 Gender: Female Account #: [id number] Procedure:                Colonoscopy Indications:              Screening for colorectal malignant neoplasm, Last                            colonoscopy: 2012 Medicines:                Monitored Anesthesia Care Procedure:                Pre-Anesthesia Assessment:                           - Prior to the procedure, a History and Physical                            was performed, and patient medications and                            allergies were reviewed. The patient's tolerance of                            previous anesthesia was also reviewed. The risks                            and benefits of the procedure and the sedation                            options and risks were discussed with the patient.                            All questions were answered, and informed consent                            was obtained. Prior Anticoagulants: The patient                            last took Coumadin (warfarin) 5 days prior to the                            procedure. ASA Grade Assessment: III - A patient                            with severe systemic disease. After reviewing the                            risks and benefits, the patient was deemed in                            satisfactory condition to undergo the procedure.  After obtaining informed consent, the colonoscope                            was passed under direct vision. Throughout the                            procedure, the patient's blood pressure, pulse, and                            oxygen saturations were monitored continuously. The                            Olympus PCF-H190DL (#[id number]) Colonoscope was                            introduced through the anus and advanced to  the the                            cecum, identified by appendiceal orifice and                            ileocecal valve. The colonoscopy was performed                            without difficulty. The patient tolerated the                            procedure well. The quality of the bowel                            preparation was good. The ileocecal valve,                            appendiceal orifice, and rectum were photographed. Scope In: 8:53:24 AM Scope Out: 9:08:14 AM Scope Withdrawal Time: 0 hours 11 minutes 32 seconds  Total Procedure Duration: 0 hours 14 minutes 50 seconds  Findings:                 The perianal and digital rectal examinations were                            normal.                           Two sessile polyps were found in the distal sigmoid                            colon. The polyps were diminutive in size. These                            polyps were removed with a cold snare. Resection                            and retrieval were complete. Verification of  patient identification for the specimen was done.                            Estimated blood loss was minimal.                           The exam was otherwise without abnormality on                            direct and retroflexion views. Complications:            No immediate complications. Estimated Blood Loss:     Estimated blood loss was minimal. Impression:               - Two diminutive polyps in the distal sigmoid                            colon, removed with a cold snare. Resected and                            retrieved.                           - The examination was otherwise normal on direct                            and retroflexion views. Recommendation:           - Patient has a contact number available for                            emergencies. The signs and symptoms of potential                            delayed complications were discussed with  the                            patient. Return to normal activities tomorrow.                            Written discharge instructions were provided to the                            patient.                           - Resume previous diet.                           - Continue present medications.                           - Await pathology results.                           - No recommendation at this time regarding repeat  colonoscopy due to age.                           - Resume Eliquis (apixaban) at prior dose tomorrow.                            She is transitioning from warfarin per cardiology Gatha Mayer, MD 09/26/2021 9:15:54 AM This report has been signed electronically.

## 2021-09-26 NOTE — Progress Notes (Signed)
Called to room to assist during endoscopic procedure.  Patient ID and intended procedure confirmed with present staff. Received instructions for my participation in the procedure from the performing physician.  

## 2021-09-26 NOTE — Progress Notes (Signed)
PT taken to PACU. Monitors in place. VSS. Report given to RN. 

## 2021-09-26 NOTE — Patient Instructions (Addendum)
I found and removed 2 tiny polyps that look benign.  I will have them analyzed and let you know recommendations - doubt you should need a routine repeat colonoscopy.  I appreciate the opportunity to care for you. Gatha Mayer, MD, [facility]   Handout on polyps given.  RESUME ELIQUIS (APIXABAN) AT PRIOR DOSE TOMORROW!  YOU HAD AN ENDOSCOPIC PROCEDURE TODAY AT [facility]:   Refer to the procedure report that was given to you for any specific questions about what was found during the examination.  If the procedure report does not answer your questions, please call your gastroenterologist to clarify.  If you requested that your care partner not be given the details of your procedure findings, then the procedure report has been included in a sealed envelope for you to review at your convenience later.  YOU SHOULD EXPECT: Some feelings of bloating in the abdomen. Passage of more gas than usual.  Walking can help get rid of the air that was put into your GI tract during the procedure and reduce the bloating. If you had a lower endoscopy (such as a colonoscopy or flexible sigmoidoscopy) you may notice spotting of blood in your stool or on the toilet paper. If you underwent a bowel prep for your procedure, you may not have a normal bowel movement for a few days.  Please Note:  You might notice some irritation and congestion in your nose or some drainage.  This is from the oxygen used during your procedure.  There is no need for concern and it should clear up in a day or so.  SYMPTOMS TO REPORT IMMEDIATELY:  Following lower endoscopy (colonoscopy or flexible sigmoidoscopy):  Excessive amounts of blood in the stool  Significant tenderness or worsening of abdominal pains  Swelling of the abdomen that is new, acute  Fever of 100F or higher   For urgent or emergent issues, a gastroenterologist can be reached at any hour by calling [phone]. Do not use MyChart messaging for urgent  concerns.    DIET:  We do recommend a small meal at first, but then you may proceed to your regular diet.  Drink plenty of fluids but you should avoid alcoholic beverages for 24 hours.  ACTIVITY:  You should plan to take it easy for the rest of today and you should NOT DRIVE or use heavy machinery until tomorrow (because of the sedation medicines used during the test).    FOLLOW UP: Our staff will call the number listed on your records 48-72 hours following your procedure to check on you and address any questions or concerns that you may have regarding the information given to you following your procedure. If we do not reach you, we will leave a message.  We will attempt to reach you two times.  During this call, we will ask if you have developed any symptoms of COVID 19. If you develop any symptoms (ie: fever, flu-like symptoms, shortness of breath, cough etc.) before then, please call [phone].  If you test positive for Covid 19 in the 2 weeks post procedure, please call and report this information to [place].    If any biopsies were taken you will be contacted by phone or by letter within the next 1-3 weeks.  Please call us at [phone] if you have not heard about the biopsies in 3 weeks.    SIGNATURES/CONFIDENTIALITY: You and/or your care partner have signed paperwork which will be entered into your electronic medical  record.  These signatures attest to the fact that that the information above on your After Visit Summary has been reviewed and is understood.  Full responsibility of the confidentiality of this discharge information lies with you and/or your care-partner.

## 2021-09-27 ENCOUNTER — Telehealth: Payer: Self-pay | Admitting: *Deleted

## 2021-09-27 NOTE — Telephone Encounter (Signed)
  Follow up Call-     09/26/2021    7:37 AM  Call back number  Post procedure Call Back phone  # [phone]  Permission to leave phone message Yes     Patient questions:  Do you have a fever, pain , or abdominal swelling? No. Pain Score  0 *  Have you tolerated food without any problems? Yes.    Have you been able to return to your normal activities? Yes.    Do you have any questions about your discharge instructions: Diet   No. Medications  No. Follow up visit  No.  Do you have questions or concerns about your Care? No.  Actions: * If pain score is 4 or above: No action needed, pain <4.

## 2021-10-05 ENCOUNTER — Encounter: Payer: Self-pay | Admitting: Internal Medicine

## 2021-12-21 ENCOUNTER — Encounter: Payer: Self-pay | Admitting: Cardiology

## 2021-12-21 MED ORDER — DILTIAZEM HCL ER COATED BEADS 120 MG PO CP24: 120.0000 mg | ORAL_CAPSULE | Freq: Every day | ORAL | 6 refills | Status: DC

## 2021-12-21 NOTE — Telephone Encounter (Signed)
Routed to Dr Stanford Breed for review

## 2021-12-21 NOTE — Telephone Encounter (Signed)
Sent message by Deloris Ping  to patient  of the medication changes    Updated medication list   E-sent new prescription to pharmacy

## 2022-01-17 ENCOUNTER — Other Ambulatory Visit (HOSPITAL_BASED_OUTPATIENT_CLINIC_OR_DEPARTMENT_OTHER): Payer: Self-pay | Admitting: Family

## 2022-01-17 DIAGNOSIS — I4892 Unspecified atrial flutter: Secondary | ICD-10-CM

## 2022-01-17 NOTE — Telephone Encounter (Signed)
Pt of Dr. Stanford Breed. Please review for refill. Thank you!

## 2022-04-11 ENCOUNTER — Other Ambulatory Visit: Payer: Self-pay | Admitting: Cardiology

## 2022-04-11 DIAGNOSIS — I4892 Unspecified atrial flutter: Secondary | ICD-10-CM

## 2022-04-11 NOTE — Telephone Encounter (Signed)
Prescription refill request for Eliquis received. Indication: AF Last office visit: 08/21/21  Lorraine Ross MD Scr: 0.91 on 07/31/21 Age: 71 Weight: 75.8kg  Based on above findings Eliquis '5mg'$  twice daily is the appropriate dose.  Refill approved.

## 2022-05-02 ENCOUNTER — Other Ambulatory Visit: Payer: Self-pay | Admitting: Family Medicine

## 2022-05-02 DIAGNOSIS — Z1231 Encounter for screening mammogram for malignant neoplasm of breast: Secondary | ICD-10-CM

## 2022-05-07 DIAGNOSIS — C801 Malignant (primary) neoplasm, unspecified: Secondary | ICD-10-CM

## 2022-05-07 HISTORY — DX: Malignant (primary) neoplasm, unspecified: C80.1

## 2022-05-25 NOTE — Progress Notes (Signed)
HPI: FU atrial fibrillation, MS and rheumatic heart disease s/p MVR. She has had prior balloon valvuloplasty at [facility] in June 2000. TEE August 2019 showed normal LV function, biatrial enlargement, mild right ventricular enlargement, severe mitral stenosis with mitral valve area of 1 cm, mild to moderate mitral regurgitation and mild tricuspid regurgitation. Cardiac catheterization October 2019 showed nonobstructive coronary disease. Carotid Dopplers November 2019 showed 1 to 39% bilateral stenosis.  Patient had minimally invasive mitral valve replacement with an Edwards magna mitral stented bovine pericardial tissue valve and minimally invasive maze procedure 11/19. Echocardiogram April 2023 showed normal LV function, moderate right ventricular enlargement, severe biatrial enlargement, previous mitral valve replacement with mean gradient 3 mmHg and no significant mitral regurgitation.  Since I last saw her, the patient has dyspnea with more extreme activities but not with routine activities. It is relieved with rest. It is not associated with chest pain. There is no orthopnea, PND or pedal edema. There is no syncope or palpitations. There is no exertional chest pain.   Current Outpatient Medications  Medication Sig Dispense Refill   amoxicillin (AMOXIL) 500 MG capsule Take 2,000 mg by mouth See admin instructions. Take 2000 mg by mouth 1 hour prior to dental appointment  2   apixaban (ELIQUIS) 5 MG TABS tablet TAKE 1 TABLET BY MOUTH TWICE A DAY 60 tablet 5   diltiazem (CARDIZEM CD) 120 MG 24 hr capsule Take 1 capsule (120 mg total) by mouth daily. 30 capsule 6   DULoxetine (CYMBALTA) 30 MG capsule Take 1 capsule by mouth daily.     levothyroxine (SYNTHROID) 112 MCG tablet Take 112 mcg by mouth daily.     Multiple Minerals-Vitamins (CALCIUM-MAGNESIUM-ZINC-D3 PO) Take 1 tablet by mouth daily.     No current facility-administered medications for this visit.     Past Medical History:   Diagnosis Date   Atrial fibrillation ([place])    Chronic diastolic congestive heart failure (HCC)    Complication of anesthesia    Depression    Hematuria    workup by Dr. Diona Fanti   Longstanding persistent atrial fibrillation ([place])    MITRAL REGURGITATION    Mitral stenosis    PONV (postoperative nausea and vomiting)    RHEUMATIC HEART DISEASE    S/P Minimally invasive maze operation for atrial fibrillation 03/28/2018   Complete bilateral atrial lesion set using crythermy and bipolar radiofrequency ablation with clipping of LA appendage via right mini thoracotomy   S/P minimally invasive mitral valve replacement with bioprosthetic valve 03/28/2018   31 mm [facility] Mitral stented bovine pericardial tissue valve    Thyroid nodule    Wrist fracture, left 2019   Dr. Sharol Given    Past Surgical History:  Procedure Laterality Date   ABDOMINAL AORTOGRAM N/A 03/04/2018   Procedure: ABDOMINAL AORTOGRAM;  Surgeon: Jettie Booze, MD;  Location: [facility];  Service: Cardiovascular;  Laterality: N/A;   CARDIOVERSION     2000   CESAREAN SECTION     COLONOSCOPY     MINIMALLY INVASIVE MAZE PROCEDURE N/A 03/28/2018   Procedure: MINIMALLY INVASIVE MAZE PROCEDURE;  Surgeon: Rexene Alberts, MD;  Location: [facility];  Service: Open Heart Surgery;  Laterality: N/A;   MITRAL VALVE REPLACEMENT Right 03/28/2018   Procedure: MINIMALLY INVASIVE MITRAL VALVE (MV) REPLACEMENT. Magna Mitral Ease 28m.;  Surgeon: ORexene Alberts MD;  [place]  Service: Open Heart Surgery;  Laterality: Right;  glutaraldehyde   PERCUTANEOUS BALLOON VALVULOPLASTY  10/1998  [facility]   RIGHT/LEFT HEART CATH AND CORONARY ANGIOGRAPHY N/A 03/04/2018   Procedure: RIGHT/LEFT HEART CATH AND CORONARY ANGIOGRAPHY;  Surgeon: Jettie Booze, MD;  Location: [facility];  Service: Cardiovascular;  Laterality: N/A;   screw in ankles  1989   right   TEE WITHOUT CARDIOVERSION N/A 12/31/2017    Procedure: TRANSESOPHAGEAL ECHOCARDIOGRAM (TEE);  Surgeon: Lelon Perla, MD;  Location: [facility];  Service: Cardiovascular;  Laterality: N/A;   TEE WITHOUT CARDIOVERSION N/A 03/28/2018   Procedure: TRANSESOPHAGEAL ECHOCARDIOGRAM (TEE);  Surgeon: Rexene Alberts, MD;  Location: [place];  Service: Open Heart Surgery;  Laterality: N/A;   THYROIDECTOMY  05/30/2012   Procedure: THYROIDECTOMY;  Surgeon: Earnstine Regal, MD;  [facility];  Service: General;  Laterality: N/A;  total thyroidectomy   TONSILLECTOMY  1956   TUBAL LIGATION      Social History   Socioeconomic History   Marital status: Married    Spouse name: Not on file   Number of children: 2   Years of education: Not on file   Highest education level: Not on file  Occupational History   Occupation: branch office administrator    Employer: [employer]  Tobacco Use   Smoking status: Never   Smokeless tobacco: Never  Vaping Use   Vaping Use: Never used  Substance and Sexual Activity   Alcohol use: Yes    Alcohol/week: 1.0 standard drink of alcohol    Types: 1 Glasses of wine per week    Comment: occas glass of wine weekly   Drug use: No   Sexual activity: Yes    Partners: Male    Birth control/protection: Surgical, Post-menopausal    Comment: BTL  Other Topics Concern   Not on file  Social History Narrative   Not on file   Social Determinants of Health   Financial Resource Strain: Not on file  Food Insecurity: Not on file  Transportation Needs: Not on file  Physical Activity: Not on file  Stress: Not on file  Social Connections: Not on file  Intimate Partner Violence: Not on file    Family History  Problem Relation Age of Onset   Lung cancer Father    Cancer Father        lung   Heart disease Mother        S/P CABG   Cancer Brother        brain   Hypertension Sister    Heart disease Maternal Grandmother     ROS: no fevers or chills, productive cough, hemoptysis, dysphasia, odynophagia,  melena, hematochezia, dysuria, hematuria, rash, seizure activity, orthopnea, PND, pedal edema, claudication. Remaining systems are negative.  Physical Exam: Well-developed well-nourished in no acute distress.  Skin is warm and dry.  HEENT is normal.  Neck is supple.  Chest is clear to auscultation with normal expansion.  Cardiovascular exam is regular rate and rhythm.  Abdominal exam nontender or distended. No masses palpated. Extremities show no edema. neuro grossly intact  ECG-normal sinus rhythm at a rate of 85, first-degree AV block, cannot rule out septal infarct.  Personally reviewed  A/P  1 status post mitral valve replacement-most recent echocardiogram showed normally functioning valve.  Continue SBE prophylaxis.    2 paroxysmal atrial fibrillation-patient remains asymptomatic.  She is back in sinus rhythm today.  Continue Cardizem and apixaban.  3 hyperlipidemia-managed by primary care.  4 history of palpitations-continue beta-blocker.  Kirk Ruths, MD

## 2022-06-08 ENCOUNTER — Encounter: Payer: Self-pay | Admitting: Cardiology

## 2022-06-08 ENCOUNTER — Ambulatory Visit: Payer: Medicare HMO | Attending: Cardiology | Admitting: Cardiology

## 2022-06-08 VITALS — BP 112/70 | HR 85 | Ht 65.0 in | Wt 164.6 lb

## 2022-06-08 DIAGNOSIS — E785 Hyperlipidemia, unspecified: Secondary | ICD-10-CM

## 2022-06-08 DIAGNOSIS — I25118 Atherosclerotic heart disease of native coronary artery with other forms of angina pectoris: Secondary | ICD-10-CM | POA: Diagnosis not present

## 2022-06-08 DIAGNOSIS — Z9889 Other specified postprocedural states: Secondary | ICD-10-CM | POA: Diagnosis not present

## 2022-06-08 NOTE — Patient Instructions (Signed)
    Follow-Up: At [facility], you and your health needs are our priority.  As part of our continuing mission to provide you with exceptional heart care, we have created designated Provider Care Teams.  These Care Teams include your primary Cardiologist (physician) and Advanced Practice Providers (APPs -  Physician Assistants and Nurse Practitioners) who all work together to provide you with the care you need, when you need it.  We recommend signing up for the patient portal called "MyChart".  Sign up information is provided on this After Visit Summary.  MyChart is used to connect with patients for Virtual Visits (Telemedicine).  Patients are able to view lab/test results, encounter notes, upcoming appointments, etc.  Non-urgent messages can be sent to your provider as well.   To learn more about what you can do with MyChart, go to [email or web address].    Your next appointment:   12 month(s)  Provider:   Brian Crenshaw, MD     

## 2022-06-21 ENCOUNTER — Ambulatory Visit: Payer: Medicare HMO

## 2022-07-03 ENCOUNTER — Ambulatory Visit
Admission: RE | Admit: 2022-07-03 | Discharge: 2022-07-03 | Disposition: A | Discharge status: Home / Self Care | Payer: Medicare HMO | Source: Ambulatory Visit | Attending: Family Medicine | Admitting: Family Medicine

## 2022-07-03 DIAGNOSIS — Z1231 Encounter for screening mammogram for malignant neoplasm of breast: Secondary | ICD-10-CM

## 2022-07-06 ENCOUNTER — Other Ambulatory Visit: Payer: Self-pay | Admitting: Family Medicine

## 2022-07-06 DIAGNOSIS — R928 Other abnormal and inconclusive findings on diagnostic imaging of breast: Secondary | ICD-10-CM

## 2022-07-11 ENCOUNTER — Other Ambulatory Visit: Payer: Self-pay | Admitting: Cardiology

## 2022-07-13 ENCOUNTER — Ambulatory Visit
Admission: RE | Admit: 2022-07-13 | Discharge: 2022-07-13 | Disposition: A | Discharge status: Home / Self Care | Payer: Medicare HMO | Source: Ambulatory Visit | Attending: Family Medicine | Admitting: Family Medicine

## 2022-07-13 DIAGNOSIS — R928 Other abnormal and inconclusive findings on diagnostic imaging of breast: Secondary | ICD-10-CM

## 2022-07-16 ENCOUNTER — Other Ambulatory Visit: Payer: Self-pay | Admitting: Family Medicine

## 2022-07-16 DIAGNOSIS — R928 Other abnormal and inconclusive findings on diagnostic imaging of breast: Secondary | ICD-10-CM

## 2022-07-18 ENCOUNTER — Ambulatory Visit
Admission: RE | Admit: 2022-07-18 | Discharge: 2022-07-18 | Disposition: A | Discharge status: Home / Self Care | Payer: Medicare HMO | Source: Ambulatory Visit | Attending: Family Medicine | Admitting: Family Medicine

## 2022-07-18 DIAGNOSIS — R928 Other abnormal and inconclusive findings on diagnostic imaging of breast: Secondary | ICD-10-CM

## 2022-07-18 HISTORY — PX: BREAST BIOPSY: SHX20

## 2022-07-20 ENCOUNTER — Telehealth: Payer: Self-pay | Admitting: Hematology and Oncology

## 2022-07-20 NOTE — Telephone Encounter (Signed)
Spoke to patient to confirm upcoming morning [facility] clinic appointment on 3/27, paperwork will be sent via e-mail.   Gave location and time, also informed patient that the surgeon's office would be calling as well to get information from them similar to the packet that they will be receiving so make sure to do both.  Reminded patient that all providers will be coming to the clinic to see them HERE and if they had any questions to not hesitate to reach back out to myself or their navigators. 

## 2022-07-30 ENCOUNTER — Other Ambulatory Visit: Payer: Self-pay | Admitting: *Deleted

## 2022-07-30 NOTE — Progress Notes (Incomplete)
Radiation Oncology         [phone]) [phone] ________________________________  Name: Lorraine Kemp        MRN: [id number]  Date of Service: 08/01/2022 DOB: May 08, 1950  CC:Chesley Noon, MD  Jovita Kussmaul, MD     REFERRING PHYSICIAN: Autumn Messing III, MD   DIAGNOSIS: There were no encounter diagnoses.   HISTORY OF PRESENT ILLNESS: Lorraine Kemp is a 72 y.o. female seen in the multidisciplinary breast clinic for a new diagnosis of right breast cancer. The patient was noted to have calcifications in her right breast on screening mammogram. Further evaluation was recommended. Diagnostic mammogram on 07/13/22 showed calcifications in the UOQ spanning 2.0 cm in legnth. Accordingly patient underwent core needle biopsy on 07/18/22. 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 Prognostic indicators showed both ER/PR 95% positive with strong staining intensity.   She is seen today to discuss treatment recommendations of her cancer.      PREVIOUS RADIATION THERAPY: {EXAM; YES/NO:19492::"No"}   PAST MEDICAL HISTORY:  Past Medical History:  Diagnosis Date   Atrial fibrillation (HCC)    Chronic diastolic congestive heart failure (HCC)    Complication of anesthesia    Depression    Hematuria    workup by Dr. Diona Fanti   Longstanding persistent atrial fibrillation ([place])    MITRAL REGURGITATION    Mitral stenosis    PONV (postoperative nausea and vomiting)    RHEUMATIC HEART DISEASE    S/P Minimally invasive maze operation for atrial fibrillation 03/28/2018   Complete bilateral atrial lesion set using crythermy and bipolar radiofrequency ablation with clipping of LA appendage via right mini thoracotomy   S/P minimally invasive mitral valve replacement with bioprosthetic valve 03/28/2018   31 mm [facility] Mitral stented bovine pericardial tissue valve    Thyroid nodule    Wrist fracture, left 2019   Dr. Sharol Given       PAST SURGICAL  HISTORY: Past Surgical History:  Procedure Laterality Date   ABDOMINAL AORTOGRAM N/A 03/04/2018   Procedure: ABDOMINAL AORTOGRAM;  Surgeon: Jettie Booze, MD;  Location: [facility];  Service: Cardiovascular;  Laterality: N/A;   BREAST BIOPSY Right 07/18/2022   MM RT BREAST BX W LOC DEV 1ST LESION IMAGE BX SPEC STEREO GUIDE 07/18/2022 [facility] MAMMO[facility]APHY   CARDIOVERSION     2000   CESAREAN SECTION     COLONOSCOPY     MINIMALLY INVASIVE MAZE PROCEDURE N/A 03/28/2018   Procedure: MINIMALLY INVASIVE MAZE PROCEDURE;  Surgeon: Rexene Alberts, MD;  Location: [place];  Service: Open Heart Surgery;  Laterality: N/A;   MITRAL VALVE REPLACEMENT Right 03/28/2018   Procedure: MINIMALLY INVASIVE MITRAL VALVE (MV) REPLACEMENT. Magna Mitral Ease 4mm.;  Surgeon: Rexene Alberts, MD;  Location: [place];  Service: Open Heart Surgery;  Laterality: Right;  glutaraldehyde   PERCUTANEOUS BALLOON VALVULOPLASTY  10/1998   [employer]   RIGHT/LEFT HEART CATH AND CORONARY ANGIOGRAPHY N/A 03/04/2018   Procedure: RIGHT/LEFT HEART CATH AND CORONARY ANGIOGRAPHY;  Surgeon: Jettie Booze, MD;  Location: [place] [facility];  Service: Cardiovascular;  Laterality: N/A;   screw in ankles  1989   right   TEE WITHOUT CARDIOVERSION N/A 12/31/2017   Procedure: TRANSESOPHAGEAL ECHOCARDIOGRAM (TEE);  Surgeon: Lelon Perla, MD;  Location: [facility];  Service: Cardiovascular;  Laterality: N/A;   TEE WITHOUT CARDIOVERSION N/A 03/28/2018   Procedure: TRANSESOPHAGEAL ECHOCARDIOGRAM (TEE);  Surgeon: Rexene Alberts, MD;  Location: [facility];  Service: Open Heart Surgery;  Laterality: N/A;   THYROIDECTOMY  05/30/2012   Procedure: THYROIDECTOMY;  Surgeon: Earnstine Regal, MD;  Location: [facility];  Service: General;  Laterality: N/A;  total thyroidectomy   TONSILLECTOMY  1956   TUBAL LIGATION       FAMILY HISTORY:  Family History  Problem Relation Age of Onset   Lung cancer Father    Cancer Father        lung    Heart disease Mother        S/P CABG   Cancer Brother        brain   Hypertension Sister    Heart disease Maternal Grandmother      SOCIAL HISTORY:  reports that she has never smoked. She has never used smokeless tobacco. She reports current alcohol use of about 1.0 standard drink of alcohol per week. She reports that she does not use drugs.   ALLERGIES: Metoprolol succinate [metoprolol]   MEDICATIONS:  Current Outpatient Medications  Medication Sig Dispense Refill   amoxicillin (AMOXIL) 500 MG capsule Take 2,000 mg by mouth See admin instructions. Take 2000 mg by mouth 1 hour prior to dental appointment  2   apixaban (ELIQUIS) 5 MG TABS tablet TAKE 1 TABLET BY MOUTH TWICE A DAY 60 tablet 5   diltiazem (CARDIZEM CD) 120 MG 24 hr capsule TAKE 1 CAPSULE BY MOUTH EVERY DAY 90 capsule 2   DULoxetine (CYMBALTA) 30 MG capsule Take 1 capsule by mouth daily.     levothyroxine (SYNTHROID) 112 MCG tablet Take 112 mcg by mouth daily.     Multiple Minerals-Vitamins (CALCIUM-MAGNESIUM-ZINC-D3 PO) Take 1 tablet by mouth daily.     No current facility-administered medications for this visit.     REVIEW OF SYSTEMS: On review of systems, the patient reports that she is doing well overall. No breast specific complaints are verbalized.  ***      PHYSICAL EXAM:  Wt Readings from Last 3 Encounters:  06/08/22 164 lb 9.6 oz (74.7 kg)  09/26/21 174 lb (78.9 kg)  08/21/21 167 lb (75.8 kg)   Temp Readings from Last 3 Encounters:  09/26/21 (!) 97.5 F (36.4 C)  03/23/19 97.7 F (36.5 C) (Skin)  04/02/18 98.5 F (36.9 C) (Oral)   BP Readings from Last 3 Encounters:  06/08/22 112/70  09/26/21 (!) 131/59  08/21/21 134/80   Pulse Readings from Last 3 Encounters:  06/08/22 85  09/26/21 86  08/21/21 69    In general this is a well appearing *** female in no acute distress. She's alert and oriented x4 and appropriate throughout the examination. Cardiopulmonary assessment is negative for  acute distress and she exhibits normal effort. Bilateral breast exam is deferred.    ECOG = ***  0 - Asymptomatic (Fully active, able to carry on all predisease activities without restriction)  1 - Symptomatic but completely ambulatory (Restricted in physically strenuous activity but ambulatory and able to carry out work of a light or sedentary nature. For example, light housework, office work)  2 - Symptomatic, <50% in bed during the day (Ambulatory and capable of all self care but unable to carry out any work activities. Up and about more than 50% of waking hours)  3 - Symptomatic, >50% in bed, but not bedbound (Capable of only limited self-care, confined to bed or chair 50% or more of waking hours)  4 - Bedbound (Completely disabled. Cannot carry on any self-care. Totally confined to bed or  chair)  5 - Death   Eustace Pen MM, Creech RH, Tormey DC, et al. [phone]). "Toxicity and response criteria of the [facility]". [place] Oncol. 5 (6): 649-55    LABORATORY DATA:  Lab Results  Component Value Date   WBC 6.9 07/31/2021   HGB 14.6 07/31/2021   HCT 43.0 07/31/2021   MCV 87 07/31/2021   PLT 210 07/31/2021   Lab Results  Component Value Date   NA 140 07/31/2021   K 4.7 07/31/2021   CL 103 07/31/2021   CO2 23 07/31/2021   Lab Results  Component Value Date   ALT 28 03/25/2018   AST 41 03/25/2018   ALKPHOS 60 03/25/2018   BILITOT 1.1 03/25/2018      RADIOGRAPHY: MM RT BREAST BX W LOC DEV 1ST LESION IMAGE BX SPEC STEREO GUIDE  Addendum Date: 07/30/2022   ADDENDUM REPORT: 07/30/2022 07:47 ADDENDUM: Pathology revealed DUCTAL CARCINOMA IN SITU, COMEDO, HIGH NUCLEAR GRADE, NECROSIS: PRESENT CALCIFICATIONS: PRESENT of the RIGHT breast calcifications, needle core biopsy, upper outer quadrant, (coil clip). This was found to be concordant by Dr. Lillia Mountain. Pathology results were discussed with the patient by telephone. The patient reported doing well after the  biopsy with tenderness at the site. Post biopsy instructions and care were reviewed and questions were answered. The patient was encouraged to call [facility] for any additional concerns. The patient was referred to [facility] on August 01, 2022, per patient request. Consider breast MRI given high grade histology. Pathology results reported by Stacie Acres RN on 07/19/2022. Electronically Signed   By: Lillia Mountain M.D.   On: 07/30/2022 07:47   Result Date: 07/30/2022 CLINICAL DATA:  Suspicious right breast calcifications. EXAM: RIGHT BREAST STEREOTACTIC CORE NEEDLE BIOPSY COMPARISON:  Previous exam(s). FINDINGS: The patient and I discussed the procedure of stereotactic-guided biopsy including benefits and alternatives. We discussed the high likelihood of a successful procedure. We discussed the risks of the procedure including infection, bleeding, tissue injury, clip migration, and inadequate sampling. Informed written consent was given. The usual time out protocol was performed immediately prior to the procedure. Using sterile technique and 1% lidocaine and 1% lidocaine with epinephrine as local anesthetic, under stereotactic guidance, a 9 gauge vacuum assisted device was used to perform core needle biopsy of calcifications in the upper-outer quadrant of the right breast using a lateral to medial approach. Specimen radiograph was performed showing calcifications are present in the tissue samples. Specimens with calcifications are identified for pathology. Lesion quadrant: Upper-outer quadrant At the conclusion of the procedure, coil shaped tissue marker clip was deployed into the biopsy cavity. Follow-up 2-view mammogram was performed and dictated separately. IMPRESSION: Stereotactic-guided biopsy of the right breast. No apparent complications. Electronically Signed: By: Lillia Mountain M.D. On: 07/18/2022  08:15  MM CLIP PLACEMENT RIGHT  Result Date: 07/18/2022 CLINICAL DATA:  Status post stereotactic biopsy of right breast calcifications. EXAM: 3D DIAGNOSTIC RIGHT MAMMOGRAM POST STEREOTACTIC BIOPSY COMPARISON:  Previous exam(s). FINDINGS: 3D Mammographic images were obtained following stereotactic guided biopsy of the right breast. The biopsy marking clip is in expected location in the upper-outer quadrant of the right breast. IMPRESSION: Appropriate positioning of the coil shaped biopsy marking clip at the site of biopsy in the upper-outer quadrant of the right breast. Final Assessment: Post Procedure Mammograms for Marker Placement Electronically Signed   By: Lillia Mountain M.D.   On: 07/18/2022  08:35  MM Digital Diagnostic Unilat R  Result Date: 07/13/2022 CLINICAL DATA:  Recall from screening mammography, calcifications involving the upper RIGHT breast at far posterior depth visible only on the MLO view. EXAM: DIGITAL DIAGNOSTIC UNILATERAL RIGHT MAMMOGRAM TECHNIQUE: Right digital diagnostic mammography was performed. COMPARISON:  Previous exam(s). ACR Breast Density Category c: The breasts are heterogeneously dense, which may obscure small masses. FINDINGS: Spot magnification MLO and mediolateral views and full field mediolateral and laterally exaggerated CC views were obtained. There is a group of fine pleomorphic calcifications in the upper breast at posterior depth spanning approximately 1.9 x 2.0 x 1.1 cm, some of which are linear in morphology. The calcifications are in the outer breast on the laterally exaggerated CC view, therefore in the [place]. IMPRESSION: Suspicious 2.0 cm group of calcifications in the UPPER OUTER QUADRANT of the RIGHT breast at far posterior depth. RECOMMENDATION: Stereotactic tomosynthesis core needle biopsy of the RIGHT breast calcifications. I have discussed the findings and recommendations with the patient. The stereotactic tomosynthesis core needle biopsy  procedure was discussed with the patient and her husband and their questions were answered. She wishes to proceed with the biopsy which will be scheduled by the Breast Imaging staff. BI-RADS CATEGORY  4: Suspicious. Electronically Signed   By: Evangeline Dakin M.D.   On: 07/13/2022 13:49  MM 3D SCREEN BREAST BILATERAL  Result Date: 07/05/2022 CLINICAL DATA:  Screening. EXAM: DIGITAL SCREENING BILATERAL MAMMOGRAM WITH TOMOSYNTHESIS AND CAD TECHNIQUE: Bilateral screening digital craniocaudal and mediolateral oblique mammograms were obtained. Bilateral screening digital breast tomosynthesis was performed. The images were evaluated with computer-aided detection. COMPARISON:  Previous exam(s). ACR Breast Density Category c: The breasts are heterogeneously dense, which may obscure small masses. FINDINGS: In the right breast, calcifications warrant further evaluation with magnified views. In the left breast, no findings suspicious for malignancy. IMPRESSION: Further evaluation is suggested for calcifications in the right breast. RECOMMENDATION: Diagnostic mammogram of the right breast. (Code:[id number]) The patient will be contacted regarding the findings, and additional imaging will be scheduled. BI-RADS CATEGORY  0: Incomplete: Need additional imaging evaluation. Electronically Signed   By: Abelardo Diesel M.D.   On: 07/05/2022 12:54       IMPRESSION/PLAN: 1. *** Dr. Lisbeth Renshaw discussed the pathology findings and reviewed the nature of ***. The consensus from the breast conference includes ***. Dr. Lisbeth Renshaw recommends external beam radiotherapy to the breast following her ***lumpectomy to reduce risks of local recurrence followed by antiestrogen therapy. We discussed the risks, benefits, short, and long term effects of radiotherapy, as well as the *** intent, and the patient is interested in proceeding. Dr. Lisbeth Renshaw discussed the delivery and logistics of radiotherapy and anticipates a course of *** weeks of radiotherapy to  the *** breast with *** deep inspiration breath hold technique. We will see her back a few weeks after surgery to discuss the simulation process and anticipate starting radiotherapy about 4-6 weeks after surgery.   2. Possible genetic predisposition to malignancy. The patient is a candidate for genetic testing given *** personal and family history. She will meet with our geneticist today in clinic.   In a visit lasting *** minutes, greater than 50% of the time was spent face to face reviewing her case, as well as in preparation of, discussing, and coordinating the patient's care.  The above documentation reflects my direct findings during this shared patient visit. Please see the separate note by Dr. Lisbeth Renshaw on this date for the remainder of the patient's plan  of care.    Leona Singleton, [place]    **Disclaimer: This note was dictated with voice recognition software. Similar sounding words can inadvertently be transcribed and this note may contain transcription errors which may not have been corrected upon publication of note.**

## 2022-07-31 ENCOUNTER — Encounter: Payer: Self-pay | Admitting: *Deleted

## 2022-07-31 DIAGNOSIS — D0511 Intraductal carcinoma in situ of right breast: Secondary | ICD-10-CM | POA: Insufficient documentation

## 2022-08-01 ENCOUNTER — Encounter: Payer: Self-pay | Admitting: *Deleted

## 2022-08-01 ENCOUNTER — Inpatient Hospital Stay: Payer: Medicare HMO | Admitting: Licensed Clinical Social Worker

## 2022-08-01 ENCOUNTER — Ambulatory Visit: Payer: Self-pay | Admitting: General Surgery

## 2022-08-01 ENCOUNTER — Telehealth: Payer: Self-pay | Admitting: *Deleted

## 2022-08-01 ENCOUNTER — Ambulatory Visit
Admission: RE | Admit: 2022-08-01 | Discharge: 2022-08-01 | Disposition: A | Discharge status: Home / Self Care | Payer: Medicare HMO | Source: Ambulatory Visit | Attending: Radiation Oncology | Admitting: Radiation Oncology

## 2022-08-01 ENCOUNTER — Ambulatory Visit: Payer: Medicare HMO | Admitting: Physical Therapy

## 2022-08-01 ENCOUNTER — Telehealth: Payer: Self-pay | Admitting: Genetic Counselor

## 2022-08-01 ENCOUNTER — Encounter: Payer: Self-pay | Admitting: Genetic Counselor

## 2022-08-01 ENCOUNTER — Inpatient Hospital Stay: Payer: Medicare HMO | Attending: Hematology and Oncology | Admitting: Hematology and Oncology

## 2022-08-01 ENCOUNTER — Inpatient Hospital Stay: Payer: Medicare HMO

## 2022-08-01 VITALS — BP 129/61 | HR 86 | Temp 97.9°F | Resp 16 | Ht 65.0 in | Wt 169.8 lb

## 2022-08-01 DIAGNOSIS — D0511 Intraductal carcinoma in situ of right breast: Secondary | ICD-10-CM | POA: Insufficient documentation

## 2022-08-01 DIAGNOSIS — Z952 Presence of prosthetic heart valve: Secondary | ICD-10-CM | POA: Diagnosis not present

## 2022-08-01 DIAGNOSIS — Z7901 Long term (current) use of anticoagulants: Secondary | ICD-10-CM | POA: Insufficient documentation

## 2022-08-01 DIAGNOSIS — Z170 Estrogen receptor positive status [ER+]: Secondary | ICD-10-CM | POA: Diagnosis not present

## 2022-08-01 LAB — CMP (CANCER CENTER ONLY)
ALT: 11 U/L (ref 0–44)
AST: 24 U/L (ref 15–41)
Albumin: 4.3 g/dL (ref 3.5–5.0)
Alkaline Phosphatase: 72 U/L (ref 38–126)
Anion gap: 6 (ref 5–15)
BUN: 16 mg/dL (ref 8–23)
CO2: 30 mmol/L (ref 22–32)
Calcium: 9.6 mg/dL (ref 8.9–10.3)
Chloride: 104 mmol/L (ref 98–111)
Creatinine: 0.97 mg/dL (ref 0.44–1.00)
GFR, Estimated: >60 mL/min (ref >60)
Glucose, Bld: 107 mg/dL — ABNORMAL HIGH (ref 70–99)
Potassium: 4.3 mmol/L (ref 3.5–5.1)
Sodium: 140 mmol/L (ref 135–145)
Total Bilirubin: 0.6 mg/dL (ref 0.3–1.2)
Total Protein: 7.5 g/dL (ref 6.5–8.1)

## 2022-08-01 LAB — CBC WITH DIFFERENTIAL (CANCER CENTER ONLY)
Abs Immature Granulocytes: 0.01 10*3/uL (ref 0.00–0.07)
Basophils Absolute: 0 10*3/uL (ref 0.0–0.1)
Basophils Relative: 1 %
Eosinophils Absolute: 0.2 10*3/uL (ref 0.0–0.5)
Eosinophils Relative: 3 %
HCT: 40 % (ref 36.0–46.0)
Hemoglobin: 13.3 g/dL (ref 12.0–15.0)
Immature Granulocytes: 0 %
Lymphocytes Relative: 22 %
Lymphs Abs: 1.5 10*3/uL (ref 0.7–4.0)
MCH: 30.2 pg (ref 26.0–34.0)
MCHC: 33.3 g/dL (ref 30.0–36.0)
MCV: 90.9 fL (ref 80.0–100.0)
Monocytes Absolute: 0.4 10*3/uL (ref 0.1–1.0)
Monocytes Relative: 7 %
Neutro Abs: 4.5 10*3/uL (ref 1.7–7.7)
Neutrophils Relative %: 67 %
Platelet Count: 203 10*3/uL (ref 150–400)
RBC: 4.4 MIL/uL (ref 3.87–5.11)
RDW: 13.6 % (ref 11.5–15.5)
WBC Count: 6.6 10*3/uL (ref 4.0–10.5)
nRBC: 0 % (ref 0.0–0.2)

## 2022-08-01 LAB — GENETIC SCREENING ORDER

## 2022-08-01 MED ORDER — KETOROLAC TROMETHAMINE 15 MG/ML IJ SOLN: 15.0000 mg | Freq: Once | INTRAMUSCULAR | Status: AC

## 2022-08-01 NOTE — Telephone Encounter (Signed)
Dr. Stanford Breed, request for lumpectomy clearance received on patient you recently saw on 06/08/22. Could you please comment on cardiac risk for upcoming procedure and route your response to p cv div preop?  Thank you, Sharyn Lull

## 2022-08-01 NOTE — Progress Notes (Signed)
[name]  Patient Care Team: Chesley Noon, MD as PCP - General (Family Medicine) Stanford Breed Denice Bors, MD as PCP - Cardiology (Cardiology) Benay Pike, MD as Consulting Physician (Hematology and Oncology) Jovita Kussmaul, MD as Consulting Physician (General Surgery) Kyung Rudd, MD as Consulting Physician (Radiation Oncology) Mauro Kaufmann, RN as Oncology Nurse Navigator Rockwell Germany, RN as Oncology Nurse Navigator  CHIEF COMPLAINTS/PURPOSE OF CONSULTATION:  Newly diagnosed breast cancer  HISTORY OF PRESENTING ILLNESS:  Lorraine Kemp 72 y.o. female is here because of recent diagnosis of right breast DCIS  I reviewed her records extensively and collaborated the history with the patient.  SUMMARY OF ONCOLOGIC HISTORY: Oncology History  Ductal carcinoma in situ (DCIS) of right breast  07/03/2022 Mammogram   Bilateral screening mammogram showed further evaluation suggested for calcifications in the right breast.  Right breast diagnostic mammogram showed pleomorphic calcifications in the upper breast at posterior depth spanning about 1.9 x 2 x 1.1 cm which are linear in morphology.   07/18/2022 Pathology Results   Right breast needle core biopsy upper outer quadrant showed DCIS comedo, high nuclear grade, necrosis present, calcs present prognostic showed ER 95% positive strong staining PR 95% positive strong staining   07/31/2022 Initial Diagnosis   Ductal carcinoma in situ (DCIS) of right breast    This is a very pleasant 72 year old female patient with ER/PR positive right breast DCIS referred to breast [place] for additional recommendations.  She arrived today to the appointment with her husband.  Her son and her daughter are both on the phone. She is healthy at baseline except for history of mitral valve replacement, on chronic anticoagulation, also used hormone replacement therapy for about 10 to 15 years previously for menopause.  She denies any  known family history of breast cancer otherwise.  She follows up with her cardiologist on a routine basis.  Rest of the pertinent 10 point ROS reviewed and negative  MEDICAL HISTORY:  Past Medical History:  Diagnosis Date   Atrial fibrillation ([place])    Chronic diastolic congestive heart failure (HCC)    Complication of anesthesia    Depression    Hematuria    workup by Dr. Diona Fanti   Longstanding persistent atrial fibrillation ([place])    MITRAL REGURGITATION    Mitral stenosis    PONV (postoperative nausea and vomiting)    RHEUMATIC HEART DISEASE    S/P Minimally invasive maze operation for atrial fibrillation 03/28/2018   Complete bilateral atrial lesion set using crythermy and bipolar radiofrequency ablation with clipping of LA appendage via right mini thoracotomy   S/P minimally invasive mitral valve replacement with bioprosthetic valve 03/28/2018   31 mm [facility] Mitral stented bovine pericardial tissue valve    Thyroid nodule    Wrist fracture, left 2019   Dr. Sharol Given    SURGICAL HISTORY: Past Surgical History:  Procedure Laterality Date   ABDOMINAL AORTOGRAM N/A 03/04/2018   Procedure: ABDOMINAL AORTOGRAM;  Surgeon: Jettie Booze, MD;  Location: [place] [facility];  Service: Cardiovascular;  Laterality: N/A;   BREAST BIOPSY Right 07/18/2022   MM RT BREAST BX W LOC DEV 1ST LESION IMAGE BX SPEC STEREO GUIDE 07/18/2022 [facility] MAMMOGRAPHY   CARDIOVERSION     2000   CESAREAN SECTION     COLONOSCOPY     MINIMALLY INVASIVE MAZE PROCEDURE N/A 03/28/2018   Procedure: MINIMALLY INVASIVE MAZE PROCEDURE;  Surgeon: Rexene Alberts, MD;  Location: [place];  Service:  Open Heart Surgery;  Laterality: N/A;   MITRAL VALVE REPLACEMENT Right 03/28/2018   Procedure: MINIMALLY INVASIVE MITRAL VALVE (MV) REPLACEMENT. Magna Mitral Ease 40mm.;  Surgeon: Rexene Alberts, MD;  Location: [place];  Service: Open Heart Surgery;  Laterality: Right;  glutaraldehyde   PERCUTANEOUS BALLOON  VALVULOPLASTY  10/1998   [employer]   RIGHT/LEFT HEART CATH AND CORONARY ANGIOGRAPHY N/A 03/04/2018   Procedure: RIGHT/LEFT HEART CATH AND CORONARY ANGIOGRAPHY;  Surgeon: Jettie Booze, MD;  Location: [place] [facility];  Service: Cardiovascular;  Laterality: N/A;   screw in ankles  1989   right   TEE WITHOUT CARDIOVERSION N/A 12/31/2017   Procedure: TRANSESOPHAGEAL ECHOCARDIOGRAM (TEE);  Surgeon: Lelon Perla, MD;  Location[facility];  Service: Cardiovascular;  Laterality: N/A;   TEE WITHOUT CARDIOVERSION N/A 03/28/2018   Procedure: TRANSESOPHAGEAL ECHOCARDIOGRAM (TEE);  Surgeon: Rexene Alberts, MD;  Location: [place];  Service: Open Heart Surgery;  Laterality: N/A;   THYROIDECTOMY  05/30/2012   Procedure: THYROIDECTOMY;  Surgeon: Earnstine Regal, MD;  Location: [facility];  Service: General;  Laterality: N/A;  total thyroidectomy   TONSILLECTOMY  1956   TUBAL LIGATION      SOCIAL HISTORY: Social History   Socioeconomic History   Marital status: Married    Spouse name: Not on file   Number of children: 2   Years of education: Not on file   Highest education level: Not on file  Occupational History   Occupation: branch office administrator    Employer: [employer]  Tobacco Use   Smoking status: Never   Smokeless tobacco: Never  Vaping Use   Vaping Use: Never used  Substance and Sexual Activity   Alcohol use: Yes    Alcohol/week: 1.0 standard drink of alcohol    Types: 1 Glasses of wine per week    Comment: occas glass of wine weekly   Drug use: No   Sexual activity: Yes    Partners: Male    Birth control/protection: Surgical, Post-menopausal    Comment: BTL  Other Topics Concern   Not on file  Social History Narrative   Not on file   Social Determinants of Health   Financial Resource Strain: Not on file  Food Insecurity: Not on file  Transportation Needs: Not on file  Physical Activity: Not on file  Stress: Not on file  Social Connections: Not on  file  Intimate Partner Violence: Not on file    FAMILY HISTORY: Family History  Problem Relation Age of Onset   Lung cancer Father    Cancer Father        lung   Heart disease Mother        S/P CABG   Cancer Brother        brain   Hypertension Sister    Heart disease Maternal Grandmother     ALLERGIES:  is allergic to metoprolol succinate [metoprolol].  MEDICATIONS:  Current Outpatient Medications  Medication Sig Dispense Refill   amoxicillin (AMOXIL) 500 MG capsule Take 2,000 mg by mouth See admin instructions. Take 2000 mg by mouth 1 hour prior to dental appointment  2   apixaban (ELIQUIS) 5 MG TABS tablet TAKE 1 TABLET BY MOUTH TWICE A DAY 60 tablet 5   diltiazem (CARDIZEM CD) 120 MG 24 hr capsule TAKE 1 CAPSULE BY MOUTH EVERY DAY 90 capsule 2   DULoxetine (CYMBALTA) 30 MG capsule Take 1 capsule by mouth daily.     levothyroxine (SYNTHROID) 112 MCG  tablet Take 112 mcg by mouth daily.     Multiple Minerals-Vitamins (CALCIUM-MAGNESIUM-ZINC-D3 PO) Take 1 tablet by mouth daily.     No current facility-administered medications for this visit.    REVIEW OF SYSTEMS:   Constitutional: Denies fevers, chills or abnormal night sweats Eyes: Denies blurriness of vision, double vision or watery eyes Ears, nose, mouth, throat, and face: Denies mucositis or sore throat Respiratory: Denies cough, dyspnea or wheezes Cardiovascular: Denies palpitation, chest discomfort or lower extremity swelling Gastrointestinal:  Denies nausea, heartburn or change in bowel habits Skin: Denies abnormal skin rashes Lymphatics: Denies new lymphadenopathy or easy bruising Neurological:Denies numbness, tingling or new weaknesses Behavioral/Psych: Mood is stable, no new changes  Breast: Denies any palpable lumps or discharge All other systems were reviewed with the patient and are negative.  PHYSICAL EXAMINATION: ECOG PERFORMANCE STATUS: 0 - Asymptomatic  There were no vitals filed for this  visit. There were no vitals filed for this visit.  GENERAL:alert, no distress and comfortable LUNGS: clear to auscultation and percussion with normal breathing effort HEART: regular rate & rhythm and no murmurs and no lower extremity edema  PSYCH: alert & oriented x 3 with fluent speech NEURO: no focal motor/sensory deficits BREAST: Postbiopsy changes right breast.  No palpable mass.  No regional adenopathy. LABORATORY DATA:  I have reviewed the data as listed Lab Results  Component Value Date   WBC 6.9 07/31/2021   HGB 14.6 07/31/2021   HCT 43.0 07/31/2021   MCV 87 07/31/2021   PLT 210 07/31/2021   Lab Results  Component Value Date   NA 140 07/31/2021   K 4.7 07/31/2021   CL 103 07/31/2021   CO2 23 07/31/2021    RADIOGRAPHIC STUDIES: I have personally reviewed the radiological reports and agreed with the findings in the report.  ASSESSMENT AND PLAN:  Ductal carcinoma in situ (DCIS) of right breast This is a very pleasant 72 year old postmenopausal female patient with right breast ER/PR positive DCIS referred to breast [place] for additional recommendations.  We have discussed the following details about DCIS and antiestrogen therapy.  Pathology review: I discussed with the patient the difference between DCIS and invasive breast cancer. It is considered a precancerous lesion. DCIS is classified as a Stage 0 breast cancer. It is generally detected through mammograms as calcifications. We discussed the significance of grades and its impact on prognosis. We also discussed the importance of ER and PR receptors and their implications to adjuvant treatment options. Prognosis of DCIS dependence on grade and degree of comedo necrosis. It is anticipated that if not treated, 20-30% of DCIS can develop into invasive breast cancer.  Recommendation: 1. Breast conserving surgery 2. Followed by adjuvant radiation therapy 3. Followed by antiestrogen therapy with tamoxifen/aromatase inhibitors  based on menopausal status 5 years  Tamoxifen counseling: We discussed the risks and benefits of tamoxifen. These include but not limited to insomnia, hot flashes, mood changes, vaginal dryness, and weight gain. Although rare, serious side effects including endometrial cancer, risk of blood clots were also discussed. We strongly believe that the benefits far outweigh the risks. Patient understands these risks and consented to starting treatment. Planned treatment duration is 5 years.  Aromatase inhibitors counseling: We have discussed the mechanism of action of aromatase inhibitors today.  We have discussed adverse effects including but not limited to menopausal symptoms, increased risk of osteoporosis and fractures, cardiovascular events, arthralgias and myalgias.  We do believe that the benefits far outweigh the risks.  Plan treatment duration  of 5 years.  She will return to clinic after surgery and adjuvant radiation to initiate antiestrogen therapy.  Total time spent: 45 minutes including history, physical exam, review of records, breast [place] discussion, counseling and coordination of care All questions were answered. The patient knows to call the clinic with any problems, questions or concerns.    Benay Pike, MD 08/01/22

## 2022-08-01 NOTE — Telephone Encounter (Signed)
   Pre-operative Risk Assessment    Patient Name: Lorraine Kemp  DOB: Jan 31, 1951 MRN: [id number]      Request for Surgical Clearance    Procedure:   LUMPECTOMY  Date of Surgery:  Clearance TBD                                 Surgeon:  DR. Autumn Messing III Surgeon's Group or Practice Name:  [facility] Phone number:  [phone] Fax number:  [phone]   Type of Clearance Requested:   - Pharmacy:  Hold Apixaban (Eliquis) NOT INDICATED HOW LONG   Type of Anesthesia:  General    Additional requests/questions:    Astrid Divine   08/01/2022, 12:27 PM

## 2022-08-01 NOTE — Progress Notes (Signed)
[facility] Clinical Social Work  Initial Assessment   Lorraine Kemp is a 72 y.o. year old female accompanied by spouse, Richardson Landry. Clinical Social Work was referred by  [facility]  for assessment of psychosocial needs.   SDOH (Social Determinants of Health) assessments performed: Yes SDOH Interventions    Flowsheet Row Clinical Support from 08/01/2022 in [place] at [facility]  SDOH Interventions   Food Insecurity Interventions Intervention Not Indicated  Housing Interventions Intervention Not Indicated  Transportation Interventions Intervention Not Indicated  Utilities Interventions Intervention Not Indicated  Financial Strain Interventions Intervention Not Indicated       SDOH Screenings   Food Insecurity: No Food Insecurity (08/01/2022)  Housing: Low Risk  (08/01/2022)  Transportation Needs: No Transportation Needs (08/01/2022)  Utilities: Not At Risk (08/01/2022)  Depression (PHQ2-9): Low Risk  (03/01/2019)  Financial Resource Strain: Low Risk  (08/01/2022)  Tobacco Use: Low Risk  (08/01/2022)     Distress Screen completed: Yes    08/01/2022    1:27 PM  ONCBCN DISTRESS SCREENING  Screening Type Initial Screening  Distress experienced in past week (1-10) 5  Information Concerns Type Lack of info about treatment      Family/Social Information:  Housing Arrangement: patient lives with husband, Richardson Landry (married 53 years) Family members/support persons in your life? Family- husband, son, daughter, friends, church Transportation concerns: no  Employment: Retired.  Income source: Paediatric nurse concerns: No Type of concern: None Food access concerns: no Religious or spiritual practice: Yes-involved in church Services Currently in place:  [employer]  Coping/ Adjustment to diagnosis: Patient understands treatment plan and what happens next? yes, feels better after speaking with medical team and learning about the plan Patient reported  stressors:  no major stressors Patient enjoys time with family/ friends and volunteering at [employer] school, being involved at [employer] Current coping skills/ strengths: Ability for insight , Capable of independent living , Communication skills , and Supportive family/friends     SUMMARY: Current SDOH Barriers:  No major barriers noted today  Clinical Social Work Clinical Goal(s):  No clinical social work goals at this time  Interventions: Discussed common feeling and emotions when being diagnosed with cancer, and the importance of support during treatment Informed patient of the support team roles and support services at [facility] Provided [place] contact information and encouraged patient to call with any questions or concerns   Follow Up Plan: Patient will contact CSW with any support or resource needs Patient verbalizes understanding of plan: Yes    Perla Echavarria E Crytal Pensinger, LCSW

## 2022-08-01 NOTE — Telephone Encounter (Signed)
Lorraine Kemp was seen by a genetic counselor during the breast cancer multidisciplinary clinic on 08/01/2022. In addition to her personal history of breast cancer, she reports her father was diagnosed with lung cancer at age 74 (he smoked), her brother was diagnosed with brain cancer at age 25, her sister was diagnosed with lung cancer at 97 (she smoked), and one double first cousin was diagnosed with breast cancer at age 65. Lorraine Kemp does not meet NCCN criteria for genetic testing at this time. She was still offered genetic counseling and testing but declined. We encourage her to contact us if there are any changes to her personal or family history of cancer. If she meets NCCN criteria based on the updated personal/family history, she would be recommended to have genetic counseling and testing.   [name], MS, [facility] Genetic Counselor [email or web address] (P) [phone]

## 2022-08-01 NOTE — Telephone Encounter (Signed)
   Primary Cardiologist: Kirk Ruths, MD  Chart reviewed as part of pre-operative protocol coverage. Given past medical history and time since last visit, based on ACC/AHA guidelines, AARIAH ESPITIA would be at acceptable risk for the planned procedure without further cardiovascular testing.   Patient was advised that if she develops new symptoms prior to surgery to contact our office to arrange a follow-up appointment. She verbalized understanding.  Per office protocol, she may hold Eliquis for 2 days prior to procedure and should resume as soon as hemodynamically stable.  I will route this recommendation to the requesting party via Epic fax function and remove from pre-op pool.  Please call with questions.  Emmaline Life, NP-C  08/01/2022, 2:31 PM [address] Office [phone] Fax [phone]

## 2022-08-01 NOTE — Assessment & Plan Note (Addendum)
This is a very pleasant 72 year old postmenopausal female patient with right breast ER/PR positive DCIS referred to breast [place] for additional recommendations.  We have discussed the following details about DCIS and antiestrogen therapy.  Pathology review: I discussed with the patient the difference between DCIS and invasive breast cancer. It is considered a precancerous lesion. DCIS is classified as a Stage 0 breast cancer. It is generally detected through mammograms as calcifications. We discussed the significance of grades and its impact on prognosis. We also discussed the importance of ER and PR receptors and their implications to adjuvant treatment options. Prognosis of DCIS dependence on grade and degree of comedo necrosis. It is anticipated that if not treated, 20-30% of DCIS can develop into invasive breast cancer.  Recommendation: 1. Breast conserving surgery 2. Followed by adjuvant radiation therapy 3. Followed by antiestrogen therapy with tamoxifen/aromatase inhibitors based on menopausal status 5 years  Tamoxifen counseling: We discussed the risks and benefits of tamoxifen. These include but not limited to insomnia, hot flashes, mood changes, vaginal dryness, and weight gain. Although rare, serious side effects including endometrial cancer, risk of blood clots were also discussed. We strongly believe that the benefits far outweigh the risks. Patient understands these risks and consented to starting treatment. Planned treatment duration is 5 years.  Aromatase inhibitors counseling: We have discussed the mechanism of action of aromatase inhibitors today.  We have discussed adverse effects including but not limited to menopausal symptoms, increased risk of osteoporosis and fractures, cardiovascular events, arthralgias and myalgias.  We do believe that the benefits far outweigh the risks.  Plan treatment duration of 5 years.  She will return to clinic after surgery and adjuvant radiation to  initiate antiestrogen therapy.  I have given her printed information about both tamoxifen and anastrozole.  She is on chronic anticoagulation and she is not very worried about the DVT/PE risk with tamoxifen however she is understandably worried about endometrial changes and endometrial carcinoma with tamoxifen.  Thank you for consulting [place] in the care of this patient.  Please do not hesitate to contact us with any additional questions or concerns.

## 2022-08-02 ENCOUNTER — Other Ambulatory Visit: Payer: Self-pay | Admitting: *Deleted

## 2022-08-02 DIAGNOSIS — D0511 Intraductal carcinoma in situ of right breast: Secondary | ICD-10-CM

## 2022-08-06 ENCOUNTER — Other Ambulatory Visit: Payer: Self-pay | Admitting: General Surgery

## 2022-08-06 DIAGNOSIS — D0511 Intraductal carcinoma in situ of right breast: Secondary | ICD-10-CM

## 2022-08-09 ENCOUNTER — Telehealth: Payer: Self-pay | Admitting: *Deleted

## 2022-08-09 ENCOUNTER — Encounter: Payer: Self-pay | Admitting: *Deleted

## 2022-08-09 NOTE — Telephone Encounter (Signed)
Spoke with patient to follow up from 3/27 and assess navigation needs. Patient denies any questions or concerns at this time.   Encouraged her to call should anything arise. Patient verbalized understanding.

## 2022-08-20 NOTE — Pre-Procedure Instructions (Signed)
Surgical Instructions    Your procedure is scheduled on August 29, 2022.  Report to [facility] Main Entrance "A" at 9:30 A.M., then check in with the Admitting office.  Call this number if you have problems the morning of surgery:  [phone]  If you have any questions prior to your surgery date call [phone]: Open Monday-Friday 8am-4pm If you experience any cold or flu symptoms such as cough, fever, chills, shortness of breath, etc. between now and your scheduled surgery, please notify us at the above number.     Remember:  Do not eat after midnight the night before your surgery  You may drink clear liquids until 8:30 AM the morning of your surgery.   Clear liquids allowed are: Water, Non-Citrus Juices (without pulp), Carbonated Beverages, Clear Tea, Black Coffee Only (NO MILK, CREAM OR POWDERED CREAMER of any kind), and Gatorade.     Take these medicines the morning of surgery with A SIP OF WATER:  diltiazem (CARDIZEM CD)   DULoxetine (CYMBALTA)   levothyroxine (SYNTHROID)   acetaminophen (TYLENOL) - may take if needed   Follow your surgeon's instructions on when to stop apixaban (ELIQUIS).  If no instructions were given by your surgeon then you will need to call the office to get those instructions.     As of today, STOP taking any Aspirin (unless otherwise instructed by your surgeon) Aleve, Naproxen, Ibuprofen, Motrin, Advil, Goody's, BC's, all herbal medications, fish oil, and all vitamins.                     Do NOT Smoke (Tobacco/Vaping) for 24 hours prior to your procedure.  If you use a CPAP at night, you may bring your mask/headgear for your overnight stay.   Contacts, glasses, piercing's, hearing aid's, dentures or partials may not be worn into surgery, please bring cases for these belongings.    For patients admitted to the hospital, discharge time will be determined by your treatment team.   Patients discharged the day of surgery will not be allowed to drive  home, and someone needs to stay with them for 24 hours.  SURGICAL WAITING ROOM VISITATION Patients having surgery or a procedure may have no more than 2 support people in the waiting area - these visitors may rotate.   Children under the age of 80 must have an adult with them who is not the patient. If the patient needs to stay at the hospital during part of their recovery, the visitor guidelines for inpatient rooms apply. Pre-op nurse will coordinate an appropriate time for 1 support person to accompany patient in pre-op.  This support person may not rotate.   Please refer to the [facility] website for the visitor guidelines for Inpatients (after your surgery is over and you are in a regular room).    Special instructions:   [place]- Preparing For Surgery  Before surgery, you can play an important role. Because skin is not sterile, your skin needs to be as free of germs as possible. You can reduce the number of germs on your skin by washing with CHG (chlorahexidine gluconate) Soap before surgery.  CHG is an antiseptic cleaner which kills germs and bonds with the skin to continue killing germs even after washing.    Oral Hygiene is also important to reduce your risk of infection.  Remember - BRUSH YOUR TEETH THE MORNING OF SURGERY WITH YOUR REGULAR TOOTHPASTE  Please do not use if you have an allergy to CHG  or antibacterial soaps. If your skin becomes reddened/irritated stop using the CHG.  Do not shave (including legs and underarms) for at least 48 hours prior to first CHG shower. It is OK to shave your face.  Please follow these instructions carefully.   Shower the NIGHT BEFORE SURGERY and the MORNING OF SURGERY  If you chose to wash your hair, wash your hair first as usual with your normal shampoo.  After you shampoo, rinse your hair and body thoroughly to remove the shampoo.  Use CHG Soap as you would any other liquid soap. You can apply CHG directly to the skin and wash gently  with a scrungie or a clean washcloth.   Apply the CHG Soap to your body ONLY FROM THE NECK DOWN.  Do not use on open wounds or open sores. Avoid contact with your eyes, ears, mouth and genitals (private parts). Wash Face and genitals (private parts)  with your normal soap.   Wash thoroughly, paying special attention to the area where your surgery will be performed.  Thoroughly rinse your body with warm water from the neck down.  DO NOT shower/wash with your normal soap after using and rinsing off the CHG Soap.  Pat yourself dry with a CLEAN TOWEL.  Wear CLEAN PAJAMAS to bed the night before surgery  Place CLEAN SHEETS on your bed the night before your surgery  DO NOT SLEEP WITH PETS.   Day of Surgery: Take a shower with CHG soap. Do not wear jewelry or makeup Do not wear lotions, powders, perfumes/colognes, or deodorant. Do not shave 48 hours prior to surgery.  Men may shave face and neck. Do not bring valuables to the hospital.  [facility] is not responsible for any belongings or valuables. Do not wear nail polish, gel polish, artificial nails, or any other type of covering on natural nails (fingers and toes) If you have artificial nails or gel coating that need to be removed by a nail salon, please have this removed prior to surgery. Artificial nails or gel coating may interfere with anesthesia's ability to adequately monitor your vital signs.  Wear Clean/Comfortable clothing the morning of surgery Remember to brush your teeth WITH YOUR REGULAR TOOTHPASTE.   Please read over the following fact sheets that you were given.    If you received a COVID test during your pre-op visit  it is requested that you wear a mask when out in public, stay away from anyone that may not be feeling well and notify your surgeon if you develop symptoms. If you have been in contact with anyone that has tested positive in the last 10 days please notify you surgeon.

## 2022-08-21 ENCOUNTER — Other Ambulatory Visit: Payer: Self-pay

## 2022-08-21 ENCOUNTER — Encounter (HOSPITAL_COMMUNITY)
Admission: RE | Admit: 2022-08-21 | Discharge: 2022-08-21 | Disposition: A | Discharge status: Home / Self Care | Payer: Medicare HMO | Source: Ambulatory Visit | Attending: General Surgery | Admitting: General Surgery

## 2022-08-21 ENCOUNTER — Encounter (HOSPITAL_COMMUNITY): Payer: Self-pay

## 2022-08-21 DIAGNOSIS — I517 Cardiomegaly: Secondary | ICD-10-CM | POA: Diagnosis not present

## 2022-08-21 DIAGNOSIS — Z7901 Long term (current) use of anticoagulants: Secondary | ICD-10-CM | POA: Diagnosis not present

## 2022-08-21 DIAGNOSIS — I2510 Atherosclerotic heart disease of native coronary artery without angina pectoris: Secondary | ICD-10-CM | POA: Insufficient documentation

## 2022-08-21 DIAGNOSIS — I099 Rheumatic heart disease, unspecified: Secondary | ICD-10-CM | POA: Insufficient documentation

## 2022-08-21 DIAGNOSIS — I480 Paroxysmal atrial fibrillation: Secondary | ICD-10-CM | POA: Insufficient documentation

## 2022-08-21 DIAGNOSIS — Z952 Presence of prosthetic heart valve: Secondary | ICD-10-CM | POA: Diagnosis not present

## 2022-08-21 DIAGNOSIS — I440 Atrioventricular block, first degree: Secondary | ICD-10-CM | POA: Diagnosis not present

## 2022-08-21 HISTORY — DX: Cardiac arrhythmia, unspecified: I49.9

## 2022-08-21 HISTORY — DX: Hypothyroidism, unspecified: E03.9

## 2022-08-21 HISTORY — DX: Pneumonia, unspecified organism: J18.9

## 2022-08-21 NOTE — Progress Notes (Signed)
PCP - Dr. Antony Haste Cardiologist - Dr. Olga Millers  PPM/ICD - Denies Device Orders - n/a Rep Notified - n/a  Chest x-ray - n/a EKG - 06/08/2022 Stress Test - Per pt, 10+ years ago, result normal ECHO - 08/10/2021 Cardiac Cath - 03/04/2018  Sleep Study - Denies CPAP - n/a  No DM  Last dose of GLP1 agonist- n/a GLP1 instructions: n/a  Blood Thinner Instructions: Pt instructed by surgeon to hold Eliquis for 2 days. Her last dose will be April 21st. Aspirin Instructions: n/a  ERAS Protcol - Clear liquids until 0830 morning of surgery PRE-SURGERY Ensure or G2- n/a  COVID TEST- n/a   Anesthesia review: Yes. Cardiac Clearance and breast seed placement   Patient denies shortness of breath, fever, cough and chest pain at PAT appointment. Pt denies any respiratory illness/infection in the last two months.   All instructions explained to the patient, with a verbal understanding of the material. Patient agrees to go over the instructions while at home for a better understanding. Patient also instructed to self quarantine after being tested for COVID-19. The opportunity to ask questions was provided.

## 2022-08-22 NOTE — Anesthesia Preprocedure Evaluation (Addendum)
Anesthesia Evaluation  Patient identified by MRN, date of birth, ID band Patient awake    Reviewed: Allergy & Precautions, H&P , NPO status , Patient's Chart, lab work & pertinent test results  History of Anesthesia Complications (+) PONV and history of anesthetic complications  Airway Mallampati: II  TM Distance: >3 FB Neck ROM: Full    Dental no notable dental hx.    Pulmonary shortness of breath   Pulmonary exam normal breath sounds clear to auscultation       Cardiovascular +CHF  Normal cardiovascular exam+ dysrhythmias Atrial Fibrillation  Rhythm:Regular Rate:Normal     Neuro/Psych    Depression    negative neurological ROS  negative psych ROS   GI/Hepatic negative GI ROS, Neg liver ROS,,,  Endo/Other  Hypothyroidism    Renal/GU negative Renal ROS  negative genitourinary   Musculoskeletal negative musculoskeletal ROS (+)    Abdominal   Peds negative pediatric ROS (+)  Hematology negative hematology ROS (+)   Anesthesia Other Findings   Reproductive/Obstetrics negative OB ROS                             Anesthesia Physical Anesthesia Plan  ASA: 3  Anesthesia Plan: General   Post-op Pain Management: Tylenol PO (pre-op)*, Gabapentin PO (pre-op)* and Dilaudid IV   Induction: Intravenous  PONV Risk Score and Plan: 4 or greater and Ondansetron, Dexamethasone, Midazolam, Droperidol and Treatment may vary due to age or medical condition  Airway Management Planned: LMA  Additional Equipment:   Intra-op Plan:   Post-operative Plan: Extubation in OR  Informed Consent: I have reviewed the patients History and Physical, chart, labs and discussed the procedure including the risks, benefits and alternatives for the proposed anesthesia with the patient or authorized representative who has indicated his/her understanding and acceptance.     Dental advisory given  Plan Discussed  with: CRNA  Anesthesia Plan Comments: (PAT note by Antionette Poles, PA-C: Patient follows with cardiology for history of paroxysmal atrial fibrillation on Eliquis, MS and rheumatic heart disease s/p MVR 03/2018.  Cath October 2019 showed nonobstructive disease.  Dopplers November 2019 showed 1 to 39% bilateral stenosis.  Patient had minimally invasive mitral valve replacement with an Edwards magna mitral stented bovine pericardial tissue valve and minimally invasive maze procedure 11/19. Echocardiogram April 2023 showed normal LV function, moderate right ventricular enlargement, severe biatrial enlargement, previous mitral valve replacement with mean gradient 3 mmHg and no significant mitral regurgitation.  Preop clearance per telephone encounter by Eligha Bridegroom, NP on 08/01/2022, "Chart reviewed as part of pre-operative protocol coverage. Given past medical history and time since last visit, based on ACC/AHA guidelines, Lorraine Kemp would be at acceptable risk for the planned procedure without further cardiovascular testing.  Patient was advised that if she develops new symptoms prior to surgery to contact our office to arrange a follow-up appointment. She verbalized understanding. Per office protocol, she may hold Eliquis for 2 days prior to procedure and should resume as soon as hemodynamically stable."  Patient reports last dose Eliquis 08/26/2022.  Preop labs reviewed, WNL.  EKG 06/08/2022: Sinus rhythm with first-degree AV block.  Rate 85.  Septal infarct, age-indeterminate.  TTE 08/10/2021: 1. Left ventricular ejection fraction, by estimation, is 55 to 60%. The  left ventricle has normal function. The left ventricle has no regional  wall motion abnormalities. Left ventricular diastolic function could not  be evaluated.  2. Right ventricular systolic function is  normal. The right ventricular  size is moderately enlarged. There is normal pulmonary artery systolic  pressure. The estimated right  ventricular systolic pressure is 33.0 mmHg.  3. Left atrial size was severely dilated.  4. Right atrial size was severely dilated.  5. The mitral valve has been repaired/replaced. No evidence of mitral  valve regurgitation. The mean mitral valve gradient is 3.0 mmHg with  average heart rate of 78 bpm. There is a 31 mm present in the mitral  position. Echo findings are consistent with  normal structure and function of the mitral valve prosthesis.  6. The aortic valve is tricuspid. Aortic valve regurgitation is not  visualized. No aortic stenosis is present.  7. The inferior vena cava is dilated in size with >50% respiratory  variability, suggesting right atrial pressure of 8 mmHg.  8. Rhythm strip during this exam demostrated atrial fibrillation.   Comparison(s): Changes from prior study are noted. Afib now present.   Right/left heart cath 03/04/2018: ?  Mid LAD lesion is 25% stenosed. ? Prox LAD lesion is 25% stenosed. ? LV end diastolic pressure is normal. ? There is no aortic valve stenosis. ? Nonobstructive coronary artery disease. ? CO 5.37 L/min; CI 2.94; Ao sat 98%, PA sat 73%; PA pressure 33/15, mean PA 22 mm Hg; mean PCWP 16 mm Hg ? No significant aortoiliac disease noted on abdominal aortogram. ? Mitral valve area calculated: 1.76 cm2   Continue with plan for mitral valve surgery.    )        Anesthesia Quick Evaluation

## 2022-08-22 NOTE — Progress Notes (Signed)
Anesthesia Chart Review:  Patient follows with cardiology for history of paroxysmal atrial fibrillation on Eliquis, MS and rheumatic heart disease s/p MVR 03/2018.  Cath October 2019 showed nonobstructive disease.  Dopplers November 2019 showed 1 to 39% bilateral stenosis.  Patient had minimally invasive mitral valve replacement with an Edwards magna mitral stented bovine pericardial tissue valve and minimally invasive maze procedure 11/19. Echocardiogram April 2023 showed normal LV function, moderate right ventricular enlargement, severe biatrial enlargement, previous mitral valve replacement with mean gradient 3 mmHg and no significant mitral regurgitation.  Preop clearance per telephone encounter by Eligha Bridegroom, NP on 08/01/2022, "Chart reviewed as part of pre-operative protocol coverage. Given past medical history and time since last visit, based on ACC/AHA guidelines, Lorraine Kemp would be at acceptable risk for the planned procedure without further cardiovascular testing.  Patient was advised that if she develops new symptoms prior to surgery to contact our office to arrange a follow-up appointment. She verbalized understanding. Per office protocol, she may hold Eliquis for 2 days prior to procedure and should resume as soon as hemodynamically stable."  Patient reports last dose Eliquis 08/26/2022.  Preop labs reviewed, WNL.  EKG 06/08/2022: Sinus rhythm with first-degree AV block.  Rate 85.  Septal infarct, age-indeterminate.  TTE 08/10/2021:  1. Left ventricular ejection fraction, by estimation, is 55 to 60%. The  left ventricle has normal function. The left ventricle has no regional  wall motion abnormalities. Left ventricular diastolic function could not  be evaluated.   2. Right ventricular systolic function is normal. The right ventricular  size is moderately enlarged. There is normal pulmonary artery systolic  pressure. The estimated right ventricular systolic pressure is 33.0 mmHg.    3. Left atrial size was severely dilated.   4. Right atrial size was severely dilated.   5. The mitral valve has been repaired/replaced. No evidence of mitral  valve regurgitation. The mean mitral valve gradient is 3.0 mmHg with  average heart rate of 78 bpm. There is a 31 mm present in the mitral  position. Echo findings are consistent with  normal structure and function of the mitral valve prosthesis.   6. The aortic valve is tricuspid. Aortic valve regurgitation is not  visualized. No aortic stenosis is present.   7. The inferior vena cava is dilated in size with >50% respiratory  variability, suggesting right atrial pressure of 8 mmHg.   8. Rhythm strip during this exam demostrated atrial fibrillation.   Comparison(s): Changes from prior study are noted. Afib now present.   Right/left heart cath 03/04/2018:  Mid LAD lesion is 25% stenosed. Prox LAD lesion is 25% stenosed. LV end diastolic pressure is normal. There is no aortic valve stenosis. Nonobstructive coronary artery disease. CO 5.37 L/min; CI 2.94; Ao sat 98%, PA sat 73%; PA pressure 33/15, mean PA 22 mm Hg; mean PCWP 16 mm Hg No significant aortoiliac disease noted on abdominal aortogram. Mitral valve area calculated: 1.76 cm2   Continue with plan for mitral valve surgery.    Zannie Cove [facility] Short Stay Center/Anesthesiology Phone [phone] 08/22/2022 4:29 PM

## 2022-08-28 ENCOUNTER — Ambulatory Visit
Admission: RE | Admit: 2022-08-28 | Discharge: 2022-08-28 | Disposition: A | Discharge status: Home / Self Care | Payer: Medicare HMO | Source: Ambulatory Visit | Attending: General Surgery | Admitting: General Surgery

## 2022-08-28 DIAGNOSIS — D0511 Intraductal carcinoma in situ of right breast: Secondary | ICD-10-CM

## 2022-08-28 HISTORY — PX: BREAST BIOPSY: SHX20

## 2022-08-29 ENCOUNTER — Ambulatory Visit (HOSPITAL_COMMUNITY): Payer: Medicare HMO | Admitting: Physician Assistant

## 2022-08-29 ENCOUNTER — Ambulatory Visit (HOSPITAL_COMMUNITY)
Admission: RE | Admit: 2022-08-29 | Discharge: 2022-08-29 | Disposition: A | Discharge status: Home / Self Care | Payer: Medicare HMO | Attending: General Surgery | Admitting: General Surgery

## 2022-08-29 ENCOUNTER — Other Ambulatory Visit: Payer: Self-pay

## 2022-08-29 ENCOUNTER — Encounter (HOSPITAL_COMMUNITY)
Admission: RE | Disposition: A | Discharge status: Home / Self Care | Payer: Self-pay | Source: Home / Self Care | Attending: General Surgery

## 2022-08-29 ENCOUNTER — Ambulatory Visit (HOSPITAL_BASED_OUTPATIENT_CLINIC_OR_DEPARTMENT_OTHER): Payer: Medicare HMO | Admitting: Certified Registered Nurse Anesthetist

## 2022-08-29 ENCOUNTER — Ambulatory Visit
Admission: RE | Admit: 2022-08-29 | Discharge: 2022-08-29 | Disposition: A | Discharge status: Home / Self Care | Payer: Medicare HMO | Source: Ambulatory Visit | Attending: General Surgery | Admitting: General Surgery

## 2022-08-29 DIAGNOSIS — D0511 Intraductal carcinoma in situ of right breast: Secondary | ICD-10-CM

## 2022-08-29 DIAGNOSIS — Z8249 Family history of ischemic heart disease and other diseases of the circulatory system: Secondary | ICD-10-CM | POA: Insufficient documentation

## 2022-08-29 DIAGNOSIS — E039 Hypothyroidism, unspecified: Secondary | ICD-10-CM | POA: Diagnosis not present

## 2022-08-29 DIAGNOSIS — I4891 Unspecified atrial fibrillation: Secondary | ICD-10-CM

## 2022-08-29 DIAGNOSIS — I5032 Chronic diastolic (congestive) heart failure: Secondary | ICD-10-CM | POA: Insufficient documentation

## 2022-08-29 DIAGNOSIS — E890 Postprocedural hypothyroidism: Secondary | ICD-10-CM | POA: Diagnosis not present

## 2022-08-29 DIAGNOSIS — F32A Depression, unspecified: Secondary | ICD-10-CM | POA: Diagnosis not present

## 2022-08-29 DIAGNOSIS — Z803 Family history of malignant neoplasm of breast: Secondary | ICD-10-CM | POA: Insufficient documentation

## 2022-08-29 DIAGNOSIS — I4821 Permanent atrial fibrillation: Secondary | ICD-10-CM | POA: Insufficient documentation

## 2022-08-29 DIAGNOSIS — I509 Heart failure, unspecified: Secondary | ICD-10-CM | POA: Diagnosis not present

## 2022-08-29 DIAGNOSIS — Z7901 Long term (current) use of anticoagulants: Secondary | ICD-10-CM | POA: Insufficient documentation

## 2022-08-29 HISTORY — PX: BREAST LUMPECTOMY WITH RADIOACTIVE SEED LOCALIZATION: SHX6424

## 2022-08-29 SURGERY — BREAST LUMPECTOMY WITH RADIOACTIVE SEED LOCALIZATION
Anesthesia: General | Site: Breast | Laterality: Right

## 2022-08-29 MED ORDER — ONDANSETRON HCL 4 MG/2ML IJ SOLN
INTRAMUSCULAR | Status: DC | PRN
  Administered 2022-08-29: 4 mg via INTRAVENOUS

## 2022-08-29 MED ORDER — CHLORHEXIDINE GLUCONATE 0.12 % MT SOLN: 15.0000 mL | Freq: Once | OROMUCOSAL | Status: AC

## 2022-08-29 MED ORDER — 0.9 % SODIUM CHLORIDE (POUR BTL) OPTIME
TOPICAL | Status: DC | PRN
  Administered 2022-08-29: 1000 mL

## 2022-08-29 MED ORDER — BUPIVACAINE-EPINEPHRINE 0.25% -1:200000 IJ SOLN
INTRAMUSCULAR | Status: DC | PRN
  Administered 2022-08-29: 19 mL

## 2022-08-29 MED ORDER — CHLORHEXIDINE GLUCONATE 0.12 % MT SOLN
OROMUCOSAL | Status: AC
  Administered 2022-08-29: 15 mL via OROMUCOSAL
  Filled 2022-08-29: qty 15

## 2022-08-29 MED ORDER — CHLORHEXIDINE GLUCONATE CLOTH 2 % EX PADS: 6.0000 | MEDICATED_PAD | Freq: Once | CUTANEOUS | Status: DC

## 2022-08-29 MED ORDER — PHENYLEPHRINE 80 MCG/ML (10ML) SYRINGE FOR IV PUSH (FOR BLOOD PRESSURE SUPPORT)
PREFILLED_SYRINGE | INTRAVENOUS | Status: AC
  Filled 2022-08-29: qty 10

## 2022-08-29 MED ORDER — ACETAMINOPHEN 500 MG PO TABS
ORAL_TABLET | ORAL | Status: AC
  Administered 2022-08-29: 1000 mg via ORAL
  Filled 2022-08-29: qty 2

## 2022-08-29 MED ORDER — LIDOCAINE 2% (20 MG/ML) 5 ML SYRINGE
INTRAMUSCULAR | Status: DC | PRN
  Administered 2022-08-29: 60 mg via INTRAVENOUS

## 2022-08-29 MED ORDER — CEFAZOLIN SODIUM-DEXTROSE 2-4 GM/100ML-% IV SOLN
2.0000 g | INTRAVENOUS | Status: AC
  Administered 2022-08-29: 2 g via INTRAVENOUS

## 2022-08-29 MED ORDER — GABAPENTIN 100 MG PO CAPS
100.0000 mg | ORAL_CAPSULE | ORAL | Status: AC
  Administered 2022-08-29: 100 mg via ORAL

## 2022-08-29 MED ORDER — CEFAZOLIN SODIUM-DEXTROSE 2-4 GM/100ML-% IV SOLN
INTRAVENOUS | Status: AC
  Filled 2022-08-29: qty 100

## 2022-08-29 MED ORDER — GABAPENTIN 300 MG PO CAPS
ORAL_CAPSULE | ORAL | Status: AC
  Filled 2022-08-29: qty 1

## 2022-08-29 MED ORDER — EPHEDRINE SULFATE-NACL 50-0.9 MG/10ML-% IV SOSY
PREFILLED_SYRINGE | INTRAVENOUS | Status: DC | PRN
  Administered 2022-08-29 (×2): 5 mg via INTRAVENOUS
  Administered 2022-08-29: 10 mg via INTRAVENOUS
  Administered 2022-08-29: 5 mg via INTRAVENOUS

## 2022-08-29 MED ORDER — PROPOFOL 500 MG/50ML IV EMUL
INTRAVENOUS | Status: DC | PRN
  Administered 2022-08-29: 150 ug/kg/min via INTRAVENOUS

## 2022-08-29 MED ORDER — PHENYLEPHRINE 80 MCG/ML (10ML) SYRINGE FOR IV PUSH (FOR BLOOD PRESSURE SUPPORT)
PREFILLED_SYRINGE | INTRAVENOUS | Status: DC | PRN
  Administered 2022-08-29: 160 ug via INTRAVENOUS
  Administered 2022-08-29: 80 ug via INTRAVENOUS

## 2022-08-29 MED ORDER — DEXAMETHASONE SODIUM PHOSPHATE 10 MG/ML IJ SOLN
INTRAMUSCULAR | Status: DC | PRN
  Administered 2022-08-29: 10 mg via INTRAVENOUS

## 2022-08-29 MED ORDER — FENTANYL CITRATE (PF) 250 MCG/5ML IJ SOLN
INTRAMUSCULAR | Status: AC
  Filled 2022-08-29: qty 5

## 2022-08-29 MED ORDER — FENTANYL CITRATE (PF) 250 MCG/5ML IJ SOLN
INTRAMUSCULAR | Status: DC | PRN
  Administered 2022-08-29 (×2): 50 ug via INTRAVENOUS

## 2022-08-29 MED ORDER — ORAL CARE MOUTH RINSE: 15.0000 mL | Freq: Once | OROMUCOSAL | Status: AC

## 2022-08-29 MED ORDER — BUPIVACAINE-EPINEPHRINE (PF) 0.25% -1:200000 IJ SOLN
INTRAMUSCULAR | Status: AC
  Filled 2022-08-29: qty 30

## 2022-08-29 MED ORDER — LACTATED RINGERS IV SOLN: INTRAVENOUS | Status: DC

## 2022-08-29 MED ORDER — GABAPENTIN 100 MG PO CAPS
ORAL_CAPSULE | ORAL | Status: AC
  Filled 2022-08-29: qty 1

## 2022-08-29 MED ORDER — PROPOFOL 10 MG/ML IV BOLUS
INTRAVENOUS | Status: DC | PRN
  Administered 2022-08-29: 25 mg via INTRAVENOUS
  Administered 2022-08-29: 150 mg via INTRAVENOUS

## 2022-08-29 MED ORDER — OXYCODONE HCL 5 MG PO TABS
5.0000 mg | ORAL_TABLET | Freq: Four times a day (QID) | ORAL | 0 refills | Status: DC | PRN
Start: 2022-08-29 — End: 2022-09-20

## 2022-08-29 MED ORDER — ACETAMINOPHEN 500 MG PO TABS: 1000.0000 mg | ORAL_TABLET | ORAL | Status: AC

## 2022-08-29 MED ORDER — PROPOFOL 10 MG/ML IV BOLUS
INTRAVENOUS | Status: AC
  Filled 2022-08-29: qty 20

## 2022-08-29 SURGICAL SUPPLY — 30 items
ADH SKN CLS APL DERMABOND .7 (GAUZE/BANDAGES/DRESSINGS) ×1
APL PRP STRL LF DISP 70% ISPRP (MISCELLANEOUS) ×1
APPLIER CLIP 9.375 MED OPEN (MISCELLANEOUS)
APR CLP MED 9.3 20 MLT OPN (MISCELLANEOUS)
BAG COUNTER SPONGE SURGICOUNT (BAG) IMPLANT
BAG SPNG CNTER NS LX DISP (BAG)
CANISTER SUCT 3000ML PPV (MISCELLANEOUS) ×1 IMPLANT
CHLORAPREP W/TINT 26 (MISCELLANEOUS) ×1 IMPLANT
CLIP APPLIE 9.375 MED OPEN (MISCELLANEOUS) IMPLANT
COVER PROBE W GEL 5X96 (DRAPES) ×1 IMPLANT
COVER SURGICAL LIGHT HANDLE (MISCELLANEOUS) ×1 IMPLANT
DERMABOND ADVANCED .7 DNX12 (GAUZE/BANDAGES/DRESSINGS) ×1 IMPLANT
DEVICE DUBIN SPECIMEN MAMMOGRA (MISCELLANEOUS) ×1 IMPLANT
DRAPE CHEST BREAST 15X10 FENES (DRAPES) ×1 IMPLANT
ELECT COATED BLADE 2.86 ST (ELECTRODE) ×1 IMPLANT
ELECT REM PT RETURN 9FT ADLT (ELECTROSURGICAL) ×1
ELECTRODE REM PT RTRN 9FT ADLT (ELECTROSURGICAL) ×1 IMPLANT
GLOVE BIO SURGEON STRL SZ7.5 (GLOVE) ×2 IMPLANT
GOWN STRL REUS W/ TWL LRG LVL3 (GOWN DISPOSABLE) ×2 IMPLANT
GOWN STRL REUS W/TWL LRG LVL3 (GOWN DISPOSABLE) ×2
KIT BASIN OR (CUSTOM PROCEDURE TRAY) ×1 IMPLANT
KIT MARKER MARGIN INK (KITS) ×1 IMPLANT
NDL HYPO 25GX1X1/2 BEV (NEEDLE) ×1 IMPLANT
NEEDLE HYPO 25GX1X1/2 BEV (NEEDLE) ×1 IMPLANT
NS IRRIG 1000ML POUR BTL (IV SOLUTION) ×1 IMPLANT
PACK GENERAL/GYN (CUSTOM PROCEDURE TRAY) ×1 IMPLANT
SUT MNCRL AB 4-0 PS2 18 (SUTURE) ×1 IMPLANT
SUT VIC AB 3-0 SH 18 (SUTURE) ×1 IMPLANT
SYR CONTROL 10ML LL (SYRINGE) ×1 IMPLANT
TOWEL GREEN STERILE FF (TOWEL DISPOSABLE) ×1 IMPLANT

## 2022-08-29 NOTE — Anesthesia Procedure Notes (Signed)
Procedure Name: LMA Insertion Date/Time: 08/29/2022 10:55 AM  Performed by: Waynard Edwards, CRNAPre-anesthesia Checklist: Patient identified, Emergency Drugs available, Suction available and Patient being monitored Patient Re-evaluated:Patient Re-evaluated prior to induction Oxygen Delivery Method: Circle System Utilized Preoxygenation: Pre-oxygenation with 100% oxygen Induction Type: IV induction Ventilation: Mask ventilation without difficulty LMA: LMA inserted LMA Size: 4.0 Number of attempts: 1 Placement Confirmation: positive ETCO2 Tube secured with: Tape Dental Injury: Teeth and Oropharynx as per pre-operative assessment

## 2022-08-29 NOTE — Op Note (Signed)
08/29/2022  11:36 AM  PATIENT:  Lorraine Kemp  72 y.o. female  PRE-OPERATIVE DIAGNOSIS:  RIGHT BREAST DUCTAL CARCINOMA IN SITU  POST-OPERATIVE DIAGNOSIS:  RIGHT BREAST DUCTAL CARCINOMA IN SITU  PROCEDURE:  Procedure(s): RIGHT BREAST LUMPECTOMY WITH RADIOACTIVE SEED LOCALIZATION (Right)  SURGEON:  Surgeon(s) and Role:    * Griselda Miner, MD - Primary  PHYSICIAN ASSISTANT:   ASSISTANTS: none   ANESTHESIA:   local and general  EBL:  minimal   BLOOD ADMINISTERED:none  DRAINS: none   LOCAL MEDICATIONS USED:  MARCAINE     SPECIMEN:  Source of Specimen:  right breast tissue with additional lateral margin  DISPOSITION OF SPECIMEN:  PATHOLOGY  COUNTS:  YES  TOURNIQUET:  * No tourniquets in log *  DICTATION: .Dragon Dictation  After informed consent was obtained the patient was brought to the operating room and placed in the supine position on the operating table.  After adequate induction of general anesthesia the patient's right breast was prepped with ChloraPrep, allowed to dry, and draped in usual sterile manner.  An appropriate timeout was performed.  Previously in I-125 seed was placed in the upper outer quadrant to mark an area of ductal carcinoma in situ.  The neoprobe was 2 I-125 and the area of radioactivity was readily identified.  The area around this was infiltrated with quarter percent Marcaine.  The patient had a previous scar in the same location.  I elected to make an elliptical incision in the skin around the scar and overlying the area of the radioactive seed with a 15 blade knife.  The incision was carried through the skin and subcutaneous tissue sharply with the electrocautery.  Dissection was then carried widely around the radioactive seed while checking the area of radioactivity frequently.  This dissection was carried all the way to the muscle of the chest wall.  Once the specimen was removed it was oriented with the appropriate paint colors.  A specimen  radiograph was obtained that showed the clip and seed to be near the center of the specimen.  Based on the image I did elect to take an additional lateral margin and this was also marked appropriately.  All of the tissue was then sent to pathology for further evaluation.  Hemostasis was achieved using the Bovie electrocautery.  The wound was irrigated with saline and infiltrated with more quarter percent Marcaine.  The cavity was marked with clips.  The deep layer of the wound was then closed with interrupted 3-0 Vicryl stitches.  The skin was then closed with a running 4-0 Monocryl subcuticular stitch.  Dermabond dressings were applied.  The patient tolerated the procedure well.  At the end of the case all needle sponge and instrument counts were correct.  The patient was then awakened and taken to recovery in stable condition.  PLAN OF CARE: Discharge to home after PACU  PATIENT DISPOSITION:  PACU - hemodynamically stable.   Delay start of Pharmacological VTE agent (>24hrs) due to surgical blood loss or risk of bleeding: not applicable

## 2022-08-29 NOTE — H&P (Signed)
REFERRING PHYSICIAN: Lucia Bitter* PROVIDER: Lindell Noe, MD MRN: [id number] DOB: 1950-08-03 Subjective  Chief Complaint: Breast Cancer  History of Present Illness: Lorraine Kemp is a 72 y.o. female who is seen today as an office consultation for evaluation of Breast Cancer  We are asked to see the patient in consultation by Dr. Al Pimple to evaluate her for a new right breast cancer. The patient is a 72 year old white female who recently went for a routine screening mammogram. At that time she was found to have 2 cm of calcification in the upper portion of the right breast. 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 She does have atrial fibrillation and a mitral valve replacement for which she takes Eliquis. She does have a family history of breast cancer in a cousin. She does not smoke. She is followed by Dr. Jens Som in cardiology.  Review of Systems: A complete review of systems was obtained from the patient. I have reviewed this information and discussed as appropriate with the patient. See HPI as well for other ROS.  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 Qualifier: Diagnosis of By: Kem Parkinson Last Assessment & Plan: Repeat echocardiogram.  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  (CMS-HCC) Osteopenia after menopause Sinusitis chronic, ethmoidal Postprocedural hypothyroidism Dyspnea Cough Atypical nevus of upper back excluding scapular region Allergic rhinitis due to pollen Bleeds easily (CMS-HCC) Constipation  Past Surgical History: Procedure Laterality Date .Thyroidectomy 05/30/2012 Transesophageal echocardiogram 12/31/2017 mitral valve replacement 03/28/2018 Right chest   Allergies Allergen Reactions Metoprolol Other (See Comments) Fatigue , and sleep disturbances  Fatigue , and sleep disturbances  Current Outpatient Medications on File Prior to Visit Medication Sig Dispense Refill amoxicillin (AMOXIL) 500 MG capsule Take 500 mg by mouth (4 caps prior to the dentist) dilTIAZem (CARDIZEM CD) 120 MG XR capsule Take 120 mg by mouth once daily DULoxetine (CYMBALTA) 30 MG DR capsule Take 1 capsule by mouth once daily ELIQUIS 5 mg tablet Take 5 mg by mouth 2 (two) times daily levothyroxine (SYNTHROID) 112 MCG tablet Take 112 mcg by mouth once daily multivitamin tablet Take 1 tablet by mouth once daily buPROPion (WELLBUTRIN XL) 300 MG XL tablet Take 300 mg by mouth once daily calcium carb/vit D3/minerals (CALCIUM-VITAMIN D ORAL) Take by mouth once daily diltiazem (CARDIZEM CD) 240 MG CD capsule Take 240 mg by mouth once daily docosahexanoic acid/epa (FISH OIL ORAL) Take by mouth once daily levothyroxine (SYNTHROID, LEVOTHROID) 125 MCG tablet Take 125 mcg by mouth once daily Take on an empty stomach with a glass of water at least 30-60 minutes before breakfast. warfarin (COUMADIN) 2.5 MG tablet Take 2.5 mg by mouth once daily (tues and thurs) warfarin (COUMADIN) 5 MG tablet Take 5 mg by mouth as directed  No current facility-administered medications on file prior to visit.  Family History Problem Relation Age of Onset Coronary Artery Disease (Blocked arteries around heart) Mother High blood pressure (Hypertension) Mother Lung cancer Father Lung  cancer Sister High blood  pressure (Hypertension) Sister Brain cancer Brother Coronary Artery Disease (Blocked arteries around heart) Maternal Grandmother   Social History  Tobacco Use Smoking Status Never Smokeless Tobacco Never   Social History  Socioeconomic History Marital status: Married Tobacco Use Smoking status: Never Smokeless tobacco: Never Vaping Use Vaping Use: Never used Substance and Sexual Activity Alcohol use: Yes Comment: Socially 1-2 drinks/month Drug use: Never  Objective: There were no vitals filed for this visit. There is no height or weight on file to calculate BMI.  Physical Exam Vitals reviewed. Constitutional: General: She is not in acute distress. Appearance: Normal appearance. HENT: Head: Normocephalic and atraumatic. Right Ear: External ear normal. Left Ear: External ear normal. Nose: Nose normal. Mouth/Throat: Mouth: Mucous membranes are moist. Pharynx: Oropharynx is clear. Eyes: General: No scleral icterus. Extraocular Movements: Extraocular movements intact. Conjunctiva/sclera: Conjunctivae normal. Pupils: Pupils are equal, round, and reactive to light. Cardiovascular: Rate and Rhythm: Normal rate and regular rhythm. Pulses: Normal pulses. Heart sounds: Normal heart sounds. Pulmonary: Effort: Pulmonary effort is normal. No respiratory distress. Breath sounds: Normal breath sounds. Abdominal: General: Bowel sounds are normal. Palpations: Abdomen is soft. Tenderness: There is no abdominal tenderness. Musculoskeletal: General: No swelling, tenderness or deformity. Normal range of motion. Cervical back: Normal range of motion and neck supple. Skin: General: Skin is warm and dry. Coloration: Skin is not jaundiced. Neurological: General: No focal deficit present. Mental Status: She is alert and oriented to person, place, and time. Psychiatric: Mood and Affect: Mood normal. Behavior: Behavior normal.    Breast: There is  no palpable mass in either breast. There is no palpable axillary, supraclavicular, or cervical lymphadenopathy.  Labs, Imaging and Diagnostic Testing:  Assessment and Plan:  Diagnoses and all orders for this visit:  Ductal carcinoma in situ (DCIS) of right breast   The patient appears to have a 2 cm area of ductal carcinoma in situ in the upper portion of the right breast. I have discussed with her in detail the different options for treatment and at this point she favors breast conservation which I feel is very reasonable. She will not need a node evaluation. I have discussed with her in detail the risks and benefits of the operation as well as some of the technical aspects including the use of a radioactive seed for localization and she understands and wishes to proceed. She will meet with medical and radiation oncology to discuss adjuvant therapy. We will proceed with surgical scheduling

## 2022-08-29 NOTE — Transfer of Care (Signed)
Immediate Anesthesia Transfer of Care Note  Patient: Lorraine Kemp  Procedure(s) Performed: RIGHT BREAST LUMPECTOMY WITH RADIOACTIVE SEED LOCALIZATION (Right: Breast)  Patient Location: PACU  Anesthesia Type:General  Level of Consciousness: drowsy and patient cooperative  Airway & Oxygen Therapy: Patient Spontanous Breathing and Patient connected to nasal cannula oxygen  Post-op Assessment: Report given to RN and Post -op Vital signs reviewed and stable  Post vital signs: Reviewed and stable  Last Vitals:  Vitals Value Taken Time  BP 117/55 08/29/22 1145  Temp    Pulse 75 08/29/22 1148  Resp 13 08/29/22 1148  SpO2 97 % 08/29/22 1148  Vitals shown include unvalidated device data.  Last Pain:  Vitals:   08/29/22 0956  TempSrc: Oral  PainSc:          Complications: No notable events documented.

## 2022-08-29 NOTE — Anesthesia Postprocedure Evaluation (Signed)
Anesthesia Post Note  Patient: Lorraine Kemp  Procedure(s) Performed: RIGHT BREAST LUMPECTOMY WITH RADIOACTIVE SEED LOCALIZATION (Right: Breast)     Patient location during evaluation: PACU Anesthesia Type: General Level of consciousness: awake and alert Pain management: pain level controlled Vital Signs Assessment: post-procedure vital signs reviewed and stable Respiratory status: spontaneous breathing, nonlabored ventilation and respiratory function stable Cardiovascular status: blood pressure returned to baseline and stable Postop Assessment: no apparent nausea or vomiting Anesthetic complications: no   No notable events documented.  Last Vitals:  Vitals:   08/29/22 1215 08/29/22 1230  BP: 134/60 130/67  Pulse: 69 71  Resp: 12 (!) 21  Temp:  36.4 C  SpO2: 97% 94%    Last Pain:  Vitals:   08/29/22 1230  TempSrc:   PainSc: 0-No pain                 Lowella Curb

## 2022-08-29 NOTE — Interval H&P Note (Signed)
History and Physical Interval Note:  08/29/2022 10:32 AM  Lorraine Kemp  has presented today for surgery, with the diagnosis of RIGHT BREAST DCIS.  The various methods of treatment have been discussed with the patient and family. After consideration of risks, benefits and other options for treatment, the patient has consented to  Procedure(s): RIGHT BREAST LUMPECTOMY WITH RADIOACTIVE SEED LOCALIZATION (Right) as a surgical intervention.  The patient's history has been reviewed, patient examined, no change in status, stable for surgery.  I have reviewed the patient's chart and labs.  Questions were answered to the patient's satisfaction.     Chevis Pretty III

## 2022-08-30 ENCOUNTER — Encounter (HOSPITAL_COMMUNITY): Payer: Self-pay | Admitting: General Surgery

## 2022-08-30 LAB — SURGICAL PATHOLOGY

## 2022-09-04 ENCOUNTER — Encounter: Payer: Self-pay | Admitting: *Deleted

## 2022-09-19 NOTE — Progress Notes (Signed)
Radiation Oncology         [phone]) [phone] ________________________________  Name: Lorraine Kemp        MRN: [id number]  Date of Service: 09/20/2022 DOB: [birth year]  CC:Eartha Inch, MD  Rachel Moulds, MD     REFERRING PHYSICIAN: Rachel Moulds, MD   DIAGNOSIS: The encounter diagnosis was Ductal carcinoma in situ (DCIS) of right breast.   HISTORY OF PRESENT ILLNESS: Lorraine Kemp is a 72 y.o. female originally seen in the multidisciplinary breast clinic for a new diagnosis of right breast cancer. The patient was noted to have calcifications in her right breast on screening mammogram. Further evaluation was recommended. Diagnostic mammogram on 07/13/22 showed calcifications in the UOQ spanning 2.0 cm in legnth. Accordingly patient underwent core needle biopsy on 07/18/22. 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 Prognostic indicators showed both ER and PR 95% positive with strong staining intensity.   Since her last visit, she underwent a right lumpectomy on 09/01/2022 that showed high-grade DCIS, comedo necrosis and calcifications measuring 8 mm in greatest extent without evidence of invasive disease.  Her resection margins were negative with her margins being more than 10 mm.  She is seen to discuss adjuvant radiation.  PREVIOUS RADIATION THERAPY: No   PAST MEDICAL HISTORY:  Past Medical History:  Diagnosis Date   Atrial fibrillation (HCC)    Cancer (HCC) 2024   Breast CA   Chronic diastolic congestive heart failure (HCC)    Complication of anesthesia    Depression    Dysrhythmia    A. Fib   Hematuria    workup by Dr. Retta Diones   Hypothyroidism    Thyroid removed   Longstanding persistent atrial fibrillation (HCC)    MITRAL REGURGITATION    Mitral stenosis    Pneumonia    Hx   PONV (postoperative nausea and vomiting)    RHEUMATIC HEART DISEASE    S/P Minimally invasive maze operation for atrial fibrillation  03/28/2018   Complete bilateral atrial lesion set using crythermy and bipolar radiofrequency ablation with clipping of LA appendage via right mini thoracotomy   S/P minimally invasive mitral valve replacement with bioprosthetic valve 03/28/2018   31 mm [facility] Mitral stented bovine pericardial tissue valve    Thyroid nodule    Wrist fracture, left 2019   Dr. Lajoyce Corners       PAST SURGICAL HISTORY: Past Surgical History:  Procedure Laterality Date   ABDOMINAL AORTOGRAM N/A 03/04/2018   Procedure: ABDOMINAL AORTOGRAM;  Surgeon: Corky Crafts, MD;  Location: [facility] INVASIVE CV LAB;  Service: Cardiovascular;  Laterality: N/A;   BREAST BIOPSY Right 07/18/2022   MM RT BREAST BX W LOC DEV 1ST LESION IMAGE BX SPEC STEREO GUIDE 07/18/2022 [facility] MAMMOGRAPHY   BREAST BIOPSY  08/28/2022   MM RT RADIOACTIVE SEED LOC MAMMO GUIDE 08/28/2022 [facility] MAMMOGRAPHY   BREAST LUMPECTOMY WITH RADIOACTIVE SEED LOCALIZATION Right 08/29/2022   Procedure: RIGHT BREAST LUMPECTOMY WITH RADIOACTIVE SEED LOCALIZATION;  Surgeon: Griselda Miner, MD;  Location: [facility] OR;  Service: General;  Laterality: Right;   CARDIOVERSION     2000   CATARACT EXTRACTION Bilateral    CESAREAN SECTION     COLONOSCOPY     FRACTURE SURGERY Right    Ankle with two screws   MINIMALLY INVASIVE MAZE PROCEDURE N/A 03/28/2018   Procedure: MINIMALLY INVASIVE MAZE PROCEDURE;  Surgeon: Purcell Nails, MD;  Location: [facility] OR;  Service: Open Heart Surgery;  Laterality:  N/A;   MITRAL VALVE REPLACEMENT Right 03/28/2018   Procedure: MINIMALLY INVASIVE MITRAL VALVE (MV) REPLACEMENT. Magna Mitral Ease 31mm.;  Surgeon: Purcell Nails, MD;  Location: [facility] OR;  Service: Open Heart Surgery;  Laterality: Right;  glutaraldehyde   PERCUTANEOUS BALLOON VALVULOPLASTY  10/06/1998   [employer]   RIGHT/LEFT HEART CATH AND CORONARY ANGIOGRAPHY N/A 03/04/2018   Procedure: RIGHT/LEFT HEART CATH AND CORONARY ANGIOGRAPHY;  Surgeon: Corky Crafts, MD;   Location: [facility] INVASIVE CV LAB;  Service: Cardiovascular;  Laterality: N/A;   screw in ankles  05/08/1987   right   TEE WITHOUT CARDIOVERSION N/A 12/31/2017   Procedure: TRANSESOPHAGEAL ECHOCARDIOGRAM (TEE);  Surgeon: Lewayne Bunting, MD;  Location: [facility] ENDOSCOPY;  Service: Cardiovascular;  Laterality: N/A;   TEE WITHOUT CARDIOVERSION N/A 03/28/2018   Procedure: TRANSESOPHAGEAL ECHOCARDIOGRAM (TEE);  Surgeon: Purcell Nails, MD;  Location: [facility] OR;  Service: Open Heart Surgery;  Laterality: N/A;   THYROIDECTOMY  05/30/2012   Procedure: THYROIDECTOMY;  Surgeon: Velora Heckler, MD;  Location: [facility] ORS;  Service: General;  Laterality: N/A;  total thyroidectomy   TONSILLECTOMY  05/07/1954   TUBAL LIGATION       FAMILY HISTORY:  Family History  Problem Relation Age of Onset   Heart disease Mother        S/P CABG   Lung cancer Father 17       smoked   Hypertension Sister    Lung cancer Sister 52       smoked   Brain cancer Brother 68   Heart disease Maternal Grandmother    Breast cancer Cousin 72       double first cousin     SOCIAL HISTORY:  reports that she has never smoked. She has never used smokeless tobacco. She reports current alcohol use of about 1.0 standard drink of alcohol per week. She reports that she does not use drugs.  The patient is married and lives in [place]. 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   ALLERGIES: Metoprolol succinate [metoprolol]   MEDICATIONS:  Current Outpatient Medications  Medication Sig Dispense Refill   acetaminophen (TYLENOL) 500 MG tablet Take 500-1,000 mg by mouth every 6 (six) hours as needed (pain.).     amoxicillin (AMOXIL) 500 MG capsule Take 2,000 mg by mouth See admin instructions. Take 4 capsules (2000 mg) by mouth 1 hour prior to dental appointment  2   apixaban (ELIQUIS) 5 MG TABS tablet TAKE 1 TABLET BY MOUTH TWICE A DAY 60 tablet 5   Calcium  Carb-Cholecalciferol (CALCIUM 600+D3 PO) Take 1 tablet by mouth in the morning.     diltiazem (CARDIZEM CD) 120 MG 24 hr capsule TAKE 1 CAPSULE BY MOUTH EVERY DAY 90 capsule 2   DULoxetine (CYMBALTA) 30 MG capsule Take 30 mg by mouth in the morning.     levothyroxine (SYNTHROID) 88 MCG tablet Take 88 mcg by mouth daily.     Multiple Minerals-Vitamins (CALCIUM-MAGNESIUM-ZINC-D3 PO) Take 1 tablet by mouth daily.     Multiple Vitamin (MULTIVITAMIN WITH MINERALS) TABS tablet Take 1 tablet by mouth in the morning.     oxyCODONE (ROXICODONE) 5 MG immediate release tablet Take 1 tablet (5 mg total) by mouth every 6 (six) hours as needed for severe pain. 10 tablet 0   No current facility-administered medications for this visit.     REVIEW OF SYSTEMS: On review of systems, the patient reports that she is  doing well since surgery. She feels like she's healing well and no other complaints are verbalized.      PHYSICAL EXAM:  Wt Readings from Last 3 Encounters:  08/29/22 165 lb (74.8 kg)  08/21/22 (P) 167 lb 14.4 oz (76.2 kg)  08/01/22 169 lb 12.8 oz (77 kg)   Temp Readings from Last 3 Encounters:  08/29/22 97.6 F (36.4 C)  08/21/22 (P) 98 F (36.7 C)  08/01/22 97.9 F (36.6 C) (Temporal)   BP Readings from Last 3 Encounters:  08/29/22 130/67  08/21/22 (!) (P) 130/56  08/01/22 129/61   Pulse Readings from Last 3 Encounters:  08/29/22 71  08/21/22 (P) 96  08/01/22 86    In general this is a well appearing  female in no acute distress. She's alert and oriented x4 and appropriate throughout the examination. Cardiopulmonary assessment is negative for acute distress and she exhibits normal effort.  Her right breast incision site is well-healed without erythema, separation or drainage.   ECOG = 0  0 - Asymptomatic (Fully active, able to carry on all predisease activities without restriction)  1 - Symptomatic but completely ambulatory (Restricted in physically strenuous activity but  ambulatory and able to carry out work of a light or sedentary nature. For example, light housework, office work)  2 - Symptomatic, <50% in bed during the day (Ambulatory and capable of all self care but unable to carry out any work activities. Up and about more than 50% of waking hours)  3 - Symptomatic, >50% in bed, but not bedbound (Capable of only limited self-care, confined to bed or chair 50% or more of waking hours)  4 - Bedbound (Completely disabled. Cannot carry on any self-care. Totally confined to bed or chair)  5 - Death   Santiago Glad MM, Creech RH, Tormey DC, et al. [phone]). "Toxicity and response criteria of the [facility]". Am. Evlyn Clines. Oncol. 5 (6): 649-55    LABORATORY DATA:  Lab Results  Component Value Date   WBC 6.6 08/01/2022   HGB 13.3 08/01/2022   HCT 40.0 08/01/2022   MCV 90.9 08/01/2022   PLT 203 08/01/2022   Lab Results  Component Value Date   NA 140 08/01/2022   K 4.3 08/01/2022   CL 104 08/01/2022   CO2 30 08/01/2022   Lab Results  Component Value Date   ALT 11 08/01/2022   AST 24 08/01/2022   ALKPHOS 72 08/01/2022   BILITOT 0.6 08/01/2022      RADIOGRAPHY: MM Breast Surgical Specimen  Result Date: 08/29/2022 CLINICAL DATA:  Status post lumpectomy today after earlier radioactive seed localization. EXAM: SPECIMEN RADIOGRAPH OF THE RIGHT BREAST COMPARISON:  Previous exam(s). FINDINGS: Status post excision of the right breast. The radioactive seed and biopsy marker clip appear present and completely intact within the periphery of the specimen. Findings, including peripheral location, discussed with the OR staff during the procedure. IMPRESSION: Specimen radiograph of the right breast. Electronically Signed   By: Bary Richard M.D.   On: 08/29/2022 11:27  MM RT RADIOACTIVE SEED LOC MAMMO GUIDE  Result Date: 08/28/2022 CLINICAL DATA:  Localization prior to surgery EXAM: MAMMOGRAPHIC GUIDED RADIOACTIVE SEED LOCALIZATION OF THE RIGHT  BREAST COMPARISON:  Previous exam(s). FINDINGS: Patient presents for radioactive seed localization prior to surgery. I met with the patient and we discussed the procedure of seed localization including benefits and alternatives. We discussed the high likelihood of a successful procedure. We discussed the risks of the procedure including infection, bleeding,  tissue injury and further surgery. We discussed the low dose of radioactivity involved in the procedure. Informed, written consent was given. The usual time-out protocol was performed immediately prior to the procedure. Using mammographic guidance, sterile technique, 1% lidocaine and an I-125 radioactive seed, the coil shaped clip was localized using a lateral approach. The follow-up mammogram images confirm the seed in the expected location and were marked for the surgeon. Follow-up survey of the patient confirms presence of the radioactive seed. Order number of I-125 seed:  [id number]. Total activity:  0.261 millicurie reference Date: August 10, 2022 The patient tolerated the procedure well and was [facility] from the [facility]. She was given instructions regarding seed removal. IMPRESSION: Radioactive seed localization right breast. No apparent complications. Electronically Signed   By: Gerome Sam III M.D.   On: 08/28/2022 14:44      IMPRESSION/PLAN: 1. High Grade, ER/PR positive DCIS of the right breast. Dr. Mitzi Hansen has reviewed her final pathology findings and today she and I reviewed the nature of early stage right breast disease. T she has done well since her surgery and we reviewed the rationale for external radiotherapy to the breast  to reduce risks of local recurrence followed by antiestrogen therapy. We discussed the risks, benefits, short, and long term effects of radiotherapy, as well as the curative intent, and the patient is interested in proceeding. Dr. Mitzi Hansen discusses the delivery and logistics of radiotherapy and anticipates a course of 4  weeks of radiotherapy to the right breast. Written consent is obtained and placed in the chart, a copy was provided to the patient.  She will simulate today.     In a visit lasting 45 minutes, greater than 50% of the time was spent face to face reviewing her case, as well as in preparation of, discussing, and coordinating the patient's care.    Osker Mason, [facility]     **Disclaimer: This note was dictated with voice recognition software. Similar sounding words can inadvertently be transcribed and this note may contain transcription errors which may not have been corrected upon publication of note.**

## 2022-09-20 ENCOUNTER — Encounter: Payer: Self-pay | Admitting: Radiation Oncology

## 2022-09-20 ENCOUNTER — Ambulatory Visit
Admission: RE | Admit: 2022-09-20 | Discharge: 2022-09-20 | Disposition: A | Discharge status: Home / Self Care | Payer: Medicare HMO | Source: Ambulatory Visit | Attending: Radiation Oncology | Admitting: Radiation Oncology

## 2022-09-20 VITALS — BP 111/75 | HR 84 | Temp 98.0°F | Resp 18 | Ht 65.0 in | Wt 169.8 lb

## 2022-09-20 DIAGNOSIS — Z801 Family history of malignant neoplasm of trachea, bronchus and lung: Secondary | ICD-10-CM | POA: Diagnosis not present

## 2022-09-20 DIAGNOSIS — E041 Nontoxic single thyroid nodule: Secondary | ICD-10-CM | POA: Insufficient documentation

## 2022-09-20 DIAGNOSIS — Z7901 Long term (current) use of anticoagulants: Secondary | ICD-10-CM | POA: Insufficient documentation

## 2022-09-20 DIAGNOSIS — I340 Nonrheumatic mitral (valve) insufficiency: Secondary | ICD-10-CM | POA: Insufficient documentation

## 2022-09-20 DIAGNOSIS — Z803 Family history of malignant neoplasm of breast: Secondary | ICD-10-CM | POA: Diagnosis not present

## 2022-09-20 DIAGNOSIS — Z170 Estrogen receptor positive status [ER+]: Secondary | ICD-10-CM | POA: Diagnosis not present

## 2022-09-20 DIAGNOSIS — Z510 Encounter for antineoplastic radiation therapy: Secondary | ICD-10-CM | POA: Diagnosis present

## 2022-09-20 DIAGNOSIS — I5032 Chronic diastolic (congestive) heart failure: Secondary | ICD-10-CM | POA: Insufficient documentation

## 2022-09-20 DIAGNOSIS — I4891 Unspecified atrial fibrillation: Secondary | ICD-10-CM | POA: Insufficient documentation

## 2022-09-20 DIAGNOSIS — Z79890 Hormone replacement therapy (postmenopausal): Secondary | ICD-10-CM | POA: Diagnosis not present

## 2022-09-20 DIAGNOSIS — Z79899 Other long term (current) drug therapy: Secondary | ICD-10-CM | POA: Insufficient documentation

## 2022-09-20 DIAGNOSIS — D0511 Intraductal carcinoma in situ of right breast: Secondary | ICD-10-CM | POA: Diagnosis present

## 2022-09-20 DIAGNOSIS — E039 Hypothyroidism, unspecified: Secondary | ICD-10-CM | POA: Diagnosis not present

## 2022-09-20 DIAGNOSIS — Z8781 Personal history of (healed) traumatic fracture: Secondary | ICD-10-CM | POA: Insufficient documentation

## 2022-09-20 NOTE — Progress Notes (Signed)
Nursing interview for a  72 yr old female w/ Ductal carcinoma in situ (DCIS) of right breast.  Patient identity verified x2.  Patient reports doing well. No other issues conveyed at this time.  Meaningful use complete.  Vitals- BP 111/75 (BP Location: Left Arm, Patient Position: Sitting, Cuff Size: Normal)   Pulse 84   Temp 98 F (36.7 C) (Oral)   Resp 18   Ht 5\' 5"  (1.651 m)   Wt 169 lb 12.8 oz (77 kg)   LMP 05/07/2002 (Approximate)   SpO2 97%   BMI 28.26 kg/m    This concludes the interview.   Ruel Favors, LPN

## 2022-09-26 ENCOUNTER — Encounter: Payer: Self-pay | Admitting: *Deleted

## 2022-09-26 ENCOUNTER — Telehealth: Payer: Self-pay | Admitting: Hematology and Oncology

## 2022-09-26 DIAGNOSIS — Z510 Encounter for antineoplastic radiation therapy: Secondary | ICD-10-CM | POA: Diagnosis not present

## 2022-09-26 DIAGNOSIS — D0511 Intraductal carcinoma in situ of right breast: Secondary | ICD-10-CM

## 2022-09-26 NOTE — Telephone Encounter (Signed)
Spoke with patient confirming upcoming appointment  

## 2022-10-03 ENCOUNTER — Other Ambulatory Visit: Payer: Self-pay | Admitting: Cardiology

## 2022-10-03 DIAGNOSIS — I4892 Unspecified atrial flutter: Secondary | ICD-10-CM

## 2022-10-03 NOTE — Telephone Encounter (Signed)
Prescription refill request for Eliquis received. Indication: Afib   Last office visit:06/08/22 ([place])  Scr: 0.97 (08/01/22)  Age: 72 Weight: 77kg  Appropriate dose. Refill sent.

## 2022-10-04 ENCOUNTER — Ambulatory Visit
Admission: RE | Admit: 2022-10-04 | Discharge: 2022-10-04 | Disposition: A | Discharge status: Home / Self Care | Payer: Medicare HMO | Source: Ambulatory Visit | Attending: Radiation Oncology | Admitting: Radiation Oncology

## 2022-10-04 ENCOUNTER — Other Ambulatory Visit: Payer: Self-pay

## 2022-10-04 DIAGNOSIS — Z510 Encounter for antineoplastic radiation therapy: Secondary | ICD-10-CM | POA: Diagnosis not present

## 2022-10-04 LAB — RAD ONC ARIA SESSION SUMMARY
Course Elapsed Days: 0
Plan Fractions Treated to Date: 1
Plan Prescribed Dose Per Fraction: 2.66 Gy
Plan Total Fractions Prescribed: 16
Plan Total Prescribed Dose: 42.56 Gy
Reference Point Dosage Given to Date: 2.66 Gy
Reference Point Session Dosage Given: 2.66 Gy
Session Number: 1

## 2022-10-05 ENCOUNTER — Ambulatory Visit
Admission: RE | Admit: 2022-10-05 | Discharge: 2022-10-05 | Disposition: A | Discharge status: Home / Self Care | Payer: Medicare HMO | Source: Ambulatory Visit | Attending: Radiation Oncology | Admitting: Radiation Oncology

## 2022-10-05 ENCOUNTER — Other Ambulatory Visit: Payer: Self-pay

## 2022-10-05 DIAGNOSIS — Z510 Encounter for antineoplastic radiation therapy: Secondary | ICD-10-CM | POA: Diagnosis not present

## 2022-10-05 DIAGNOSIS — D0511 Intraductal carcinoma in situ of right breast: Secondary | ICD-10-CM

## 2022-10-05 LAB — RAD ONC ARIA SESSION SUMMARY
Course Elapsed Days: 1
Plan Fractions Treated to Date: 2
Plan Prescribed Dose Per Fraction: 2.66 Gy
Plan Total Fractions Prescribed: 16
Plan Total Prescribed Dose: 42.56 Gy
Reference Point Dosage Given to Date: 5.32 Gy
Reference Point Session Dosage Given: 2.66 Gy
Session Number: 2

## 2022-10-05 MED ORDER — ALRA NON-METALLIC DEODORANT (RAD-ONC): 1.0000 | Freq: Once | TOPICAL | Status: DC

## 2022-10-05 MED ORDER — RADIAPLEXRX EX GEL: Freq: Once | CUTANEOUS | Status: AC

## 2022-10-05 NOTE — Progress Notes (Signed)
Pt here for patient teaching. Pt given Radiation and You booklet, skin care instructions, Alra deodorant, and Radiaplex gel. Reviewed areas of pertinence such as fatigue, hair loss, breast tenderness, breast swelling, cough, shortness of breath, earaches, and taste changes. Pt able to give teach back of to pat skin, use unscented/gentle soap, and drink plenty of water, apply Radiaplex bid, avoid applying anything to skin within 4 hours of treatment, avoid wearing an under wire bra, and to use an electric razor if they must shave. Pt verbalizes understanding of information given and will contact nursing with any questions or concerns.    [email or web address]/whattoexpect/index  This concludes the interview.   Ruel Favors, LPN

## 2022-10-08 ENCOUNTER — Ambulatory Visit
Admission: RE | Admit: 2022-10-08 | Discharge: 2022-10-08 | Disposition: A | Discharge status: Home / Self Care | Payer: Medicare HMO | Source: Ambulatory Visit | Attending: Radiation Oncology | Admitting: Radiation Oncology

## 2022-10-08 ENCOUNTER — Other Ambulatory Visit: Payer: Self-pay

## 2022-10-08 DIAGNOSIS — Z510 Encounter for antineoplastic radiation therapy: Secondary | ICD-10-CM | POA: Diagnosis present

## 2022-10-08 DIAGNOSIS — D0511 Intraductal carcinoma in situ of right breast: Secondary | ICD-10-CM | POA: Insufficient documentation

## 2022-10-08 LAB — RAD ONC ARIA SESSION SUMMARY
Course Elapsed Days: 4
Plan Fractions Treated to Date: 3
Plan Prescribed Dose Per Fraction: 2.66 Gy
Plan Total Fractions Prescribed: 16
Plan Total Prescribed Dose: 42.56 Gy
Reference Point Dosage Given to Date: 7.98 Gy
Reference Point Session Dosage Given: 2.66 Gy
Session Number: 3

## 2022-10-09 ENCOUNTER — Other Ambulatory Visit: Payer: Self-pay

## 2022-10-09 ENCOUNTER — Ambulatory Visit
Admission: RE | Admit: 2022-10-09 | Discharge: 2022-10-09 | Disposition: A | Discharge status: Home / Self Care | Payer: Medicare HMO | Source: Ambulatory Visit | Attending: Radiation Oncology | Admitting: Radiation Oncology

## 2022-10-09 DIAGNOSIS — Z510 Encounter for antineoplastic radiation therapy: Secondary | ICD-10-CM | POA: Diagnosis not present

## 2022-10-09 LAB — RAD ONC ARIA SESSION SUMMARY
Course Elapsed Days: 5
Plan Fractions Treated to Date: 4
Plan Prescribed Dose Per Fraction: 2.66 Gy
Plan Total Fractions Prescribed: 16
Plan Total Prescribed Dose: 42.56 Gy
Reference Point Dosage Given to Date: 10.64 Gy
Reference Point Session Dosage Given: 2.66 Gy
Session Number: 4

## 2022-10-10 ENCOUNTER — Other Ambulatory Visit: Payer: Self-pay

## 2022-10-10 ENCOUNTER — Ambulatory Visit
Admission: RE | Admit: 2022-10-10 | Discharge: 2022-10-10 | Disposition: A | Discharge status: Home / Self Care | Payer: Medicare HMO | Source: Ambulatory Visit | Attending: Radiation Oncology | Admitting: Radiation Oncology

## 2022-10-10 DIAGNOSIS — Z510 Encounter for antineoplastic radiation therapy: Secondary | ICD-10-CM | POA: Diagnosis not present

## 2022-10-10 LAB — RAD ONC ARIA SESSION SUMMARY
Course Elapsed Days: 6
Plan Fractions Treated to Date: 5
Plan Prescribed Dose Per Fraction: 2.66 Gy
Plan Total Fractions Prescribed: 16
Plan Total Prescribed Dose: 42.56 Gy
Reference Point Dosage Given to Date: 13.3 Gy
Reference Point Session Dosage Given: 2.66 Gy
Session Number: 5

## 2022-10-11 ENCOUNTER — Ambulatory Visit
Admission: RE | Admit: 2022-10-11 | Discharge: 2022-10-11 | Disposition: A | Discharge status: Home / Self Care | Payer: Medicare HMO | Source: Ambulatory Visit | Attending: Radiation Oncology | Admitting: Radiation Oncology

## 2022-10-11 ENCOUNTER — Other Ambulatory Visit: Payer: Self-pay

## 2022-10-11 DIAGNOSIS — Z510 Encounter for antineoplastic radiation therapy: Secondary | ICD-10-CM | POA: Diagnosis not present

## 2022-10-11 LAB — RAD ONC ARIA SESSION SUMMARY
Course Elapsed Days: 7
Plan Fractions Treated to Date: 6
Plan Prescribed Dose Per Fraction: 2.66 Gy
Plan Total Fractions Prescribed: 16
Plan Total Prescribed Dose: 42.56 Gy
Reference Point Dosage Given to Date: 15.96 Gy
Reference Point Session Dosage Given: 2.66 Gy
Session Number: 6

## 2022-10-12 ENCOUNTER — Other Ambulatory Visit: Payer: Self-pay

## 2022-10-12 ENCOUNTER — Ambulatory Visit
Admission: RE | Admit: 2022-10-12 | Discharge: 2022-10-12 | Disposition: A | Discharge status: Home / Self Care | Payer: Medicare HMO | Source: Ambulatory Visit | Attending: Radiation Oncology | Admitting: Radiation Oncology

## 2022-10-12 DIAGNOSIS — Z510 Encounter for antineoplastic radiation therapy: Secondary | ICD-10-CM | POA: Diagnosis not present

## 2022-10-12 LAB — RAD ONC ARIA SESSION SUMMARY
Course Elapsed Days: 8
Plan Fractions Treated to Date: 7
Plan Prescribed Dose Per Fraction: 2.66 Gy
Plan Total Fractions Prescribed: 16
Plan Total Prescribed Dose: 42.56 Gy
Reference Point Dosage Given to Date: 18.62 Gy
Reference Point Session Dosage Given: 2.66 Gy
Session Number: 7

## 2022-10-15 ENCOUNTER — Ambulatory Visit
Admission: RE | Admit: 2022-10-15 | Discharge: 2022-10-15 | Disposition: A | Discharge status: Home / Self Care | Payer: Medicare HMO | Source: Ambulatory Visit | Attending: Radiation Oncology | Admitting: Radiation Oncology

## 2022-10-15 ENCOUNTER — Other Ambulatory Visit: Payer: Self-pay

## 2022-10-15 DIAGNOSIS — Z510 Encounter for antineoplastic radiation therapy: Secondary | ICD-10-CM | POA: Diagnosis not present

## 2022-10-15 LAB — RAD ONC ARIA SESSION SUMMARY
Course Elapsed Days: 11
Plan Fractions Treated to Date: 8
Plan Prescribed Dose Per Fraction: 2.66 Gy
Plan Total Fractions Prescribed: 16
Plan Total Prescribed Dose: 42.56 Gy
Reference Point Dosage Given to Date: 21.28 Gy
Reference Point Session Dosage Given: 2.66 Gy
Session Number: 8

## 2022-10-16 ENCOUNTER — Ambulatory Visit
Admission: RE | Admit: 2022-10-16 | Discharge: 2022-10-16 | Disposition: A | Discharge status: Home / Self Care | Payer: Medicare HMO | Source: Ambulatory Visit | Attending: Radiation Oncology | Admitting: Radiation Oncology

## 2022-10-16 ENCOUNTER — Other Ambulatory Visit: Payer: Self-pay

## 2022-10-16 DIAGNOSIS — Z510 Encounter for antineoplastic radiation therapy: Secondary | ICD-10-CM | POA: Diagnosis not present

## 2022-10-16 LAB — RAD ONC ARIA SESSION SUMMARY
Course Elapsed Days: 12
Plan Fractions Treated to Date: 9
Plan Prescribed Dose Per Fraction: 2.66 Gy
Plan Total Fractions Prescribed: 16
Plan Total Prescribed Dose: 42.56 Gy
Reference Point Dosage Given to Date: 23.94 Gy
Reference Point Session Dosage Given: 2.66 Gy
Session Number: 9

## 2022-10-17 ENCOUNTER — Ambulatory Visit
Admission: RE | Admit: 2022-10-17 | Discharge: 2022-10-17 | Disposition: A | Discharge status: Home / Self Care | Payer: Medicare HMO | Source: Ambulatory Visit | Attending: Radiation Oncology | Admitting: Radiation Oncology

## 2022-10-17 ENCOUNTER — Other Ambulatory Visit: Payer: Self-pay

## 2022-10-17 DIAGNOSIS — Z510 Encounter for antineoplastic radiation therapy: Secondary | ICD-10-CM | POA: Diagnosis not present

## 2022-10-17 LAB — RAD ONC ARIA SESSION SUMMARY
Course Elapsed Days: 13
Plan Fractions Treated to Date: 10
Plan Prescribed Dose Per Fraction: 2.66 Gy
Plan Total Fractions Prescribed: 16
Plan Total Prescribed Dose: 42.56 Gy
Reference Point Dosage Given to Date: 26.6 Gy
Reference Point Session Dosage Given: 2.66 Gy
Session Number: 10

## 2022-10-18 ENCOUNTER — Other Ambulatory Visit: Payer: Self-pay

## 2022-10-18 ENCOUNTER — Ambulatory Visit
Admission: RE | Admit: 2022-10-18 | Discharge: 2022-10-18 | Disposition: A | Discharge status: Home / Self Care | Payer: Medicare HMO | Source: Ambulatory Visit | Attending: Radiation Oncology | Admitting: Radiation Oncology

## 2022-10-18 DIAGNOSIS — Z510 Encounter for antineoplastic radiation therapy: Secondary | ICD-10-CM | POA: Diagnosis not present

## 2022-10-18 LAB — RAD ONC ARIA SESSION SUMMARY
Course Elapsed Days: 14
Plan Fractions Treated to Date: 11
Plan Prescribed Dose Per Fraction: 2.66 Gy
Plan Total Fractions Prescribed: 16
Plan Total Prescribed Dose: 42.56 Gy
Reference Point Dosage Given to Date: 29.26 Gy
Reference Point Session Dosage Given: 2.66 Gy
Session Number: 11

## 2022-10-19 ENCOUNTER — Ambulatory Visit
Admission: RE | Admit: 2022-10-19 | Discharge: 2022-10-19 | Disposition: A | Discharge status: Home / Self Care | Payer: Medicare HMO | Source: Ambulatory Visit | Attending: Radiation Oncology | Admitting: Radiation Oncology

## 2022-10-19 ENCOUNTER — Ambulatory Visit: Payer: Medicare HMO | Admitting: Radiation Oncology

## 2022-10-19 ENCOUNTER — Other Ambulatory Visit: Payer: Self-pay

## 2022-10-19 DIAGNOSIS — Z510 Encounter for antineoplastic radiation therapy: Secondary | ICD-10-CM | POA: Diagnosis not present

## 2022-10-19 LAB — RAD ONC ARIA SESSION SUMMARY
Course Elapsed Days: 15
Plan Fractions Treated to Date: 12
Plan Prescribed Dose Per Fraction: 2.66 Gy
Plan Total Fractions Prescribed: 16
Plan Total Prescribed Dose: 42.56 Gy
Reference Point Dosage Given to Date: 31.92 Gy
Reference Point Session Dosage Given: 2.66 Gy
Session Number: 12

## 2022-10-22 ENCOUNTER — Other Ambulatory Visit: Payer: Self-pay

## 2022-10-22 ENCOUNTER — Ambulatory Visit
Admission: RE | Admit: 2022-10-22 | Discharge: 2022-10-22 | Disposition: A | Discharge status: Home / Self Care | Payer: Medicare HMO | Source: Ambulatory Visit | Attending: Radiation Oncology | Admitting: Radiation Oncology

## 2022-10-22 DIAGNOSIS — Z510 Encounter for antineoplastic radiation therapy: Secondary | ICD-10-CM | POA: Diagnosis not present

## 2022-10-22 LAB — RAD ONC ARIA SESSION SUMMARY
Course Elapsed Days: 18
Plan Fractions Treated to Date: 13
Plan Prescribed Dose Per Fraction: 2.66 Gy
Plan Total Fractions Prescribed: 16
Plan Total Prescribed Dose: 42.56 Gy
Reference Point Dosage Given to Date: 34.58 Gy
Reference Point Session Dosage Given: 2.66 Gy
Session Number: 13

## 2022-10-23 ENCOUNTER — Ambulatory Visit
Admission: RE | Admit: 2022-10-23 | Discharge: 2022-10-23 | Disposition: A | Discharge status: Home / Self Care | Payer: Medicare HMO | Source: Ambulatory Visit | Attending: Radiation Oncology | Admitting: Radiation Oncology

## 2022-10-23 ENCOUNTER — Other Ambulatory Visit: Payer: Self-pay

## 2022-10-23 DIAGNOSIS — Z510 Encounter for antineoplastic radiation therapy: Secondary | ICD-10-CM | POA: Diagnosis not present

## 2022-10-23 LAB — RAD ONC ARIA SESSION SUMMARY
Course Elapsed Days: 19
Plan Fractions Treated to Date: 14
Plan Prescribed Dose Per Fraction: 2.66 Gy
Plan Total Fractions Prescribed: 16
Plan Total Prescribed Dose: 42.56 Gy
Reference Point Dosage Given to Date: 37.24 Gy
Reference Point Session Dosage Given: 2.66 Gy
Session Number: 14

## 2022-10-24 ENCOUNTER — Other Ambulatory Visit: Payer: Self-pay

## 2022-10-24 ENCOUNTER — Ambulatory Visit
Admission: RE | Admit: 2022-10-24 | Discharge: 2022-10-24 | Disposition: A | Discharge status: Home / Self Care | Payer: Medicare HMO | Source: Ambulatory Visit | Attending: Radiation Oncology | Admitting: Radiation Oncology

## 2022-10-24 DIAGNOSIS — Z510 Encounter for antineoplastic radiation therapy: Secondary | ICD-10-CM | POA: Diagnosis not present

## 2022-10-24 LAB — RAD ONC ARIA SESSION SUMMARY
Course Elapsed Days: 20
Plan Fractions Treated to Date: 15
Plan Prescribed Dose Per Fraction: 2.66 Gy
Plan Total Fractions Prescribed: 16
Plan Total Prescribed Dose: 42.56 Gy
Reference Point Dosage Given to Date: 39.9 Gy
Reference Point Session Dosage Given: 2.66 Gy
Session Number: 15

## 2022-10-25 ENCOUNTER — Other Ambulatory Visit: Payer: Self-pay

## 2022-10-25 ENCOUNTER — Ambulatory Visit
Admission: RE | Admit: 2022-10-25 | Discharge: 2022-10-25 | Disposition: A | Discharge status: Home / Self Care | Payer: Medicare HMO | Source: Ambulatory Visit | Attending: Radiation Oncology | Admitting: Radiation Oncology

## 2022-10-25 DIAGNOSIS — Z510 Encounter for antineoplastic radiation therapy: Secondary | ICD-10-CM | POA: Diagnosis not present

## 2022-10-25 LAB — RAD ONC ARIA SESSION SUMMARY
Course Elapsed Days: 21
Plan Fractions Treated to Date: 16
Plan Prescribed Dose Per Fraction: 2.66 Gy
Plan Total Fractions Prescribed: 16
Plan Total Prescribed Dose: 42.56 Gy
Reference Point Dosage Given to Date: 42.56 Gy
Reference Point Session Dosage Given: 2.66 Gy
Session Number: 16

## 2022-10-26 ENCOUNTER — Ambulatory Visit
Admission: RE | Admit: 2022-10-26 | Discharge: 2022-10-26 | Disposition: A | Discharge status: Home / Self Care | Payer: Medicare HMO | Source: Ambulatory Visit | Attending: Radiation Oncology | Admitting: Radiation Oncology

## 2022-10-26 ENCOUNTER — Other Ambulatory Visit: Payer: Self-pay

## 2022-10-26 DIAGNOSIS — Z510 Encounter for antineoplastic radiation therapy: Secondary | ICD-10-CM | POA: Diagnosis not present

## 2022-10-26 LAB — RAD ONC ARIA SESSION SUMMARY
Course Elapsed Days: 22
Plan Fractions Treated to Date: 1
Plan Prescribed Dose Per Fraction: 2 Gy
Plan Total Fractions Prescribed: 4
Plan Total Prescribed Dose: 8 Gy
Reference Point Dosage Given to Date: 2 Gy
Reference Point Session Dosage Given: 2 Gy
Session Number: 17

## 2022-10-29 ENCOUNTER — Other Ambulatory Visit: Payer: Self-pay

## 2022-10-29 ENCOUNTER — Ambulatory Visit
Admission: RE | Admit: 2022-10-29 | Discharge: 2022-10-29 | Disposition: A | Discharge status: Home / Self Care | Payer: Medicare HMO | Source: Ambulatory Visit | Attending: Radiation Oncology | Admitting: Radiation Oncology

## 2022-10-29 DIAGNOSIS — Z510 Encounter for antineoplastic radiation therapy: Secondary | ICD-10-CM | POA: Diagnosis not present

## 2022-10-29 LAB — RAD ONC ARIA SESSION SUMMARY
Course Elapsed Days: 25
Plan Fractions Treated to Date: 2
Plan Prescribed Dose Per Fraction: 2 Gy
Plan Total Fractions Prescribed: 4
Plan Total Prescribed Dose: 8 Gy
Reference Point Dosage Given to Date: 4 Gy
Reference Point Session Dosage Given: 2 Gy
Session Number: 18

## 2022-10-30 ENCOUNTER — Other Ambulatory Visit: Payer: Self-pay

## 2022-10-30 ENCOUNTER — Ambulatory Visit
Admission: RE | Admit: 2022-10-30 | Discharge: 2022-10-30 | Disposition: A | Discharge status: Home / Self Care | Payer: Medicare HMO | Source: Ambulatory Visit | Attending: Radiation Oncology | Admitting: Radiation Oncology

## 2022-10-30 DIAGNOSIS — Z510 Encounter for antineoplastic radiation therapy: Secondary | ICD-10-CM | POA: Diagnosis not present

## 2022-10-30 LAB — RAD ONC ARIA SESSION SUMMARY
Course Elapsed Days: 26
Plan Fractions Treated to Date: 3
Plan Prescribed Dose Per Fraction: 2 Gy
Plan Total Fractions Prescribed: 4
Plan Total Prescribed Dose: 8 Gy
Reference Point Dosage Given to Date: 6 Gy
Reference Point Session Dosage Given: 2 Gy
Session Number: 19

## 2022-10-31 ENCOUNTER — Other Ambulatory Visit: Payer: Self-pay

## 2022-10-31 ENCOUNTER — Ambulatory Visit
Admission: RE | Admit: 2022-10-31 | Discharge: 2022-10-31 | Disposition: A | Discharge status: Home / Self Care | Payer: Medicare HMO | Source: Ambulatory Visit | Attending: Radiation Oncology | Admitting: Radiation Oncology

## 2022-10-31 DIAGNOSIS — Z510 Encounter for antineoplastic radiation therapy: Secondary | ICD-10-CM | POA: Diagnosis not present

## 2022-10-31 LAB — RAD ONC ARIA SESSION SUMMARY
Course Elapsed Days: 27
Plan Fractions Treated to Date: 4
Plan Prescribed Dose Per Fraction: 2 Gy
Plan Total Fractions Prescribed: 4
Plan Total Prescribed Dose: 8 Gy
Reference Point Dosage Given to Date: 8 Gy
Reference Point Session Dosage Given: 2 Gy
Session Number: 20

## 2022-11-01 NOTE — Radiation Completion Notes (Addendum)
  Radiation Oncology         [phone] ________________________________  Name: Lorraine Kemp MRN: [id number]  Date of Service: 10/31/2022  DOB: Dec 01, 1950  End of Treatment Note   Diagnosis:  High Grade, ER/PR positive DCIS of the right breast.   Intent: Curative     ==========DELIVERED PLANS==========  First Treatment Date: 2022-10-04 - Last Treatment Date: 2022-10-31   Plan Name: Breast_R Site: Breast, Right Technique: 3D Mode: Photon Dose Per Fraction: 2.66 Gy Prescribed Dose (Delivered / Prescribed): 42.56 Gy / 42.56 Gy Prescribed Fxs (Delivered / Prescribed): 16 / 16   Plan Name: Breast_R_Bst Site: Breast, Right Technique: 3D Mode: Photon Dose Per Fraction: 2 Gy Prescribed Dose (Delivered / Prescribed): 8 Gy / 8 Gy Prescribed Fxs (Delivered / Prescribed): 4 / 4     ==========ON TREATMENT VISIT DATES========== 2022-10-05, 2022-10-12, 2022-10-19, 2022-10-26   See weekly On Treatment Notes in Epic for details. The patient tolerated radiation. She developed fatigue and anticipated skin changes in the treatment field.   The patient will receive a call in about one month from the radiation oncology department. She will continue follow up with Dr. Al Pimple as well.      Osker Mason, PAC

## 2022-11-06 ENCOUNTER — Telehealth: Payer: Self-pay | Admitting: Hematology and Oncology

## 2022-11-06 ENCOUNTER — Inpatient Hospital Stay: Payer: Medicare HMO | Attending: Hematology and Oncology | Admitting: Hematology and Oncology

## 2022-11-06 ENCOUNTER — Other Ambulatory Visit: Payer: Self-pay

## 2022-11-06 DIAGNOSIS — D0511 Intraductal carcinoma in situ of right breast: Secondary | ICD-10-CM | POA: Diagnosis present

## 2022-11-06 DIAGNOSIS — Z79811 Long term (current) use of aromatase inhibitors: Secondary | ICD-10-CM | POA: Diagnosis not present

## 2022-11-06 DIAGNOSIS — Z7901 Long term (current) use of anticoagulants: Secondary | ICD-10-CM | POA: Insufficient documentation

## 2022-11-06 MED ORDER — ANASTROZOLE 1 MG PO TABS: 1.0000 mg | ORAL_TABLET | Freq: Every day | ORAL | 3 refills | Status: DC

## 2022-11-06 NOTE — Assessment & Plan Note (Addendum)
This is a very pleasant 72 year old postmenopausal female patient with right breast ER/PR positive DCIS referred to breast MDC for additional recommendations.  We have discussed the following details about DCIS and antiestrogen therapy. ions or concerns. She has completed radiation on 10/31/2022.  She is once again here to discuss about antiestrogen therapy.  During her last visit we have given her some printed information about tamoxifen and anastrozole.  She has read about both these options and wants to try anastrozole.  I reviewed the mechanism of action, adverse effects including but not limited to fatigue, hot flashes, vaginal dryness, arthralgias and bone density loss.  I have sent the prescription to the pharmacy of her choice. Bone density baseline with osteopenia.  Encouraged weightbearing exercises and vitamin D and calcium supplementation as tolerated.  If she continues to have worsening osteopenia or osteoporosis while she is on anastrozole, we can consider bisphosphonates.

## 2022-11-06 NOTE — Progress Notes (Signed)
[facility] CONSULT NOTE  Patient Care Team: Eartha Inch, MD as PCP - General (Family Medicine) Jens Som Madolyn Frieze, MD as PCP - Cardiology (Cardiology) Rachel Moulds, MD as Consulting Physician (Hematology and Oncology) Griselda Miner, MD as Consulting Physician (General Surgery) Dorothy Puffer, MD as Consulting Physician (Radiation Oncology) Pershing Proud, RN as Oncology Nurse Navigator Donnelly Angelica, RN as Oncology Nurse Navigator  CHIEF COMPLAINTS/PURPOSE OF CONSULTATION:  Newly diagnosed breast cancer  HISTORY OF PRESENTING ILLNESS:  Lorraine Kemp 72 y.o. female is here because of recent diagnosis of right breast DCIS  I reviewed her records extensively and collaborated the history with the patient.  SUMMARY OF ONCOLOGIC HISTORY: Oncology History  Ductal carcinoma in situ (DCIS) of right breast  07/03/2022 Mammogram   Bilateral screening mammogram showed further evaluation suggested for calcifications in the right breast.  Right breast diagnostic mammogram showed pleomorphic calcifications in the upper breast at posterior depth spanning about 1.9 x 2 x 1.1 cm which are linear in morphology.   07/18/2022 Pathology Results   Right breast needle core biopsy upper outer quadrant showed DCIS comedo, high nuclear grade, necrosis present, calcs present prognostic showed ER 95% positive strong staining PR 95% positive strong staining   07/31/2022 Initial Diagnosis   Ductal carcinoma in situ (DCIS) of right breast    This is a very pleasant 72 year old female patient with ER/PR positive right breast DCIS referred to breast [facility] for additional recommendations.  She is healthy at baseline except for history of mitral valve replacement, on chronic anticoagulation, also used hormone replacement therapy for about 10 to 15 years previously for menopause.   She had right breast lumpectomy and this showed high-grade DCIS with comedonecrosis, no evidence of invasive carcinoma,  negative margins.  Prognostic showed ER 95% positive strong staining and PR 95% positive strong staining She completed adjuvant radiation on October 31, 2022 and is here to initiate antiestrogen therapy.  Rest of the pertinent 10 point ROS reviewed and negative  MEDICAL HISTORY:  Past Medical History:  Diagnosis Date   Atrial fibrillation (HCC)    Cancer (HCC) 2024   Breast CA   Chronic diastolic congestive heart failure (HCC)    Complication of anesthesia    Depression    Dysrhythmia    A. Fib   Hematuria    workup by Dr. Retta Diones   Hypothyroidism    Thyroid removed   Longstanding persistent atrial fibrillation (HCC)    MITRAL REGURGITATION    Mitral stenosis    Pneumonia    Hx   PONV (postoperative nausea and vomiting)    RHEUMATIC HEART DISEASE    S/P Minimally invasive maze operation for atrial fibrillation 03/28/2018   Complete bilateral atrial lesion set using crythermy and bipolar radiofrequency ablation with clipping of LA appendage via right mini thoracotomy   S/P minimally invasive mitral valve replacement with bioprosthetic valve 03/28/2018   31 mm [facility] Mitral stented bovine pericardial tissue valve    Thyroid nodule    Wrist fracture, left 2019   Dr. Lajoyce Corners    SURGICAL HISTORY: Past Surgical History:  Procedure Laterality Date   ABDOMINAL AORTOGRAM N/A 03/04/2018   Procedure: ABDOMINAL AORTOGRAM;  Surgeon: Corky Crafts, MD;  Location: [facility] INVASIVE CV LAB;  Service: Cardiovascular;  Laterality: N/A;   BREAST BIOPSY Right 07/18/2022   MM RT BREAST BX W LOC DEV 1ST LESION IMAGE BX SPEC STEREO GUIDE 07/18/2022 [facility] MAMMOGRAPHY   BREAST BIOPSY  08/28/2022  MM RT RADIOACTIVE SEED LOC MAMMO GUIDE 08/28/2022 [facility] MAMMOGRAPHY   BREAST LUMPECTOMY WITH RADIOACTIVE SEED LOCALIZATION Right 08/29/2022   Procedure: RIGHT BREAST LUMPECTOMY WITH RADIOACTIVE SEED LOCALIZATION;  Surgeon: Griselda Miner, MD;  Location: [facility] OR;  Service: General;  Laterality: Right;    CARDIOVERSION     2000   CATARACT EXTRACTION Bilateral    CESAREAN SECTION     COLONOSCOPY     FRACTURE SURGERY Right    Ankle with two screws   MINIMALLY INVASIVE MAZE PROCEDURE N/A 03/28/2018   Procedure: MINIMALLY INVASIVE MAZE PROCEDURE;  Surgeon: Purcell Nails, MD;  Location: [facility] OR;  Service: Open Heart Surgery;  Laterality: N/A;   MITRAL VALVE REPLACEMENT Right 03/28/2018   Procedure: MINIMALLY INVASIVE MITRAL VALVE (MV) REPLACEMENT. Magna Mitral Ease 31mm.;  Surgeon: Purcell Nails, MD;  Location: [facility] OR;  Service: Open Heart Surgery;  Laterality: Right;  glutaraldehyde   PERCUTANEOUS BALLOON VALVULOPLASTY  10/06/1998   [employer]   RIGHT/LEFT HEART CATH AND CORONARY ANGIOGRAPHY N/A 03/04/2018   Procedure: RIGHT/LEFT HEART CATH AND CORONARY ANGIOGRAPHY;  Surgeon: Corky Crafts, MD;  Location: [facility] INVASIVE CV LAB;  Service: Cardiovascular;  Laterality: N/A;   screw in ankles  05/08/1987   right   TEE WITHOUT CARDIOVERSION N/A 12/31/2017   Procedure: TRANSESOPHAGEAL ECHOCARDIOGRAM (TEE);  Surgeon: Lewayne Bunting, MD;  Location: [facility] ENDOSCOPY;  Service: Cardiovascular;  Laterality: N/A;   TEE WITHOUT CARDIOVERSION N/A 03/28/2018   Procedure: TRANSESOPHAGEAL ECHOCARDIOGRAM (TEE);  Surgeon: Purcell Nails, MD;  Location: [facility] OR;  Service: Open Heart Surgery;  Laterality: N/A;   THYROIDECTOMY  05/30/2012   Procedure: THYROIDECTOMY;  Surgeon: Velora Heckler, MD;  Location: [facility] ORS;  Service: General;  Laterality: N/A;  total thyroidectomy   TONSILLECTOMY  05/07/1954   TUBAL LIGATION      SOCIAL HISTORY: Social History   Socioeconomic History   Marital status: Married    Spouse name: Not on file   Number of children: 2   Years of education: Not on file   Highest education level: Not on file  Occupational History   Occupation: branch office administrator    Employer: [employer]  Tobacco Use   Smoking status: Never   Smokeless tobacco: Never  Vaping Use    Vaping Use: Never used  Substance and Sexual Activity   Alcohol use: Yes    Alcohol/week: 1.0 standard drink of alcohol    Types: 1 Glasses of wine per week    Comment: occas glass of wine weekly   Drug use: No   Sexual activity: Yes    Partners: Male    Birth control/protection: Surgical, Post-menopausal    Comment: BTL  Other Topics Concern   Not on file  Social History Narrative   Not on file   Social Determinants of Health   Financial Resource Strain: Low Risk  (08/01/2022)   Overall Financial Resource Strain (CARDIA)    Difficulty of Paying Living Expenses: Not hard at all  Food Insecurity: No Food Insecurity (08/01/2022)   Hunger Vital Sign    Worried About Running Out of Food in the Last Year: Never true    Ran Out of Food in the Last Year: Never true  Transportation Needs: No Transportation Needs (08/01/2022)   PRAPARE - Administrator, Civil Service (Medical): No    Lack of Transportation (Non-Medical): No  Physical Activity: Not on file  Stress: Not on file  Social Connections: Not on file  Intimate Partner Violence: Not on file    FAMILY HISTORY: Family History  Problem Relation Age of Onset   Heart disease Mother        S/P CABG   Lung cancer Father 66       smoked   Hypertension Sister    Lung cancer Sister 49       smoked   Brain cancer Brother 21   Heart disease Maternal Grandmother    Breast cancer Cousin 27       double first cousin    ALLERGIES:  is allergic to metoprolol succinate [metoprolol].  MEDICATIONS:  Current Outpatient Medications  Medication Sig Dispense Refill   anastrozole (ARIMIDEX) 1 MG tablet Take 1 tablet (1 mg total) by mouth daily. 90 tablet 3   acetaminophen (TYLENOL) 500 MG tablet Take 500-1,000 mg by mouth every 6 (six) hours as needed (pain.).     amoxicillin (AMOXIL) 500 MG capsule Take 2,000 mg by mouth See admin instructions. Take 4 capsules (2000 mg) by mouth 1 hour prior to dental appointment  2   Calcium  Carb-Cholecalciferol (CALCIUM 600+D3 PO) Take 1 tablet by mouth in the morning.     diltiazem (CARDIZEM CD) 120 MG 24 hr capsule TAKE 1 CAPSULE BY MOUTH EVERY DAY 90 capsule 2   DULoxetine (CYMBALTA) 30 MG capsule Take 30 mg by mouth in the morning.     ELIQUIS 5 MG TABS tablet TAKE 1 TABLET BY MOUTH TWICE A DAY 60 tablet 5   levothyroxine (SYNTHROID) 88 MCG tablet Take 88 mcg by mouth daily.     Multiple Minerals-Vitamins (CALCIUM-MAGNESIUM-ZINC-D3 PO) Take 1 tablet by mouth daily.     Multiple Vitamin (MULTIVITAMIN WITH MINERALS) TABS tablet Take 1 tablet by mouth in the morning.     No current facility-administered medications for this visit.    REVIEW OF SYSTEMS:   Constitutional: Denies fevers, chills or abnormal night sweats Eyes: Denies blurriness of vision, double vision or watery eyes Ears, nose, mouth, throat, and face: Denies mucositis or sore throat Respiratory: Denies cough, dyspnea or wheezes Cardiovascular: Denies palpitation, chest discomfort or lower extremity swelling Gastrointestinal:  Denies nausea, heartburn or change in bowel habits Skin: Denies abnormal skin rashes Lymphatics: Denies new lymphadenopathy or easy bruising Neurological:Denies numbness, tingling or new weaknesses Behavioral/Psych: Mood is stable, no new changes  Breast: Denies any palpable lumps or discharge All other systems were reviewed with the patient and are negative.  PHYSICAL EXAMINATION: ECOG PERFORMANCE STATUS: 0 - Asymptomatic  Vitals:   11/06/22 1250  BP: (!) 139/55  Pulse: 89  Resp: 17  Temp: (!) 97.5 F (36.4 C)  SpO2: 100%   Filed Weights   11/06/22 1250  Weight: 172 lb 14.4 oz (78.4 kg)    GENERAL:alert, no distress and comfortable   LABORATORY DATA:  I have reviewed the data as listed Lab Results  Component Value Date   WBC 6.6 08/01/2022   HGB 13.3 08/01/2022   HCT 40.0 08/01/2022   MCV 90.9 08/01/2022   PLT 203 08/01/2022   Lab Results  Component Value Date    NA 140 08/01/2022   K 4.3 08/01/2022   CL 104 08/01/2022   CO2 30 08/01/2022    RADIOGRAPHIC STUDIES: I have personally reviewed the radiological reports and agreed with the findings in the report.  ASSESSMENT AND PLAN:  Ductal carcinoma in situ (DCIS) of right breast This is a very pleasant 72 year old postmenopausal female patient with right breast ER/PR positive DCIS referred  to breast MDC for additional recommendations.  We have discussed the following details about DCIS and antiestrogen therapy. ions or concerns. She has completed radiation on 10/31/2022.  She is once again here to discuss about antiestrogen therapy.  During her last visit we have given her some printed information about tamoxifen and anastrozole.  She has read about both these options and wants to try anastrozole.  I reviewed the mechanism of action, adverse effects including but not limited to fatigue, hot flashes, vaginal dryness, arthralgias and bone density loss.  I have sent the prescription to the pharmacy of her choice. Bone density baseline with osteopenia.  Encouraged weightbearing exercises and vitamin D and calcium supplementation as tolerated.  If she continues to have worsening osteopenia or osteoporosis while she is on anastrozole, we can consider bisphosphonates.   Total time spent: 30 minutes including history, physical exam, review of records, counseling and coordination of care All questions were answered. The patient knows to call the clinic with any problems, questions or concerns.    Rachel Moulds, MD 11/06/22

## 2022-12-31 ENCOUNTER — Ambulatory Visit
Admission: RE | Admit: 2022-12-31 | Discharge: 2022-12-31 | Disposition: A | Discharge status: Home / Self Care | Payer: Medicare HMO | Source: Ambulatory Visit | Attending: Hematology and Oncology | Admitting: Hematology and Oncology

## 2022-12-31 NOTE — Progress Notes (Signed)
  Radiation Oncology         [phone]) [phone] ________________________________  Name: Lorraine Kemp MRN: [id number]  Date of Service: 12/31/2022  DOB: 1950-05-21  Post Treatment Telephone Note  Diagnosis:  High Grade, ER/PR positive DCIS of the right breast. (as documented in provider EOT note)   The patient was available for call today.   Symptoms of fatigue have improved since completing therapy.  Symptoms of skin changes have improved since completing therapy.  The patient was encouraged to avoid sun exposure in the area of prior treatment for up to one year following radiation with either sunscreen or by the style of clothing worn in the sun.  The patient has scheduled follow up with her medical oncologist Dr. Al Pimple for ongoing surveillance, and was encouraged to call if she develops concerns or questions regarding radiation.   This concludes the interaction.  Ruel Favors, LPN

## 2023-02-16 NOTE — Progress Notes (Unsigned)
SURVIVORSHIP VISIT:  BRIEF ONCOLOGIC HISTORY:  Oncology History  Ductal carcinoma in situ (DCIS) of right breast  07/03/2022 Mammogram   Bilateral screening mammogram showed further evaluation suggested for calcifications in the right breast.  Right breast diagnostic mammogram showed pleomorphic calcifications in the upper breast at posterior depth spanning about 1.9 x 2 x 1.1 cm which are linear in morphology.   07/18/2022 Pathology Results   Right breast needle core biopsy upper outer quadrant showed DCIS comedo, high nuclear grade, necrosis present, calcs present prognostic showed ER 95% positive strong staining PR 95% positive strong staining   08/29/2022 Surgery   High grade DCIS, 0.8cm   10/04/2022 - 10/31/2022 Radiation Therapy   Plan Name: Breast_R Site: Breast, Right Technique: 3D Mode: Photon Dose Per Fraction: 2.66 Gy Prescribed Dose (Delivered / Prescribed): 42.56 Gy / 42.56 Gy Prescribed Fxs (Delivered / Prescribed): 16 / 16   Plan Name: Breast_R_Bst Site: Breast, Right Technique: 3D Mode: Photon Dose Per Fraction: 2 Gy Prescribed Dose (Delivered / Prescribed): 8 Gy / 8 Gy Prescribed Fxs (Delivered / Prescribed): 4 / 4   11/2022 -  Anti-estrogen oral therapy   Anastrozole daily     INTERVAL HISTORY:  Lorraine Kemp to review her survivorship care plan detailing her treatment course for breast cancer, as well as monitoring long-term side effects of that treatment, education regarding health maintenance, screening, and overall wellness and health promotion.     Overall, Lorraine Kemp reports feeling quite well   REVIEW OF SYSTEMS:  Review of Systems - Oncology Breast: Denies any new nodularity, masses, tenderness, nipple changes, or nipple discharge.       PAST MEDICAL/SURGICAL HISTORY:  Past Medical History:  Diagnosis Date   Atrial fibrillation (HCC)    Cancer (HCC) 2024   Breast CA   Chronic diastolic congestive heart failure (HCC)    Complication of anesthesia     Depression    Dysrhythmia    A. Fib   Hematuria    workup by Dr. Retta Diones   Hypothyroidism    Thyroid removed   Longstanding persistent atrial fibrillation (HCC)    MITRAL REGURGITATION    Mitral stenosis    Pneumonia    Hx   PONV (postoperative nausea and vomiting)    RHEUMATIC HEART DISEASE    S/P Minimally invasive maze operation for atrial fibrillation 03/28/2018   Complete bilateral atrial lesion set using crythermy and bipolar radiofrequency ablation with clipping of LA appendage via right mini thoracotomy   S/P minimally invasive mitral valve replacement with bioprosthetic valve 03/28/2018   31 mm [place] Mitral stented bovine pericardial tissue valve    Thyroid nodule    Wrist fracture, left 2019   Dr. Lajoyce Corners   Past Surgical History:  Procedure Laterality Date   ABDOMINAL AORTOGRAM N/A 03/04/2018   Procedure: ABDOMINAL AORTOGRAM;  Surgeon: Corky Crafts, MD;  Location: [facility] INVASIVE CV LAB;  Service: Cardiovascular;  Laterality: N/A;   BREAST BIOPSY Right 07/18/2022   MM RT BREAST BX W LOC DEV 1ST LESION IMAGE BX SPEC STEREO GUIDE 07/18/2022 [facility] MAMMOGRAPHY   BREAST BIOPSY  08/28/2022   MM RT RADIOACTIVE SEED LOC MAMMO GUIDE 08/28/2022 [facility] MAMMOGRAP[facility]   BREAST LUMPECTOMY WITH RADIOACTIVE SEED LOCALIZATION Right 08/29/2022   Procedure: RIGHT BREAST LUMPECTOMY WITH RADIOACTIVE SEED LOCALIZATION;  Surgeon: Griselda Miner, MD;  Location: [facility] OR;  Service: General;  Laterality: Right;   CARDIOVERSION     2000   CATARACT EXTRACTION Bilateral    CESAREAN  SECTION     COLONOSCOPY     FRACTURE SURGERY Right    Ankle with two screws   MINIMALLY INVASIVE MAZE PROCEDURE N/A 03/28/2018   Procedure: MINIMALLY INVASIVE MAZE PROCEDURE;  Surgeon: Purcell Nails, MD;  Location: [facility] OR;  Service: Open Heart Surgery;  Laterality: N/A;   MITRAL VALVE REPLACEMENT Right 03/28/2018   Procedure: MINIMALLY INVASIVE MITRAL VALVE (MV) REPLACEMENT. [place] Mitral Ease 31mm.;   Surgeon: Purcell Nails, MD;  Location: [facility] OR;  Service: Open Heart Surgery;  Laterality: Right;  glutaraldehyde   PERCUTANEOUS BALLOON VALVULOPLASTY  10/06/1998   [employer]   RIGHT/LEFT HEART CATH AND CORONARY ANGIOGRAPHY N/A 03/04/2018   Procedure: RIGHT/LEFT HEART CATH AND CORONARY ANGIOGRAPHY;  Surgeon: Corky Crafts, MD;  Location: [facility] INVASIVE CV LAB;  Service: Cardiovascular;  Laterality: N/A;   screw in ankles  05/08/1987   right   TEE WITHOUT CARDIOVERSION N/A 12/31/2017   Procedure: TRANSESOPHAGEAL ECHOCARDIOGRAM (TEE);  Surgeon: Lewayne Bunting, MD;  Location: [facility] ENDOSCOPY;  Service: Cardiovascular;  Laterality: N/A;   TEE WITHOUT CARDIOVERSION N/A 03/28/2018   Procedure: TRANSESOPHAGEAL ECHOCARDIOGRAM (TEE);  Surgeon: Purcell Nails, MD;  Location: [facility] OR;  Service: Open Heart Surgery;  Laterality: N/A;   THYROIDECTOMY  05/30/2012   Procedure: THYROIDECTOMY;  Surgeon: Velora Heckler, MD;  Location: [facility] ORS;  Service: General;  Laterality: N/A;  total thyroidectomy   TONSILLECTOMY  05/07/1954   TUBAL LIGATION       ALLERGIES:  Allergies  Allergen Reactions   Metoprolol Succinate [Metoprolol] Other (See Comments)     Fatigue , and sleep disturbances     CURRENT MEDICATIONS:  Outpatient Encounter Medications as of 02/18/2023  Medication Sig   acetaminophen (TYLENOL) 500 MG tablet Take 500-1,000 mg by mouth every 6 (six) hours as needed (pain.).   amoxicillin (AMOXIL) 500 MG capsule Take 2,000 mg by mouth See admin instructions. Take 4 capsules (2000 mg) by mouth 1 hour prior to dental appointment   anastrozole (ARIMIDEX) 1 MG tablet Take 1 tablet (1 mg total) by mouth daily.   Calcium Carb-Cholecalciferol (CALCIUM 600+D3 PO) Take 1 tablet by mouth in the morning.   diltiazem (CARDIZEM CD) 120 MG 24 hr capsule TAKE 1 CAPSULE BY MOUTH EVERY DAY   DULoxetine (CYMBALTA) 30 MG capsule Take 30 mg by mouth in the morning.   ELIQUIS 5 MG TABS tablet TAKE 1 TABLET BY  MOUTH TWICE A DAY   levothyroxine (SYNTHROID) 88 MCG tablet Take 88 mcg by mouth daily.   Multiple Minerals-Vitamins (CALCIUM-MAGNESIUM-ZINC-D3 PO) Take 1 tablet by mouth daily.   Multiple Vitamin (MULTIVITAMIN WITH MINERALS) TABS tablet Take 1 tablet by mouth in the morning.   No facility-administered encounter medications on file as of 02/18/2023.     ONCOLOGIC FAMILY HISTORY:  Family History  Problem Relation Age of Onset   Heart disease Mother        S/P CABG   Lung cancer Father 79       smoked   Hypertension Sister    Lung cancer Sister 56       smoked   Brain cancer Brother 37   Heart disease Maternal Grandmother    Breast cancer Cousin 39       double first cousin     SOCIAL HISTORY:  Social History   Socioeconomic History   Marital status: Married    Spouse name: Not on file   Number of children: 2   Years of education: Not  on file   Highest education level: Not on file  Occupational History   Occupation: branch office administrator    Employer: [employer]  Tobacco Use   Smoking status: Never   Smokeless tobacco: Never  Vaping Use   Vaping status: Never Used  Substance and Sexual Activity   Alcohol use: Yes    Alcohol/week: 1.0 standard drink of alcohol    Types: 1 Glasses of wine per week    Comment: occas glass of wine weekly   Drug use: No   Sexual activity: Yes    Partners: Male    Birth control/protection: Surgical, Post-menopausal    Comment: BTL  Other Topics Concern   Not on file  Social History Narrative   Not on file   Social Determinants of Health   Financial Resource Strain: Low Risk  (08/01/2022)   Overall Financial Resource Strain (CARDIA)    Difficulty of Paying Living Expenses: Not hard at all  Food Insecurity: No Food Insecurity (08/01/2022)   Hunger Vital Sign    Worried About Running Out of Food in the Last Year: Never true    Ran Out of Food in the Last Year: Never true  Transportation Needs: No Transportation Needs  (08/01/2022)   PRAPARE - Administrator, Civil Service (Medical): No    Lack of Transportation (Non-Medical): No  Physical Activity: Insufficiently Active (03/05/2022)   Received from [facility], [facility]   Exercise Vital Sign    Days of Exercise per Week: 3 days    Minutes of Exercise per Session: 30 min  Stress: No Stress Concern Present (03/05/2022)   Received from [facility], [facility] of Occupational Health - Occupational Stress Questionnaire    Feeling of Stress : Only a little  Social Connections: Socially Integrated (03/05/2022)   Received from [facility], [facility]   Social Network    How would you rate your social network (family, work, friends)?: Good participation with social networks  Intimate Partner Violence: Not At Risk (03/05/2022)   Received from [facility], [facility]   HITS    Over the last 12 months how often did your partner physically hurt you?: 1    Over the last 12 months how often did your partner insult you or talk down to you?: 1    Over the last 12 months how often did your partner threaten you with physical harm?: 1    Over the last 12 months how often did your partner scream or curse at you?: 1     OBSERVATIONS/OBJECTIVE:  LMP 05/07/2002 (Approximate)  GENERAL: Patient is a well appearing female in no acute distress HEENT:  Sclerae anicteric.  Oropharynx clear and moist. No ulcerations or evidence of oropharyngeal candidiasis. Neck is supple.  NODES:  No cervical, supraclavicular, or axillary lymphadenopathy palpated.  BREAST EXAM:  Deferred. LUNGS:  Clear to auscultation bilaterally.  No wheezes or rhonchi. HEART:  Regular rate and rhythm. No murmur appreciated. ABDOMEN:  Soft, nontender.  Positive, normoactive bowel sounds. No organomegaly palpated. MSK:  No focal spinal tenderness to palpation. Full range of motion bilaterally in the upper extremities. EXTREMITIES:  No peripheral edema.    SKIN:  Clear with no obvious rashes or skin changes. No nail dyscrasia. NEURO:  Nonfocal. Well oriented.  Appropriate affect.   LABORATORY DATA:  None for this visit.  DIAGNOSTIC IMAGING:  None for this visit.      ASSESSMENT AND PLAN:  Ms.. Kemp  is a pleasant 72 y.o. female with Stage 0 right breast DCIS, ER+/PR+, diagnosed in 06/2022, treated with lumpectomy, adjuvant radiation therapy, and anti-estrogen therapy with Anastrozole beginning in 11/2022.  She presents to the Survivorship Clinic for our initial meeting and routine follow-up post-completion of treatment for breast cancer.    1. Stage 0 right breast cancer:  Lorraine Kemp is continuing to recover from definitive treatment for breast cancer. She will follow-up with her medical oncologist, Dr.  Al Pimple in 6 months with history and physical exam per surveillance protocol.  She will continue her anti-estrogen therapy with Anastrozole. Thus far, she is tolerating the Anastrozole well, with minimal side effects. Her mammogram is due 06/2023; orders placed today.   Today, a comprehensive survivorship care plan and treatment summary was reviewed with the patient today detailing her breast cancer diagnosis, treatment course, potential late/long-term effects of treatment, appropriate follow-up care with recommendations for the future, and patient education resources.  A copy of this summary, along with a letter will be sent to the patient's primary care provider via mail/fax/In Basket message after today's visit.    #. Problem(s) at Visit______________  #. Bone health:  Given Ms. Lamarca's age/history of breast cancer and her current treatment regimen including anti-estrogen therapy with Anastrozole, she is at risk for bone demineralization.  Her last DEXA scan was 08/08/2021 demonstrating osteopenia with a t score of -1.7.  Repeat is recommended in 08/2023.  She was given education on specific activities to promote bone health.  #. Cancer screening:   Due to Ms. Freet's history and her age, she should receive screening for skin cancers, colon cancer, and gynecologic cancers.  The information and recommendations are listed on the patient's comprehensive care plan/treatment summary and were reviewed in detail with the patient.    #. Health maintenance and wellness promotion: Ms. Decaire was encouraged to consume 5-7 servings of fruits and vegetables per day. We reviewed the "Nutrition Rainbow" handout.  She was also encouraged to engage in moderate to vigorous exercise for 30 minutes per day most days of the week.  She was instructed to limit her alcohol consumption and continue to abstain from tobacco use.     #. Support services/counseling: It is not uncommon for this period of the patient's cancer care trajectory to be one of many emotions and stressors.   She was given information regarding our available services and encouraged to contact me with any questions or for help enrolling in any of our support group/programs.    Follow up instructions:    -Return to cancer center in 6 months for f/u with Dr. Al Pimple  -Mammogram due in 06/2023 -DEXA in 08/2023 -She is welcome to return back to the Survivorship Clinic at any time; no additional follow-up needed at this time.  -Consider referral back to survivorship as a long-term survivor for continued surveillance  The patient was provided an opportunity to ask questions and all were answered. The patient agreed with the plan and demonstrated an understanding of the instructions.   Total encounter time:*** minutes*in face-to-face visit time, chart review, lab review, care coordination, order entry, and documentation of the encounter time.    Lillard Anes, NP 02/16/23 3:09 PM Medical Oncology and Hematology [facility] [address] [place], [place] [address] Tel. [phone]    Fax. [phone]  *Total Encounter Time as defined by the Centers for Medicare and Medicaid Services  includes, in addition to the face-to-face time of a patient visit (documented in  the note above) non-face-to-face time: obtaining and reviewing outside history, ordering and reviewing medications, tests or procedures, care coordination (communications with other health care professionals or caregivers) and documentation in the medical record.

## 2023-02-18 ENCOUNTER — Inpatient Hospital Stay: Payer: Medicare HMO | Attending: Hematology and Oncology | Admitting: Adult Health

## 2023-02-18 ENCOUNTER — Encounter: Payer: Self-pay | Admitting: Adult Health

## 2023-02-18 VITALS — BP 142/65 | HR 82 | Temp 97.8°F | Resp 18 | Ht 65.0 in | Wt 177.3 lb

## 2023-02-18 DIAGNOSIS — N951 Menopausal and female climacteric states: Secondary | ICD-10-CM | POA: Insufficient documentation

## 2023-02-18 DIAGNOSIS — Z801 Family history of malignant neoplasm of trachea, bronchus and lung: Secondary | ICD-10-CM | POA: Diagnosis not present

## 2023-02-18 DIAGNOSIS — D0511 Intraductal carcinoma in situ of right breast: Secondary | ICD-10-CM | POA: Insufficient documentation

## 2023-02-18 DIAGNOSIS — Z803 Family history of malignant neoplasm of breast: Secondary | ICD-10-CM | POA: Insufficient documentation

## 2023-02-18 DIAGNOSIS — Z170 Estrogen receptor positive status [ER+]: Secondary | ICD-10-CM | POA: Insufficient documentation

## 2023-02-18 DIAGNOSIS — Z79811 Long term (current) use of aromatase inhibitors: Secondary | ICD-10-CM | POA: Insufficient documentation

## 2023-04-04 ENCOUNTER — Other Ambulatory Visit: Payer: Self-pay | Admitting: Cardiology

## 2023-05-08 ENCOUNTER — Other Ambulatory Visit: Payer: Self-pay | Admitting: Cardiology

## 2023-05-08 DIAGNOSIS — I4892 Unspecified atrial flutter: Secondary | ICD-10-CM

## 2023-05-09 NOTE — Telephone Encounter (Signed)
 Prescription refill request for Eliquis received. Indication: AF Last office visit: 06/08/22  Velta Addison MD Scr: 0.97 on 08/01/22  Epic Age: 73 Weight: 74.7kg  Based on above findings Eliquis 5mg  twice daily is the appropriate dose.  Refill approved.

## 2023-06-05 LAB — LAB REPORT - SCANNED: EGFR: 74

## 2023-06-10 ENCOUNTER — Ambulatory Visit: Payer: Medicare HMO | Admitting: Adult Health

## 2023-06-11 ENCOUNTER — Telehealth: Payer: Self-pay | Admitting: Hematology and Oncology

## 2023-06-11 NOTE — Telephone Encounter (Signed)
 Lorraine Kemp

## 2023-06-16 NOTE — Progress Notes (Deleted)
  Cardiology Office Note:  .   Date:  06/16/2023  ID:  Lorraine Kemp, Lorraine Kemp 12-May-1950, MRN [id number] PCP: Eartha Inch, MD  [facility] Providers Cardiologist:  Olga Millers, MD }   }   History of Present Illness: Lorraine Kitchen   KALE Kemp is a 73 y.o. female with hx of  atrial fibrillation, MS and rheumatic heart disease s/p MVR. She has had prior balloon valvuloplasty at [facility] in June 2000. TEE August 2019 showed normal LV function, biatrial enlargement, mild right ventricular enlargement, severe mitral stenosis with mitral valve area of 1 cm, mild to moderate mitral regurgitation and mild tricuspid regurgitation. Cardiac catheterization October 2019 showed nonobstructive coronary disease  Patient had minimally invasive mitral valve replacement with an [place] magna mitral stented bovine pericardial tissue valve and minimally invasive maze procedure 11/19. Echocardiogram April 2023 showed normal LV function, moderate right ventricular enlargement, severe biatrial enlargement, previous mitral valve replacement with mean gradient 3 mmHg and no significant mitral regurgitation.    Last seen by Dr. Jens Som on 06/08/2022 with no changes in her medications or plan testing.  She was to continue diltiazem and apixaban.  Hyperlipidemia was managed by primary care.  ROS: ***  Studies Reviewed: .        *** EKG Interpretation Date/Time:    Ventricular Rate:    PR Interval:    QRS Duration:    QT Interval:    QTC Calculation:   R Axis:      Text Interpretation:      Physical Exam:   VS:  LMP 05/07/2002 (Approximate)    Wt Readings from Last 3 Encounters:  02/18/23 177 lb 4.8 oz (80.4 kg)  11/06/22 172 lb 14.4 oz (78.4 kg)  09/20/22 169 lb 12.8 oz (77 kg)    GEN: Well nourished, well developed in no acute distress NECK: No JVD; No carotid bruits CARDIAC: ***RRR, no murmurs, rubs, gallops RESPIRATORY:  Clear to auscultation without rales, wheezing or rhonchi  ABDOMEN:  Soft, non-tender, non-distended EXTREMITIES:  No edema; No deformity   ASSESSMENT AND PLAN: .   ***    {Are you ordering a CV Procedure (e.g. stress test, cath, DCCV, TEE, etc)?   Press F2        :[id number]}    Signed, Bettey Mare. Liborio Nixon, ANP, AACC

## 2023-06-18 ENCOUNTER — Ambulatory Visit: Payer: Medicare HMO | Admitting: Adult Health

## 2023-07-05 ENCOUNTER — Ambulatory Visit
Admission: RE | Admit: 2023-07-05 | Discharge: 2023-07-05 | Disposition: A | Discharge status: Home / Self Care | Payer: Medicare HMO | Source: Ambulatory Visit | Attending: Adult Health | Admitting: Adult Health

## 2023-07-05 DIAGNOSIS — D0511 Intraductal carcinoma in situ of right breast: Secondary | ICD-10-CM

## 2023-07-05 HISTORY — DX: Personal history of irradiation: Z92.3

## 2023-08-19 ENCOUNTER — Inpatient Hospital Stay: Payer: Medicare HMO | Attending: Hematology and Oncology | Admitting: Hematology and Oncology

## 2023-08-19 VITALS — BP 136/64 | HR 89 | Temp 97.7°F | Resp 18 | Ht 65.0 in

## 2023-08-19 DIAGNOSIS — Z79811 Long term (current) use of aromatase inhibitors: Secondary | ICD-10-CM | POA: Diagnosis not present

## 2023-08-19 DIAGNOSIS — M8580 Other specified disorders of bone density and structure, unspecified site: Secondary | ICD-10-CM | POA: Insufficient documentation

## 2023-08-19 DIAGNOSIS — D0511 Intraductal carcinoma in situ of right breast: Secondary | ICD-10-CM

## 2023-08-19 NOTE — Progress Notes (Signed)
 BRIEF ONCOLOGIC HISTORY:  Oncology History  Ductal carcinoma in situ (DCIS) of right breast  07/03/2022 Mammogram   Bilateral screening mammogram showed further evaluation suggested for calcifications in the right breast.  Right breast diagnostic mammogram showed pleomorphic calcifications in the upper breast at posterior depth spanning about 1.9 x 2 x 1.1 cm which are linear in morphology.   07/18/2022 Pathology Results   Right breast needle core biopsy upper outer quadrant showed DCIS comedo, high nuclear grade, necrosis present, calcs present prognostic showed ER 95% positive strong staining PR 95% positive strong staining   08/29/2022 Surgery   High grade DCIS, 0.8cm   08/29/2022 Cancer Staging   Staging form: Breast, AJCC 8th Edition - Pathologic stage from 08/29/2022: Stage 0 (pTis (DCIS), pN0, cM0) - Signed by Loa Socks, NP on 02/18/2023   10/04/2022 - 10/31/2022 Radiation Therapy   Plan Name: Breast_R Site: Breast, Right Technique: 3D Mode: Photon Dose Per Fraction: 2.66 Gy Prescribed Dose (Delivered / Prescribed): 42.56 Gy / 42.56 Gy Prescribed Fxs (Delivered / Prescribed): 16 / 16   Plan Name: Breast_R_Bst Site: Breast, Right Technique: 3D Mode: Photon Dose Per Fraction: 2 Gy Prescribed Dose (Delivered / Prescribed): 8 Gy / 8 Gy Prescribed Fxs (Delivered / Prescribed): 4 / 4   11/2022 -  Anti-estrogen oral therapy   Anastrozole daily     INTERVAL HISTORY:  Discussed the use of AI scribe software for clinical note transcription with the patient, who gave verbal consent to proceed.  History of Present Illness Lorraine Kemp is a 73 year old female with breast cancer on anastrozole who presents for follow-up.  She is concerned about weight gain, although her weight remains stable at 177 pounds, the same as her last visit in October 2024. She attributes some of her perceived weight gain to decreased physical activity during the winter months and plans to  resume walking as the weather improves.  She is currently taking anastrozole and has experienced significant improvement in hot flashes. She also takes a vitamin D supplement to support bone health. She attends yoga classes a couple of times a week and intends to increase her walking routine to help with bone density.  Her mammogram results are satisfactory, although she has been informed of having dense breast tissue. She has not experienced any significant issues with sleep and reports sleeping well.  She has not seen her breast surgeon since her last surgery in 2024. She is due for a bone density test this year, as her last one was in 2023.  REVIEW OF SYSTEMS:  Review of Systems  Constitutional:  Positive for fatigue. Negative for appetite change, chills, fever and unexpected weight change.  HENT:   Negative for hearing loss, lump/mass and trouble swallowing.   Eyes:  Negative for eye problems and icterus.  Respiratory:  Negative for chest tightness, cough and shortness of breath.   Cardiovascular:  Negative for chest pain, leg swelling and palpitations.  Gastrointestinal:  Negative for abdominal distention, abdominal pain, constipation, diarrhea, nausea and vomiting.  Endocrine: Positive for hot flashes.  Genitourinary:  Negative for difficulty urinating.   Musculoskeletal:  Negative for arthralgias.  Skin:  Negative for itching and rash.  Neurological:  Negative for dizziness, extremity weakness, headaches and numbness.  Hematological:  Negative for adenopathy. Does not bruise/bleed easily.  Psychiatric/Behavioral:  Negative for depression. The patient is not nervous/anxious.   Breast: Denies any new nodularity, masses, tenderness, nipple changes, or nipple discharge.    PAST MEDICAL/SURGICAL HISTORY:  Past Medical History:  Diagnosis Date   Atrial fibrillation (HCC)    Cancer (HCC) 2024   Breast CA   Chronic diastolic congestive heart failure (HCC)    Complication of anesthesia     Depression    Dysrhythmia    A. Fib   Hematuria    workup by Dr. Retta Diones   Hypothyroidism    Thyroid removed   Longstanding persistent atrial fibrillation (HCC)    MITRAL REGURGITATION    Mitral stenosis    Personal history of radiation therapy    Pneumonia    Hx   PONV (postoperative nausea and vomiting)    RHEUMATIC HEART DISEASE    S/P Minimally invasive maze operation for atrial fibrillation 03/28/2018   Complete bilateral atrial lesion set using crythermy and bipolar radiofrequency ablation with clipping of LA appendage via right mini thoracotomy   S/P minimally invasive mitral valve replacement with bioprosthetic valve 03/28/2018   31 mm [facility] Mitral stented bovine pericardial tissue valve    Thyroid nodule    Wrist fracture, left 2019   Dr. Lajoyce Corners   Past Surgical History:  Procedure Laterality Date   ABDOMINAL AORTOGRAM N/A 03/04/2018   Procedure: ABDOMINAL AORTOGRAM;  Surgeon: Corky Crafts, MD;  Location: [facility] INVASIVE CV LAB;  Service: Cardiovascular;  Laterality: N/A;   BREAST BIOPSY Right 07/18/2022   MM RT BREAST BX W LOC DEV 1ST LESION IMAGE BX SPEC STEREO GUIDE 07/18/2022 [facility] MAMMOGRAPHY   BREAST BIOPSY  08/28/2022   MM RT RADIOACTIVE SEED LOC MAMMO GUIDE 08/28/2022 [facility] MAMMOGRAPHY   BREAST LUMPECTOMY WITH RADIOACTIVE SEED LOCALIZATION Right 08/29/2022   Procedure: RIGHT BREAST LUMPECTOMY WITH RADIOACTIVE SEED LOCALIZATION;  Surgeon: Griselda Miner, MD;  Location: [facility] OR;  Service: General;  Laterality: Right;   CARDIOVERSION     2000   CATARACT EXTRACTION Bilateral    CESAREAN SECTION     COLONOSCOPY     FRACTURE SURGERY Right    Ankle with two screws   MINIMALLY INVASIVE MAZE PROCEDURE N/A 03/28/2018   Procedure: MINIMALLY INVASIVE MAZE PROCEDURE;  Surgeon: Purcell Nails, MD;  Location: [facility] OR;  Service: Open Heart Surgery;  Laterality: N/A;   MITRAL VALVE REPLACEMENT Right 03/28/2018   Procedure: MINIMALLY INVASIVE MITRAL VALVE (MV)  REPLACEMENT. Magna Mitral Ease 31mm.;  Surgeon: Purcell Nails, MD;  Location: [facility] OR;  Service: Open Heart Surgery;  Laterality: Right;  glutaraldehyde   PERCUTANEOUS BALLOON VALVULOPLASTY  10/06/1998   [facility]   RIGHT/LEFT HEART CATH AND CORONARY ANGIOGRAPHY N/A 03/04/2018   Procedure: RIGHT/LEFT HEART CATH AND CORONARY ANGIOGRAPHY;  Surgeon: Corky Crafts, MD;  Location: [facility] INVASIVE CV LAB;  Service: Cardiovascular;  Laterality: N/A;   screw in ankles  05/08/1987   right   TEE WITHOUT CARDIOVERSION N/A 12/31/2017   Procedure: TRANSESOPHAGEAL ECHOCARDIOGRAM (TEE);  Surgeon: Lewayne Bunting, MD;  Location: [facility] ENDOSCOPY;  Service: Cardiovascular;  Laterality: N/A;   TEE WITHOUT CARDIOVERSION N/A 03/28/2018   Procedure: TRANSESOPHAGEAL ECHOCARDIOGRAM (TEE);  Surgeon: Purcell Nails, MD;  Location: [facility] OR;  Service: Open Heart Surgery;  Laterality: N/A;   THYROIDECTOMY  05/30/2012   Procedure: THYROIDECTOMY;  Surgeon: Velora Heckler, MD;  Location: [facility] ORS;  Service: General;  Laterality: N/A;  total thyroidectomy   TONSILLECTOMY  05/07/1954   TUBAL LIGATION       ALLERGIES:  Allergies  Allergen Reactions   Metoprolol Succinate [Metoprolol] Other (See Comments)     Fatigue , and sleep disturbances  CURRENT MEDICATIONS:  Outpatient Encounter Medications as of 08/19/2023  Medication Sig   acetaminophen (TYLENOL) 500 MG tablet Take 500-1,000 mg by mouth every 6 (six) hours as needed (pain.). (Patient not taking: Reported on 02/18/2023)   amoxicillin (AMOXIL) 500 MG capsule Take 2,000 mg by mouth See admin instructions. Take 4 capsules (2000 mg) by mouth 1 hour prior to dental appointment (Patient not taking: Reported on 02/18/2023)   anastrozole (ARIMIDEX) 1 MG tablet Take 1 tablet (1 mg total) by mouth daily.   Calcium Carb-Cholecalciferol (CALCIUM 600+D3 PO) Take 1 tablet by mouth in the morning.   diltiazem (CARDIZEM CD) 120 MG 24 hr capsule TAKE 1 CAPSULE BY MOUTH  EVERY DAY   DULoxetine (CYMBALTA) 30 MG capsule Take 30 mg by mouth in the morning.   ELIQUIS 5 MG TABS tablet TAKE 1 TABLET BY MOUTH TWICE A DAY   levothyroxine (SYNTHROID) 88 MCG tablet Take 88 mcg by mouth daily.   Multiple Minerals-Vitamins (CALCIUM-MAGNESIUM-ZINC-D3 PO) Take 1 tablet by mouth daily.   Multiple Vitamin (MULTIVITAMIN WITH MINERALS) TABS tablet Take 1 tablet by mouth in the morning.   No facility-administered encounter medications on file as of 08/19/2023.     ONCOLOGIC FAMILY HISTORY:  Family History  Problem Relation Age of Onset   Heart disease Mother        S/P CABG   Lung cancer Father 62       smoked   Hypertension Sister    Lung cancer Sister 54       smoked   Brain cancer Brother 54   Heart disease Maternal Grandmother    Breast cancer Cousin 58       double first cousin     SOCIAL HISTORY:  Social History   Socioeconomic History   Marital status: Married    Spouse name: Not on file   Number of children: 2   Years of education: Not on file   Highest education level: Not on file  Occupational History   Occupation: branch office administrator    Employer: [employer]  Tobacco Use   Smoking status: Never   Smokeless tobacco: Never  Vaping Use   Vaping status: Never Used  Substance and Sexual Activity   Alcohol use: Yes    Alcohol/week: 1.0 standard drink of alcohol    Types: 1 Glasses of wine per week    Comment: occas glass of wine weekly   Drug use: No   Sexual activity: Yes    Partners: Male    Birth control/protection: Surgical, Post-menopausal    Comment: BTL  Other Topics Concern   Not on file  Social History Narrative   Not on file   Social Drivers of Health   Financial Resource Strain: Low Risk  (06/02/2023)   Received from [facility]   Overall Financial Resource Strain (CARDIA)    Difficulty of Paying Living Expenses: Not hard at all  Food Insecurity: No Food Insecurity (06/02/2023)   [facility] from [facility]    Hunger Vital Sign    Worried About Running Out of Food in the Last Year: Never true    Ran Out of Food in the Last Year: Never true  Transportation Needs: No Transportation Needs (06/02/2023)   Received from [facility] - Transportation    Lack of Transportation (Medical): No    Lack of Transportation (Non-Medical): No  Physical Activity: Unknown (06/02/2023)   Received from [facility]   Exercise Vital Sign  Days of Exercise per Week: 0 days    Minutes of Exercise per Session: Not on file  Stress: No Stress Concern Present (06/02/2023)   Received from [facility] - Occupational Stress Questionnaire    Feeling of Stress : Only a little  Social Connections: Socially Integrated (06/02/2023)   Received from [facility]   Social Network    How would you rate your social network (family, work, friends)?: Good participation with social networks  Intimate Partner Violence: Not At Risk (06/02/2023)   Received from [facility]   HITS    Over the last 12 months how often did your partner physically hurt you?: Never    Over the last 12 months how often did your partner insult you or talk down to you?: Never    Over the last 12 months how often did your partner threaten you with physical harm?: Never    Over the last 12 months how often did your partner scream or curse at you?: Never     OBSERVATIONS/OBJECTIVE:  BP 136/64 (BP Location: Left Arm, Patient Position: Sitting)   Pulse 89   Temp 97.7 F (36.5 C) (Tympanic)   Resp 18   Ht 5\' 5"  (1.651 m)   LMP 05/07/2002 (Approximate)   SpO2 99%   BMI 29.50 kg/m  GENERAL: Patient is a well appearing female in no acute distress HEENT:  Sclerae anicteric.  Oropharynx clear and moist. No ulcerations or evidence of oropharyngeal candidiasis. Neck is supple.  NODES:  No cervical, supraclavicular, or axillary lymphadenopathy palpated.  BREAST EXAM: Right breast status postlumpectomy and  radiation no sign of local recurrence left breast is benign. LUNGS:  Clear to auscultation bilaterally.  No wheezes or rhonchi. HEART:  Regular rate and rhythm. No murmur appreciated. ABDOMEN:  Soft, nontender.  Positive, normoactive bowel sounds. No organomegaly palpated. MSK:  No focal spinal tenderness to palpation. Full range of motion bilaterally in the upper extremities. EXTREMITIES:  No peripheral edema.   SKIN:  Clear with no obvious rashes or skin changes. No nail dyscrasia. NEURO:  Nonfocal. Well oriented.  Appropriate affect.   LABORATORY DATA:  None for this visit.  DIAGNOSTIC IMAGING:  None for this visit.      ASSESSMENT AND PLAN:   Ms.. Facer is a pleasant 73 y.o. female with Stage 0 right breast DCIS, ER+/PR+, diagnosed in 06/2022, treated with lumpectomy, adjuvant radiation therapy, and anti-estrogen therapy with Anastrozole beginning in 11/2022.  Assessment and Plan Assessment & Plan Breast cancer  Undergoing anastrozole treatment. Mammogram shows dense breast tissue, no masses. Small seroma at surgical site, expected to resolve. Reports bloating, no weight gain. Hot flashes improved, adequate sleep. Annual diagnostic mammograms planned for five years. - Order annual diagnostic mammograms for five years, next due after July 04, 2024. - Monitor seroma at the surgical site, no intervention needed. - Continue anastrozole therapy.  Osteopenia Osteopenia present. Anastrozole does not aid bone density. Takes vitamin D, does yoga and walking. Prefers to avoid bisphosphonates. Walking encouraged for bone density improvement. - Encourage regular walking to improve bone density. - Continue vitamin D supplementation. - Order bone density scan for 2025.  Follow-up Follow-up care coordinated with oncology team. No need for breast surgeon unless necessary. Mammogram and bone density scan scheduled. - Schedule follow-up appointment in six months. - Coordinate mammogram  order for March 2026. - Arrange bone density scan for 2025.   Total encounter time:30 minutes*in face-to-face visit time, chart  review, lab review, care coordination, order entry, and documentation of the encounter time.  *Total Encounter Time as defined by the Centers for Medicare and Medicaid Services includes, in addition to the face-to-face time of a patient visit (documented in the note above) non-face-to-face time: obtaining and reviewing outside history, ordering and reviewing medications, tests or procedures, care coordination (communications with other health care professionals or caregivers) and documentation in the medical record.

## 2023-08-20 ENCOUNTER — Telehealth: Payer: Self-pay | Admitting: Hematology and Oncology

## 2023-08-20 NOTE — Telephone Encounter (Signed)
 Spoke with patient confirming upcoming appointment

## 2023-08-25 NOTE — Progress Notes (Signed)
 Cardiology Office Note:    Date:  09/03/2023   ID:  Lorraine Kemp, Lorraine Kemp [birth year], MRN [id number]  PCP:  Emaline Handsome, MD  Cardiologist:  Alexandria Angel, MD     Referring MD: Emaline Handsome, MD   Chief Complaint: routine follow-up of CAD, atrial fibrillation, and rheumatic heart disease  History of Present Illness:    Lorraine Kemp is a 73 y.o. female with a history of mild non-obstructive CAD noted on cardiac catheterization in 02/2018, persistent atrial fibrillation on Eliquis , rheumatic heart disease with mitral stenosis s/p mitral valve repair in 10/1998 at [facility] and then minimally invasive mitral valve replacement in 03/2018, mild carotid stenosis, hyperlipidemia, hypothyroidism, and breast cancer who is followed by Dr. Audery Blazing and presents today for routine follow-up.   Patient has a history of rheumatic heart disease with prior mitral valve repair with balloon angioplasty in [facility] in 10/1998. She also has a history of persistent atrial fibrillation. He was previously on Coumadin  but is now on Eliquis . TEE in 12/2017 showed normal LV function with severe mitral stenosis and mild to moderate mitral regurgitation. She ultimately underwent a minimally invasive mitral valve replacement with an Edwards magna mitral stented bovine pericardial tissue valve and minimally invasive MAZE procedure in 03/2018. Cardiac catheterization prior to valve surgery in 02/2018 showed only mild non-obstructive CAD. Last Echo in 08/2021 showed LVEF of 55-60% with normal wall motion, moderately enlarged RV with normal RV function, severe biatrial enlargement, and normal structure and function of bioprosthetic mitral valve (mean gradient 3.0 mmHg). Of note, patient has chronic diastolic CHF listed under past medical history; however, I do not see this mentioned specifically in prior Cardiology notes.   She was last seen by Dr. Audery Blazing in 06/2022 at which time she reported dyspnea with more extreme activities but  not with routine activities and was otherwise doing well from a cardiac standpoint.  Patient presents today for follow-up.  Here with her husband.  She reports some increased dyspnea on exertion since last visit especially when she was working outside.  She is wondering if this could be due to some weight gain. She is up 12 lbs from visit last year. She does states she had significant dyspnea and fatigue prior to her most recent valve surgery but states it was much worse than her current symptoms.  She denies any dyspnea at rest.  No orthopnea, PND, lower extremity edema.  No chest pain, palpitations, lightheadedness, dizziness, syncope.  EKG today does show atrial fibrillation with a rate of 84 bpm.  She states she goes in and out of atrial fibrillation based on her Apple Watch.  She is asymptomatic with this.  EKGs/Labs/Other Studies Reviewed:    The following studies were reviewed:  Right/ Left Cardiac Catheterization 03/04/2018: Mid LAD lesion is 25% stenosed. Prox LAD lesion is 25% stenosed. LV end diastolic pressure is normal. There is no aortic valve stenosis. Nonobstructive coronary artery disease. CO 5.37 L/min; CI 2.94; Ao sat 98%, PA sat 73%; PA pressure 33/15, mean PA 22 mm Hg; mean PCWP 16 mm Hg No significant aortoiliac disease noted on abdominal aortogram. Mitral valve area calculated: 1.76 cm2   Continue with plan for mitral valve surgery.  Diagnostic Dominance: Right  _______________  Carotid Dopplers 03/25/2018: Summary: Right Carotid: Velocities in the right ICA are consistent with a 1-39%  stenosis.   Left Carotid: Velocities in the left ICA are consistent with a 1-39%  stenosis.  Vertebrals: Bilateral vertebral arteries demonstrate  antegrade flow.   _______________  Echocardiogram 08/10/2021: Impressions: 1. Left ventricular ejection fraction, by estimation, is 55 to 60%. The  left ventricle has normal function. The left ventricle has no regional  wall  motion abnormalities. Left ventricular diastolic function could not  be evaluated.   2. Right ventricular systolic function is normal. The right ventricular  size is moderately enlarged. There is normal pulmonary artery systolic  pressure. The estimated right ventricular systolic pressure is 33.0 mmHg.   3. Left atrial size was severely dilated.   4. Right atrial size was severely dilated.   5. The mitral valve has been repaired/replaced. No evidence of mitral  valve regurgitation. The mean mitral valve gradient is 3.0 mmHg with  average heart rate of 78 bpm. There is a 31 mm present in the mitral  position. Echo findings are consistent with  normal structure and function of the mitral valve prosthesis.   6. The aortic valve is tricuspid. Aortic valve regurgitation is not  visualized. No aortic stenosis is present.   7. The inferior vena cava is dilated in size with >50% respiratory  variability, suggesting right atrial pressure of 8 mmHg.   8. Rhythm strip during this exam demostrated atrial fibrillation.    EKG:  EKG ordered today.   EKG Interpretation Date/Time:  Tuesday September 03 2023 11:26:50 EDT Ventricular Rate:  84 PR Interval:    QRS Duration:  92 QT Interval:  386 QTC Calculation: 456 R Axis:   11  Text Interpretation: Atrial fibrillation with PVC Septal infarct (cited on or before 01-Apr-2018) No acute ST/ T wave changes Confirmed by Jamaul Heist [phone]) on 09/03/2023 11:33:59 AM     Recent Labs: No results found for requested labs within last 365 days.  Recent Lipid Panel    Component Value Date/Time   CHOL 178 04/06/2014 1132   TRIG 60 04/06/2014 1132   HDL 59 04/06/2014 1132   CHOLHDL 3.0 04/06/2014 1132   VLDL 12 04/06/2014 1132   LDLCALC 107 (H) 04/06/2014 1132    Physical Exam:    Vital Signs: BP 130/80   Pulse 99   Ht 5\' 5"  (1.651 m)   Wt 176 lb (79.8 kg)   LMP 05/07/2002 (Approximate)   SpO2 93%   BMI 29.29 kg/m     Wt Readings from Last  3 Encounters:  09/03/23 176 lb (79.8 kg)  02/18/23 177 lb 4.8 oz (80.4 kg)  11/06/22 172 lb 14.4 oz (78.4 kg)     General: 73 y.o. female in no acute distress. HEENT: Normocephalic and atraumatic.  Neck: Supple. No carotid bruits. No JVD. Heart: Irregular rhythm with normal rate. No murmurs, gallops, or rubs.  Lungs: No increased work of breathing. Clear to ausculation bilaterally. No wheezes, rhonchi, or rales.  Extremities: No lower extremity edema.  Skin: Warm and dry. Neuro: No focal deficits. Psych: Normal affect. Responds appropriately.   Assessment:    1. DOE (dyspnea on exertion)   2. Non-obstructive CAD   3. Persistent atrial fibrillation (HCC)   4. Rheumatic heart disease   5. Severe mitral valve stenosis s/p MVR   6. Hyperlipidemia, unspecified hyperlipidemia type     Plan:    Dyspnea on Exertion  Patient reports worsening dyspnea on exertion since last visit. No dyspnea at rest or chest pain. - Euvolemic on exam. No No other signs or symptoms of CHF. - Possibly due to weight gain and deconditioning. However, will order Echo to reassess LV function and mitral valve. -  Recommended increasing physical activity as tolerated.   Mild Non-Obstructive CAD LHC in 02/2018 showed only 25% of proximal LAD and mid LAD but no obstructive CAD. - No chest pain.  - Not on anticoagulation due to need for full anticoagulation.  - Will start Crestor 20mg  daily.   Paroxysmal Atrial Fibrillation S/p Maze Procedure in 03/2018 EKG today shows she is in rate controlled atrial fibrillation (rate 84 bpm).  - Asymptomatic with this.  - Continue Cardizem  CD 120mg  daily.  - Continue Eliquis  5mg  twice daily. - States she goes in and out of atrial fibrillation but is asymptomatic with it. Offered Zio monitor to help assess atrial fibrillation burden but she states she does not want to be on any other medications (like an antiarrhythmic) if possible. So will hold off on this for now since  it likely won't change management.    Rheumatic Valve Disease Severe Mitral Stenosis s/p MVR Patient has a history of rheumatic heart disease with severe mitral stenosis. He underwent remote mitral valve repair with balloon angioplasty in 10/1998 at [facility]. He then had recurrent severe mitral stenosis and underwent minimally invasive mitral valve replacement with an Edwards magna mitral stented bovine pericardial tissue valve in 03/2018. Last Echo in 08/2021 showed normal structure and function of bioprosthetic mitral valve.  - Will order Echo for routine monitoring.  - Continue SBE prophylaxis with Amoxicillin prior to dental procedures.   Hyperlipidemia She has a history of hyperlipidemia but is not currently on any medications for this. Lipid panel in 05/2023 at PCP's office showed: Total Cholesterol 198, Triglycerides 86, HDL 67, LDL 116. LDL goal <70 given CAD. - Will start Crestor 20mg  daily. - Will repeat lipid panel and LFTs in 2-3 months.    Disposition: Follow up in 6 months.    Sable Cram, PA-C  09/03/2023 12:23 PM    [facility]

## 2023-09-03 ENCOUNTER — Ambulatory Visit: Attending: Student | Admitting: Student

## 2023-09-03 ENCOUNTER — Encounter: Payer: Self-pay | Admitting: Student

## 2023-09-03 VITALS — BP 130/80 | HR 99 | Ht 65.0 in | Wt 176.0 lb

## 2023-09-03 DIAGNOSIS — I2510 Atherosclerotic heart disease of native coronary artery without angina pectoris: Secondary | ICD-10-CM

## 2023-09-03 DIAGNOSIS — I4819 Other persistent atrial fibrillation: Secondary | ICD-10-CM

## 2023-09-03 DIAGNOSIS — I050 Rheumatic mitral stenosis: Secondary | ICD-10-CM

## 2023-09-03 DIAGNOSIS — I099 Rheumatic heart disease, unspecified: Secondary | ICD-10-CM | POA: Diagnosis not present

## 2023-09-03 DIAGNOSIS — R0609 Other forms of dyspnea: Secondary | ICD-10-CM

## 2023-09-03 DIAGNOSIS — E785 Hyperlipidemia, unspecified: Secondary | ICD-10-CM

## 2023-09-03 MED ORDER — ROSUVASTATIN CALCIUM 20 MG PO TABS: 20.0000 mg | ORAL_TABLET | Freq: Every day | ORAL | 6 refills | Status: DC

## 2023-09-03 NOTE — Patient Instructions (Signed)
 Medication Instructions:  START CRESTOR 20MG  *If you need a refill on your cardiac medications before your next appointment, please call your pharmacy*  Lab Work: FASTING LIPID AND LFT IN 2-3 MONTHS If you have labs (blood work) drawn today and your tests are completely normal, you will receive your results only by:  MyChart Message (if you have MyChart) OR A paper copy in the mail If you have any lab test that is abnormal or we need to change your treatment, we will call you to review the results.  Testing/Procedures: Your physician has requested that you have an echocardiogram. Echocardiography is a painless test that uses sound waves to create images of your heart. It provides your doctor with information about the size and shape of your heart and how well your heart's chambers and valves are working. This procedure takes approximately one hour. There are no restrictions for this procedure. Please do NOT wear cologne, perfume, aftershave, or lotions (deodorant is allowed). Please arrive 15 minutes prior to your appointment time.  Please note: We ask at that you not bring children with you during ultrasound (echo/ vascular) testing. Due to room size and safety concerns, children are not allowed in the ultrasound rooms during exams. Our front office staff cannot provide observation of children in our lobby area while testing is being conducted. An adult accompanying a patient to their appointment will only be allowed in the ultrasound room at the discretion of the ultrasound technician under special circumstances. We apologize for any inconvenience.   Follow-Up: At [facility], you and your health needs are our priority.  As part of our continuing mission to provide you with exceptional heart care, our providers are all part of one team.  This team includes your primary Cardiologist (physician) and Advanced Practice Providers or APPs (Physician Assistants and Nurse Practitioners) who all  work together to provide you with the care you need, when you need it.  Your next appointment:   6 month(s)  Provider:   Alexandria Angel, MD or Callie Goodrich, PA-C

## 2023-10-03 ENCOUNTER — Ambulatory Visit (HOSPITAL_COMMUNITY)
Admission: RE | Admit: 2023-10-03 | Discharge: 2023-10-03 | Disposition: A | Discharge status: Home / Self Care | Source: Ambulatory Visit | Attending: Cardiology | Admitting: Cardiology

## 2023-10-03 DIAGNOSIS — R0609 Other forms of dyspnea: Secondary | ICD-10-CM | POA: Diagnosis present

## 2023-10-03 LAB — ECHOCARDIOGRAM COMPLETE
Area-P 1/2: 3.08 cm2
MV VTI: 1.49 cm2
P 1/2 time: 431 ms
S' Lateral: 2.5 cm

## 2023-10-04 ENCOUNTER — Ambulatory Visit: Payer: Self-pay | Admitting: Student

## 2023-10-04 DIAGNOSIS — Z79899 Other long term (current) drug therapy: Secondary | ICD-10-CM

## 2023-10-04 DIAGNOSIS — E785 Hyperlipidemia, unspecified: Secondary | ICD-10-CM

## 2023-10-26 ENCOUNTER — Other Ambulatory Visit: Payer: Self-pay | Admitting: Cardiology

## 2023-10-26 ENCOUNTER — Other Ambulatory Visit: Payer: Self-pay | Admitting: Hematology and Oncology

## 2023-10-26 DIAGNOSIS — I4892 Unspecified atrial flutter: Secondary | ICD-10-CM

## 2023-10-28 NOTE — Telephone Encounter (Signed)
 Eliquis  5mg  refill request received. Patient is 73 years old, weight-79.8kg, Crea-0.83 on 06/05/23 via scanned labs from [place], [place], and last seen by Callie Goodrich on 09/03/23. Dose is appropriate based on dosing criteria. Will send in refill to requested pharmacy.

## 2023-11-27 ENCOUNTER — Other Ambulatory Visit: Payer: Self-pay | Admitting: Cardiology

## 2023-12-04 LAB — HEPATIC FUNCTION PANEL
ALT: 10 IU/L (ref 0–32)
AST: 23 IU/L (ref 0–40)
Albumin: 4.5 g/dL (ref 3.8–4.8)
Alkaline Phosphatase: 83 IU/L (ref 44–121)
Bilirubin Total: 0.5 mg/dL (ref 0.0–1.2)
Bilirubin, Direct: 0.2 mg/dL (ref 0.00–0.40)
Total Protein: 7 g/dL (ref 6.0–8.5)

## 2023-12-04 LAB — LIPID PANEL
Chol/HDL Ratio: 2.2 ratio (ref 0.0–4.4)
Cholesterol, Total: 149 mg/dL (ref 100–199)
HDL: 69 mg/dL (ref >39)
LDL Chol Calc (NIH): 63 mg/dL (ref 0–99)
Triglycerides: 95 mg/dL (ref 0–149)
VLDL Cholesterol Cal: 17 mg/dL (ref 5–40)

## 2023-12-05 MED ORDER — ATORVASTATIN CALCIUM 20 MG PO TABS: 20.0000 mg | ORAL_TABLET | Freq: Every day | ORAL | 3 refills | Status: DC

## 2023-12-05 NOTE — Addendum Note (Signed)
 Addended by: VICCI ROXIE CROME on: 12/05/2023 08:23 AM   Modules accepted: Orders

## 2023-12-24 LAB — LAB REPORT - SCANNED: EGFR: 71

## 2023-12-29 NOTE — Addendum Note (Signed)
 Addended by: Abelina Ketron on: 12/29/2023 06:51 PM   Modules accepted: Orders

## 2023-12-29 NOTE — Addendum Note (Signed)
 Addended by: Duncan Alejandro on: 12/29/2023 06:49 PM   Modules accepted: Orders

## 2024-01-03 ENCOUNTER — Telehealth: Payer: Self-pay | Admitting: Cardiology

## 2024-01-03 NOTE — Telephone Encounter (Signed)
 Spoke with Sherri from [employer].  Pt has been in her office twice this week with c/o several episodes of weakness that started in legs then went into her arms.  This was during exercise.  Pt denied any CP/SOB/dizziness-just weakness.  EKG was completed at both office visits, VS stable  BP was a little lower than her normal at 100/62.  Orthostatics were done 122/80 HR 72 laying, 108/70 HR 80 sitting.  Joen would like to know if pt's Diltiazem  dose (currently taking 120 mg daily and is taking Eliquis  5 mg BID) should be decreased and if pt should wear a cardiac monitor.  Pt has HX: of At Fib but can never tell when she is in it. Sherry instructed her to increase fluid intake and to use caution with position changes.  Advised I will forward this information to Dr Pietro for review.  Sherry asked for it to also be sent to the last APP who saw her which was Aline Door, [place] .

## 2024-01-03 NOTE — Telephone Encounter (Signed)
 Joen the PA from [facility] [place] requesting a c/b to provide information about this pt.

## 2024-01-03 NOTE — Telephone Encounter (Signed)
 Left message for patient to call back

## 2024-01-03 NOTE — Telephone Encounter (Signed)
 Attempted to contact Joen B at [facility].  Was being transferred to someone named Tamela and call was disconnected.

## 2024-01-07 ENCOUNTER — Encounter: Payer: Self-pay | Admitting: Cardiology

## 2024-01-08 NOTE — Telephone Encounter (Signed)
 Spoke with pt, Aware of dr vertie recommendations.

## 2024-01-16 NOTE — Progress Notes (Unsigned)
 HPI: FU atrial fibrillation, MS and rheumatic heart disease s/p MVR. She has had prior balloon valvuloplasty at [facility] in June 2000. TEE August 2019 showed normal LV function, biatrial enlargement, mild right ventricular enlargement, severe mitral stenosis with mitral valve area of 1 cm, mild to moderate mitral regurgitation and mild tricuspid regurgitation. Cardiac catheterization October 2019 showed nonobstructive coronary disease. Carotid Dopplers November 2019 showed 1 to 39% bilateral stenosis.  Patient had minimally invasive mitral valve replacement with an Edwards magna mitral stented bovine pericardial tissue valve and minimally invasive maze procedure 11/19. Echocardiogram May 2025 showed normal LV function, severe biatrial enlargement, status post mitral valve replacement with no mitral regurgitation or mitral stenosis, mild to moderate tricuspid regurgitation, trace aortic insufficiency.  Laboratories August 2025 showed normal TSH and hemoglobin.  Recently contacted the office with complaints of jaw and shoulder pain and added to my schedule today.  Since I last saw her, she has had 3 separate episodes of feeling generalized weakness.  2 episodes occurred during an exercise class and another while she was preparing food.  Lasted approximately 15 minutes with no associated symptoms and resolved.  The third episode was followed by pain in her jaws and back.  She denies dyspnea on exertion or exertional chest pain.  She has not had palpitations or syncope.  Current Outpatient Medications  Medication Sig Dispense Refill   acetaminophen  (TYLENOL ) 500 MG tablet Take 500-1,000 mg by mouth every 6 (six) hours as needed (pain.).     amoxicillin (AMOXIL) 500 MG capsule Take 2,000 mg by mouth See admin instructions. Take 4 capsules (2000 mg) by mouth 1 hour prior to dental appointment  2   anastrozole  (ARIMIDEX ) 1 MG tablet TAKE 1 TABLET BY MOUTH EVERY DAY 90 tablet 3   diltiazem  (CARDIZEM   CD) 120 MG 24 hr capsule TAKE 1 CAPSULE BY MOUTH EVERY DAY 90 capsule 2   DULoxetine (CYMBALTA) 30 MG capsule Take 30 mg by mouth in the morning.     ELIQUIS  5 MG TABS tablet TAKE 1 TABLET BY MOUTH TWICE A DAY 60 tablet 5   levothyroxine  (SYNTHROID ) 88 MCG tablet Take 88 mcg by mouth daily.     Multiple Minerals-Vitamins (CALCIUM -MAGNESIUM -ZINC-D3 PO) Take 1 tablet by mouth daily.     Multiple Vitamin (MULTIVITAMIN WITH MINERALS) TABS tablet Take 1 tablet by mouth in the morning.     atorvastatin  (LIPITOR) 20 MG tablet Take 1 tablet (20 mg total) by mouth daily. 90 tablet 3   Calcium  Carb-Cholecalciferol (CALCIUM  600+D3 PO) Take 1 tablet by mouth in the morning. (Patient not taking: Reported on 09/03/2023)     No current facility-administered medications for this visit.     Past Medical History:  Diagnosis Date   Atrial fibrillation (HCC)    Cancer (HCC) 2024   Breast CA   Chronic diastolic congestive heart failure (HCC)    Complication of anesthesia    Depression    Dysrhythmia    A. Fib   Hematuria    workup by Dr. Matilda   Hypothyroidism    Thyroid  removed   Longstanding persistent atrial fibrillation (HCC)    MITRAL REGURGITATION    Mitral stenosis    Personal history of radiation therapy    Pneumonia    Hx   PONV (postoperative nausea and vomiting)    RHEUMATIC HEART DISEASE    S/P Minimally invasive maze operation for atrial fibrillation 03/28/2018   Complete bilateral atrial lesion set using crythermy and  bipolar radiofrequency ablation with clipping of LA appendage via right mini thoracotomy   S/P minimally invasive mitral valve replacement with bioprosthetic valve 03/28/2018   31 mm [facility] Mitral stented bovine pericardial tissue valve    Thyroid  nodule    Wrist fracture, left 2019   Dr. Harden    Past Surgical History:  Procedure Laterality Date   ABDOMINAL AORTOGRAM N/A 03/04/2018   Procedure: ABDOMINAL AORTOGRAM;  Surgeon: Dann Candyce RAMAN, MD;   Location: [facility] INVASIVE CV LAB;  Service: Cardiovascular;  Laterality: N/A;   BREAST BIOPSY Right 07/18/2022   MM RT BREAST BX W LOC DEV 1ST LESION IMAGE BX SPEC STEREO GUIDE 07/18/2022 [facility] MAMMOGRAPHY   BREAST BIOPSY  08/28/2022   MM RT RADIOACTIVE SEED LOC MAMMO GUIDE 08/28/2022 [facility]MO[facility]   BREAST LUMPECTOMY WITH RADIOACTIVE SEED LOCALIZATION Right 08/29/2022   Procedure: RIGHT BREAST LUMPECTOMY WITH RADIOACTIVE SEED LOCALIZATION;  Surgeon: Curvin Deward MOULD, MD;  Location: [facility] OR;  Service: General;  Laterality: Right;   CARDIOVERSION     2000   CATARACT EXTRACTION Bilateral    CESAREAN SECTION     COLONOSCOPY     FRACTURE SURGERY Right    Ankle with two screws   MINIMALLY INVASIVE MAZE PROCEDURE N/A 03/28/2018   Procedure: MINIMALLY INVASIVE MAZE PROCEDURE;  Surgeon: Dusty Sudie DEL, MD;  Location: [facility] OR;  Service: Open Heart Surgery;  Laterality: N/A;   MITRAL VALVE REPLACEMENT Right 03/28/2018   Procedure: MINIMALLY INVASIVE MITRAL VALVE (MV) REPLACEMENT. Magna Mitral Ease 31mm.;  Surgeon: Dusty Sudie DEL, MD;  Location: [facility] OR;  Service: Open Heart Surgery;  Laterality: Right;  glutaraldehyde   PERCUTANEOUS BALLOON VALVULOPLASTY  10/06/1998   [facility]   RIGHT/LEFT HEART CATH AND CORONARY ANGIOGRAPHY N/A 03/04/2018   Procedure: RIGHT/LEFT HEART CATH AND CORONARY ANGIOGRAPHY;  Surgeon: Dann Candyce RAMAN, MD;  Location: [facility] INVASIVE CV LAB;  Service: Cardiovascular;  Laterality: N/A;   screw in ankles  05/08/1987   right   TEE WITHOUT CARDIOVERSION N/A 12/31/2017   Procedure: TRANSESOPHAGEAL ECHOCARDIOGRAM (TEE);  Surgeon: Pietro Redell RAMAN, MD;  Location: [facility] ENDOSCOPY;  Service: Cardiovascular;  Laterality: N/A;   TEE WITHOUT CARDIOVERSION N/A 03/28/2018   Procedure: TRANSESOPHAGEAL ECHOCARDIOGRAM (TEE);  Surgeon: Dusty Sudie DEL, MD;  Location: [facility] OR;  Service: Open Heart Surgery;  Laterality: N/A;   THYROIDECTOMY  05/30/2012   Procedure: THYROIDECTOMY;  Surgeon: Krystal CHRISTELLA Spinner, MD;  Location: [facility] ORS;  Service: General;  Laterality: N/A;  total thyroidectomy   TONSILLECTOMY  05/07/1954   TUBAL LIGATION      Social History   Socioeconomic History   Marital status: Married    Spouse name: Not on file   Number of children: 2   Years of education: Not on file   Highest education level: Not on file  Occupational History   Occupation: branch office administrator    Employer: [employer]  Tobacco Use   Smoking status: Never   Smokeless tobacco: Never  Vaping Use   Vaping status: Never Used  Substance and Sexual Activity   Alcohol use: Yes    Alcohol/week: 1.0 standard drink of alcohol    Types: 1 Glasses of wine per week    Comment: occas glass of wine weekly   Drug use: No   Sexual activity: Yes    Partners: Male    Birth control/protection: Surgical, Post-menopausal    Comment: BTL  Other Topics Concern   Not on file  Social History Narrative  Not on file   Social Drivers of Health   Financial Resource Strain: Low Risk  (06/02/2023)   Received from [facility]   Overall Financial Resource Strain (CARDIA)    Difficulty of Paying Living Expenses: Not hard at all  Food Insecurity: No Food Insecurity (06/02/2023)   Received from [facility]   Hunger Vital Sign    Within the past 12 months, you worried that your food would run out before you got the money to buy more.: Never true    Within the past 12 months, the food you bought just didn't last and you didn't have money to get more.: Never true  Transportation Needs: No Transportation Needs (06/02/2023)   Received from [facility] - Transportation    Lack of Transportation (Medical): No    Lack of Transportation (Non-Medical): No  Physical Activity: Unknown (06/02/2023)   Received [facility]   Exercise Vital Sign    On average, how many days per week do you engage in moderate to strenuous exercise (like a brisk walk)?: 0 days    Minutes of Exercise per Session:  Not on file  Stress: No Stress Concern Present (06/02/2023)   Received from [facility] - Occupational Stress Questionnaire    Feeling of Stress : Only a little  Social Connections: Socially Integrated (06/02/2023)   Received from [facility]   Social Network    How would you rate your social network (family, work, friends)?: Good participation with social networks  Intimate Partner Violence: Not At Risk (06/02/2023)   Received from [facility]   HITS    Over the last 12 months how often did your partner physically hurt you?: Never    Over the last 12 months how often did your partner insult you or talk down to you?: Never    Over the last 12 months how often did your partner threaten you with physical harm?: Never    Over the last 12 months how often did your partner scream or curse at you?: Never    Family History  Problem Relation Age of Onset   Heart disease Mother        S/P CABG   Lung cancer Father 53       smoked   Hypertension Sister    Lung cancer Sister 51       smoked   Brain cancer Brother 65   Heart disease Maternal Grandmother    Breast cancer Cousin 28       double first cousin    ROS: no fevers or chills, productive cough, hemoptysis, dysphasia, odynophagia, melena, hematochezia, dysuria, hematuria, rash, seizure activity, orthopnea, PND, pedal edema, claudication. Remaining systems are negative.  Physical Exam: Well-developed well-nourished in no acute distress.  Skin is warm and dry.  HEENT is normal.  Neck is supple.  Chest is clear to auscultation with normal expansion.  Cardiovascular exam is regular rate and rhythm.  Abdominal exam nontender or distended. No masses palpated. Extremities show no edema. neuro grossly intact  EKG Interpretation Date/Time:  Friday January 17 2024 10:23:48 EDT Ventricular Rate:  80 PR Interval:  364 QRS Duration:  92 QT Interval:  384 QTC Calculation: 442 R  Axis:   25  Text Interpretation: Sinus rhythm with 1st degree A-V block with Premature atrial complexes Septal infarct Confirmed by Pietro Rogue ([id number]) on 01/17/2024 10:25:24 AM    A/P  1 history of mitral valve replacement-most  recent echocardiogram showed normally functioning valve with no mitral stenosis or mitral regurgitation.  Continue SBE prophylaxis.  2 paroxysmal atrial fibrillation-plan to continue Cardizem  and apixaban  at present dose.  Patient is in sinus rhythm today.  3 hyperlipidemia-continue statin.  4 jaw/shoulder pain-etiology unclear.  All 3 episodes occurred with activity.  Will arrange stress PET to further assess.  5 history of mild coronary artery disease-continue statin.  Redell Shallow, MD

## 2024-01-17 ENCOUNTER — Encounter: Payer: Self-pay | Admitting: Cardiology

## 2024-01-17 ENCOUNTER — Ambulatory Visit: Attending: Cardiology | Admitting: Cardiology

## 2024-01-17 VITALS — BP 122/64 | HR 80 | Ht 65.0 in | Wt 178.0 lb

## 2024-01-17 DIAGNOSIS — R072 Precordial pain: Secondary | ICD-10-CM

## 2024-01-17 DIAGNOSIS — E785 Hyperlipidemia, unspecified: Secondary | ICD-10-CM

## 2024-01-17 DIAGNOSIS — I4819 Other persistent atrial fibrillation: Secondary | ICD-10-CM | POA: Diagnosis not present

## 2024-01-17 DIAGNOSIS — I25118 Atherosclerotic heart disease of native coronary artery with other forms of angina pectoris: Secondary | ICD-10-CM

## 2024-01-17 NOTE — Patient Instructions (Signed)
  Testing/Procedures:    Please report to Radiology at the [facility] Main Entrance 30 minutes early for your test.  [address] [place], [place] [address]             How to Prepare for Your Cardiac PET/CT Stress Test:  Nothing to eat or drink, except water, 3 hours prior to arrival time.  NO caffeine/decaffeinated products, or chocolate 12 hours prior to arrival. (Please note decaffeinated beverages (teas/coffees) still contain caffeine).  If you have caffeine within 12 hours prior, the test will need to be rescheduled.  Medication instructions: Take medications with water.  NO perfume, cologne or lotion on chest or abdomen area. FEMALES - Please avoid wearing dresses to this appointment.  Total time is 1 to 2 hours; you may want to bring reading material for the waiting time.   In preparation for your appointment, medication and supplies will be purchased.  Appointment availability is limited, so if you need to cancel or reschedule, please call the Radiology Department Scheduler at [phone] 24 hours in advance to avoid a cancellation fee of $100.00  What to Expect When you Arrive:  Once you arrive and check in for your appointment, you will be taken to a preparation room within the Radiology Department.  A technologist or Nurse will obtain your medical history, verify that you are correctly prepped for the exam, and explain the procedure.  Afterwards, an IV will be started in your arm and electrodes will be placed on your skin for EKG monitoring during the stress portion of the exam. Then you will be escorted to the PET/CT scanner.  There, staff will get you positioned on the scanner and obtain a blood pressure and EKG.  During the exam, you will continue to be connected to the EKG and blood pressure machines.  A small, safe amount of a radioactive tracer will be injected in your IV to obtain a series of pictures of your heart along with an injection of a stress  agent.    After your Exam:  It is recommended that you eat a meal and drink a caffeinated beverage to counter act any effects of the stress agent.  Drink plenty of fluids for the remainder of the day and urinate frequently for the first couple of hours after the exam.  Your doctor will inform you of your test results within 7-10 business days.  For more information and frequently asked questions, please visit our website: [email or web address]  For questions about your test or how to prepare for your test, please call: Cardiac Imaging Nurse Navigators Office: [phone]   Follow-Up: At [facility], you and your health needs are our priority.  As part of our continuing mission to provide you with exceptional heart care, our providers are all part of one team.  This team includes your primary Cardiologist (physician) and Advanced Practice Providers or APPs (Physician Assistants and Nurse Practitioners) who all work together to provide you with the care you need, when you need it.  Your next appointment:   3 month(s)  Provider:   Redell Shallow, MD

## 2024-02-07 ENCOUNTER — Telehealth: Payer: Self-pay

## 2024-02-07 NOTE — Telephone Encounter (Signed)
 Spoke with patient and confirmed appointment on 10/6

## 2024-02-10 ENCOUNTER — Inpatient Hospital Stay: Attending: Hematology and Oncology | Admitting: Hematology and Oncology

## 2024-02-10 VITALS — BP 149/72 | HR 83 | Temp 97.6°F | Resp 17 | Wt 178.2 lb

## 2024-02-10 DIAGNOSIS — D0511 Intraductal carcinoma in situ of right breast: Secondary | ICD-10-CM | POA: Insufficient documentation

## 2024-02-10 DIAGNOSIS — Z79811 Long term (current) use of aromatase inhibitors: Secondary | ICD-10-CM | POA: Diagnosis not present

## 2024-02-10 NOTE — Progress Notes (Signed)
 BRIEF ONCOLOGIC HISTORY:  Oncology History  Ductal carcinoma in situ (DCIS) of right breast  07/03/2022 Mammogram   Bilateral screening mammogram showed further evaluation suggested for calcifications in the right breast.  Right breast diagnostic mammogram showed pleomorphic calcifications in the upper breast at posterior depth spanning about 1.9 x 2 x 1.1 cm which are linear in morphology.   07/18/2022 Pathology Results   Right breast needle core biopsy upper outer quadrant showed DCIS comedo, high nuclear grade, necrosis present, calcs present prognostic showed ER 95% positive strong staining PR 95% positive strong staining   08/29/2022 Surgery   High grade DCIS, 0.8cm   08/29/2022 Cancer Staging   Staging form: Breast, AJCC 8th Edition - Pathologic stage from 08/29/2022: Stage 0 (pTis (DCIS), pN0, cM0) - Signed by Crawford Morna Pickle, NP on 02/18/2023   10/04/2022 - 10/31/2022 Radiation Therapy   Plan Name: Breast_R Site: Breast, Right Technique: 3D Mode: Photon Dose Per Fraction: 2.66 Gy Prescribed Dose (Delivered / Prescribed): 42.56 Gy / 42.56 Gy Prescribed Fxs (Delivered / Prescribed): 16 / 16   Plan Name: Breast_R_Bst Site: Breast, Right Technique: 3D Mode: Photon Dose Per Fraction: 2 Gy Prescribed Dose (Delivered / Prescribed): 8 Gy / 8 Gy Prescribed Fxs (Delivered / Prescribed): 4 / 4   11/2022 -  Anti-estrogen oral therapy   Anastrozole  daily     INTERVAL HISTORY:  Discussed the use of AI scribe software for clinical note transcription with the patient, who gave verbal consent to proceed.  History of Present Illness   Lorraine Kemp is a 73 year old female with breast cancer who presents with a sore area under her breast. She is accompanied by her husband, [name].  She has experienced a sore area under her breast for the past couple of weeks, which becomes more prominent when lying down at night. She has not sought other medical attention, hoping it would resolve  on its own.  She is currently taking anastrozole  as part of her breast cancer treatment.   She has a history of heart valve replacement in 2019 and experienced weakness during exercise, leading her to consult her cardiologist and temporarily stop her exercise routine. She enjoys walking and participating in a social exercise group with other women, which she plans to resume.  She previously took Lipitor for cholesterol management but discontinued it due to muscle pain and sleep disturbances. She tried a second cholesterol medication but experienced similar adverse effects, leading her to attempt managing her cholesterol through lifestyle changes.    PAST MEDICAL/SURGICAL HISTORY:  Past Medical History:  Diagnosis Date   Atrial fibrillation (HCC)    Cancer (HCC) 2024   Breast CA   Chronic diastolic congestive heart failure (HCC)    Complication of anesthesia    Depression    Dysrhythmia    A. Fib   Hematuria    workup by Dr. Matilda   Hypothyroidism    Thyroid  removed   Longstanding persistent atrial fibrillation (HCC)    MITRAL REGURGITATION    Mitral stenosis    Personal history of radiation therapy    Pneumonia    Hx   PONV (postoperative nausea and vomiting)    RHEUMATIC HEART DISEASE    S/P Minimally invasive maze operation for atrial fibrillation 03/28/2018   Complete bilateral atrial lesion set using crythermy and bipolar radiofrequency ablation with clipping of LA appendage via right mini thoracotomy   S/P minimally invasive mitral valve replacement with bioprosthetic valve 03/28/2018   31 mm  [facility] Mitral stented bovine pericardial tissue valve    Thyroid  nodule    Wrist fracture, left 2019   Dr. Harden   Past Surgical History:  Procedure Laterality Date   ABDOMINAL AORTOGRAM N/A 03/04/2018   Procedure: ABDOMINAL AORTOGRAM;  Surgeon: Dann Candyce RAMAN, MD;  Location: [facility] INVASIVE CV LAB;  Service: Cardiovascular;  Laterality: N/A;   BREAST BIOPSY Right  07/18/2022   MM RT BREAST BX W LOC DEV 1ST LESION IMAGE BX SPEC STEREO GUIDE 07/18/2022 [facility] MAMMOGRAPHY   BREAST BIOPSY  08/28/2022   MM RT RADIOACTIVE SEED LOC MAMMO GUIDE 08/28/2022 [facility] MAMMOGRAPHY   BREAST LUMPECTOMY WITH RADIOACTIVE SEED LOCALIZATION Right 08/29/2022   Procedure: RIGHT BREAST LUMPECTOMY WITH RADIOACTIVE SEED LOCALIZATION;  Surgeon: Curvin Deward MOULD, MD;  Location: [facility] OR;  Service: General;  Laterality: Right;   CARDIOVERSION     2000   CATARACT EXTRACTION Bilateral    CESAREAN SECTION     COLONOSCOPY     FRACTURE SURGERY Right    Ankle with two screws   MINIMALLY INVASIVE MAZE PROCEDURE N/A 03/28/2018   Procedure: MINIMALLY INVASIVE MAZE PROCEDURE;  Surgeon: Dusty Sudie DEL, MD;  Location: [facility] OR;  Service: Open Heart Surgery;  Laterality: N/A;   MITRAL VALVE REPLACEMENT Right 03/28/2018   Procedure: MINIMALLY INVASIVE MITRAL VALVE (MV) REPLACEMENT. Magna Mitral Ease 31mm.;  Surgeon: Dusty Sudie DEL, MD;  Location: [facility] OR;  Service: Open Heart Surgery;  Laterality: Right;  glutaraldehyde   PERCUTANEOUS BALLOON VALVULOPLASTY  10/06/1998   [employer]   RIGHT/LEFT HEART CATH AND CORONARY ANGIOGRAPHY N/A 03/04/2018   Procedure: RIGHT/LEFT HEART CATH AND CORONARY ANGIOGRAPHY;  Surgeon: Dann Candyce RAMAN, MD;  Location: [facility] INVASIVE CV LAB;  Service: Cardiovascular;  Laterality: N/A;   screw in ankles  05/08/1987   right   TEE WITHOUT CARDIOVERSION N/A 12/31/2017   Procedure: TRANSESOPHAGEAL ECHOCARDIOGRAM (TEE);  Surgeon: Pietro Redell RAMAN, MD;  Location[facility] ENDOSCOPY;  Service: Cardiovascular;  Laterality: N/A;   TEE WITHOUT CARDIOVERSION N/A 03/28/2018   Procedure: TRANSESOPHAGEAL ECHOCARDIOGRAM (TEE);  Surgeon: Dusty Sudie DEL, MD;  Location: [facility] OR;  Service: Open Heart Surgery;  Laterality: N/A;   THYROIDECTOMY  05/30/2012   Procedure: THYROIDECTOMY;  Surgeon: Krystal CHRISTELLA Spinner, MD;  Location: [facility] ORS;  Service: General;  Laterality: N/A;  total thyroidectomy    TONSILLECTOMY  05/07/1954   TUBAL LIGATION       ALLERGIES:  Allergies  Allergen Reactions   Metoprolol  Succinate [Metoprolol ] Other (See Comments)     Fatigue , and sleep disturbances     CURRENT MEDICATIONS:  Outpatient Encounter Medications as of 02/10/2024  Medication Sig   acetaminophen  (TYLENOL ) 500 MG tablet Take 500-1,000 mg by mouth every 6 (six) hours as needed (pain.).   amoxicillin (AMOXIL) 500 MG capsule Take 2,000 mg by mouth See admin instructions. Take 4 capsules (2000 mg) by mouth 1 hour prior to dental appointment   anastrozole  (ARIMIDEX ) 1 MG tablet TAKE 1 TABLET BY MOUTH EVERY DAY   atorvastatin  (LIPITOR) 20 MG tablet Take 1 tablet (20 mg total) by mouth daily.   Calcium  Carb-Cholecalciferol (CALCIUM  600+D3 PO) Take 1 tablet by mouth in the morning. (Patient not taking: Reported on 09/03/2023)   diltiazem  (CARDIZEM  CD) 120 MG 24 hr capsule TAKE 1 CAPSULE BY MOUTH EVERY DAY   DULoxetine (CYMBALTA) 30 MG capsule Take 30 mg by mouth in the morning.   ELIQUIS  5 MG TABS tablet TAKE 1 TABLET BY MOUTH TWICE A DAY  levothyroxine  (SYNTHROID ) 88 MCG tablet Take 88 mcg by mouth daily.   Multiple Minerals-Vitamins (CALCIUM -MAGNESIUM -ZINC-D3 PO) Take 1 tablet by mouth daily.   Multiple Vitamin (MULTIVITAMIN WITH MINERALS) TABS tablet Take 1 tablet by mouth in the morning.   No facility-administered encounter medications on file as of 02/10/2024.     ONCOLOGIC FAMILY HISTORY:  Family History  Problem Relation Age of Onset   Heart disease Mother        S/P CABG   Lung cancer Father 25       smoked   Hypertension Sister    Lung cancer Sister 101       smoked   Brain cancer Brother 23   Heart disease Maternal Grandmother    Breast cancer Cousin 39       double first cousin     SOCIAL HISTORY:  Social History   Socioeconomic History   Marital status: Married    Spouse name: Not on file   Number of children: 2   Years of education: Not on file   Highest  education level: Not on file  Occupational History   Occupation: branch office administrator    Employer: [employer]  Tobacco Use   Smoking status: Never   Smokeless tobacco: Never  Vaping Use   Vaping status: Never Used  Substance and Sexual Activity   Alcohol use: Yes    Alcohol/week: 1.0 standard drink of alcohol    Types: 1 Glasses of wine per week    Comment: occas glass of wine weekly   Drug use: No   Sexual activity: Yes    Partners: Male    Birth control/protection: Surgical, Post-menopausal    Comment: BTL  Other Topics Concern   Not on file  Social History Narrative   Not on file   Social Drivers of Health   Financial Resource Strain: Low Risk  (06/02/2023)   Received from [facility]   Overall Financial Resource Strain (CARDIA)    Difficulty of Paying Living Expenses: Not hard at all  Food Insecurity: No Food Insecurity (06/02/2023)   Received from [facility]   Hunger Vital Sign    Within the past 12 months, you worried that your food would run out before you got the money to buy more.: Never true    Within the past 12 months, the food you bought just didn't last and you didn't have money to get more.: Never true  Transportation Needs: No Transportation Needs (06/02/2023)   Received from [facility] - Transportation    Lack of Transportation (Medical): No    Lack of Transportation (Non-Medical): No  Physical Activity: Unknown (06/02/2023)   Received from [facility]   Exercise Vital Sign    On average, how many days per week do you engage in moderate to strenuous exercise (like a brisk walk)?: 0 days    Minutes of Exercise per Session: Not on file  Stress: No Stress Concern Present (06/02/2023)   Received from [facility] - Occupational Stress Questionnaire    Feeling of Stress : Only a little  Social Connections: Socially Integrated (06/02/2023)   Received from [facility]   Social Network     How would you rate your social network (family, work, friends)?: Good participation with social networks  Intimate Partner Violence: Not At Risk (06/02/2023)   Received from [facility]   HITS    Over the last 12 months how often did  your partner physically hurt you?: Never    Over the last 12 months how often did your partner insult you or talk down to you?: Never    Over the last 12 months how often did your partner threaten you with physical harm?: Never    Over the last 12 months how often did your partner scream or curse at you?: Never     OBSERVATIONS/OBJECTIVE:  BP (!) 149/72 (BP Location: Left Arm, Patient Position: Sitting)   Pulse 83   Temp 97.6 F (36.4 C) (Temporal)   Resp 17   Wt 178 lb 3.2 oz (80.8 kg)   LMP 05/07/2002 (Approximate)   SpO2 100%   BMI 29.65 kg/m  GENERAL: Patient is a well appearing female in no acute distress HEENT:  Sclerae anicteric.  Oropharynx clear and moist. No ulcerations or evidence of oropharyngeal candidiasis. Neck is supple.  NODES:  No cervical, supraclavicular, or axillary lymphadenopathy palpated.  BREAST EXAM: Bilateral breasts inspected and palpated. The area of tenderness is below the breast in the intercostal area likely an inflamed area of intercostal muscle. No masses in bilateral breasts. No regional adenopathy. LUNGS:  Clear to auscultation bilaterally.  No wheezes or rhonchi. HEART:  Regular rate and rhythm. No murmur appreciated. ABDOMEN:  Soft, nontender.  Positive, normoactive bowel sounds. No organomegaly palpated. MSK:  No focal spinal tenderness to palpation. Full range of motion bilaterally in the upper extremities. EXTREMITIES:  No peripheral edema.   SKIN:  Clear with no obvious rashes or skin changes. No nail dyscrasia. NEURO:  Nonfocal. Well oriented.  Appropriate affect.   LABORATORY DATA:  None for this visit.  DIAGNOSTIC IMAGING:  None for this visit.      ASSESSMENT AND PLAN:   Ms.. Linville is a pleasant  73 y.o. female with Stage 0 right breast DCIS, ER+/PR+, diagnosed in 06/2022, treated with lumpectomy, adjuvant radiation therapy, and anti-estrogen therapy with Anastrozole  beginning in 11/2022.  Assessment and Plan Assessment & Plan   Breast cancer, status post treatment, on adjuvant anastrozole  Addressed concerns about anastrozole , emphasizing scientific research. - Await results of scheduled mammogram in March. - Provide refill for anastrozole  if requested by pharmacy.  Muscle pain of chest wall Likely intercostal muscle strain from exercise, not related to breast cancer recurrence. - Reassure that the pain is muscular and should resolve on its own.  Hyperlipidemia Previously treated with statins, discontinued due to muscle pain and sleep disturbances.  - Discuss potential new cholesterol medications if lifestyle modifications are insufficient.   Total encounter time:30 minutes*in face-to-face visit time, chart review, lab review, care coordination, order entry, and documentation of the encounter time.  *Total Encounter Time as defined by the Centers for Medicare and Medicaid Services includes, in addition to the face-to-face time of a patient visit (documented in the note above) non-face-to-face time: obtaining and reviewing outside history, ordering and reviewing medications, tests or procedures, care coordination (communications with other health care professionals or caregivers) and documentation in the medical record.

## 2024-02-10 NOTE — Progress Notes (Signed)
 BRIEF ONCOLOGIC HISTORY:  Oncology History  Ductal carcinoma in situ (DCIS) of right breast  07/03/2022 Mammogram   Bilateral screening mammogram showed further evaluation suggested for calcifications in the right breast.  Right breast diagnostic mammogram showed pleomorphic calcifications in the upper breast at posterior depth spanning about 1.9 x 2 x 1.1 cm which are linear in morphology.   07/18/2022 Pathology Results   Right breast needle core biopsy upper outer quadrant showed DCIS comedo, high nuclear grade, necrosis present, calcs present prognostic showed ER 95% positive strong staining PR 95% positive strong staining   08/29/2022 Surgery   High grade DCIS, 0.8cm   08/29/2022 Cancer Staging   Staging form: Breast, AJCC 8th Edition - Pathologic stage from 08/29/2022: Stage 0 (pTis (DCIS), pN0, cM0) - Signed by Crawford Morna Pickle, NP on 02/18/2023   10/04/2022 - 10/31/2022 Radiation Therapy   Plan Name: Breast_R Site: Breast, Right Technique: 3D Mode: Photon Dose Per Fraction: 2.66 Gy Prescribed Dose (Delivered / Prescribed): 42.56 Gy / 42.56 Gy Prescribed Fxs (Delivered / Prescribed): 16 / 16   Plan Name: Breast_R_Bst Site: Breast, Right Technique: 3D Mode: Photon Dose Per Fraction: 2 Gy Prescribed Dose (Delivered / Prescribed): 8 Gy / 8 Gy Prescribed Fxs (Delivered / Prescribed): 4 / 4   11/2022 -  Anti-estrogen oral therapy   Anastrozole  daily     INTERVAL HISTORY:  Discussed the use of AI scribe software for clinical note transcription with the patient, who gave verbal consent to proceed.  History of Present Illness Lorraine Kemp is a 73 year old female with breast cancer on anastrozole  who presents for follow-up.  She is concerned about weight gain, although her weight remains stable at 177 pounds, the same as her last visit in October 2024. She attributes some of her perceived weight gain to decreased physical activity during the winter months and plans to  resume walking as the weather improves.  She is currently taking anastrozole  and has experienced significant improvement in hot flashes. She also takes a vitamin D  supplement to support bone health. She attends yoga classes a couple of times a week and intends to increase her walking routine to help with bone density.  Her mammogram results are satisfactory, although she has been informed of having dense breast tissue. She has not experienced any significant issues with sleep and reports sleeping well.  She has not seen her breast surgeon since her last surgery in 2024. She is due for a bone density test this year, as her last one was in 2023.  REVIEW OF SYSTEMS:  Review of Systems  Constitutional:  Positive for fatigue. Negative for appetite change, chills, fever and unexpected weight change.  HENT:   Negative for hearing loss, lump/mass and trouble swallowing.   Eyes:  Negative for eye problems and icterus.  Respiratory:  Negative for chest tightness, cough and shortness of breath.   Cardiovascular:  Negative for chest pain, leg swelling and palpitations.  Gastrointestinal:  Negative for abdominal distention, abdominal pain, constipation, diarrhea, nausea and vomiting.  Endocrine: Positive for hot flashes.  Genitourinary:  Negative for difficulty urinating.   Musculoskeletal:  Negative for arthralgias.  Skin:  Negative for itching and rash.  Neurological:  Negative for dizziness, extremity weakness, headaches and numbness.  Hematological:  Negative for adenopathy. Does not bruise/bleed easily.  Psychiatric/Behavioral:  Negative for depression. The patient is not nervous/anxious.   Breast: Denies any new nodularity, masses, tenderness, nipple changes, or nipple discharge.    PAST MEDICAL/SURGICAL HISTORY:  Past Medical History:  Diagnosis Date   Atrial fibrillation (HCC)    Cancer (HCC) 2024   Breast CA   Chronic diastolic congestive heart failure (HCC)    Complication of anesthesia     Depression    Dysrhythmia    A. Fib   Hematuria    workup by Dr. Matilda   Hypothyroidism    Thyroid  removed   Longstanding persistent atrial fibrillation (HCC)    MITRAL REGURGITATION    Mitral stenosis    Personal history of radiation therapy    Pneumonia    Hx   PONV (postoperative nausea and vomiting)    RHEUMATIC HEART DISEASE    S/P Minimally invasive maze operation for atrial fibrillation 03/28/2018   Complete bilateral atrial lesion set using crythermy and bipolar radiofrequency ablation with clipping of LA appendage via right mini thoracotomy   S/P minimally invasive mitral valve replacement with bioprosthetic valve 03/28/2018   31 mm [facility] Mitral stented bovine pericardial tissue valve    Thyroid  nodule    Wrist fracture, left 2019   Dr. Harden   Past Surgical History:  Procedure Laterality Date   ABDOMINAL AORTOGRAM N/A 03/04/2018   Procedure: ABDOMINAL AORTOGRAM;  Surgeon: Dann Candyce RAMAN, MD;  Location: [facility] INVASIVE CV LAB;  Service: Cardiovascular;  Laterality: N/A;   BREAST BIOPSY Right 07/18/2022   MM RT BREAST BX W LOC DEV 1ST LESION IMAGE BX SPEC STEREO GUIDE 07/18/2022 [facility] MAMMOGRAPHY   BREAST BIOPSY  08/28/2022   MM RT RADIOACTIVE SEED LOC MAMMO GUIDE 08/28/2022 [facility] MAMMOGRAP[facility]   BREAST LUMPECTOMY WITH RADIOACTIVE SEED LOCALIZATION Right 08/29/2022   Procedure: RIGHT BREAST LUMPECTOMY WITH RADIOACTIVE SEED LOCALIZATION;  Surgeon: Curvin Deward MOULD, MD;  Location: [facility] OR;  Service: General;  Laterality: Right;   CARDIOVERSION     2000   CATARACT EXTRACTION Bilateral    CESAREAN SECTION     COLONOSCOPY     FRACTURE SURGERY Right    Ankle with two screws   MINIMALLY INVASIVE MAZE PROCEDURE N/A 03/28/2018   Procedure: MINIMALLY INVASIVE MAZE PROCEDURE;  Surgeon: Dusty Sudie DEL, MD;  Location: [facility] OR;  Service: Open Heart Surgery;  Laterality: N/A;   MITRAL VALVE REPLACEMENT Right 03/28/2018   Procedure: MINIMALLY INVASIVE MITRAL VALVE (MV)  REPLACEMENT. Magna Mitral Ease 31mm.;  Surgeon: Dusty Sudie DEL, MD;  Location: [facility] OR;  Service: Open Heart Surgery;  Laterality: Right;  glutaraldehyde   PERCUTANEOUS BALLOON VALVULOPLASTY  10/06/1998   [employer] [facility]   RIGHT/LEFT HEART CATH AND CORONARY ANGIOGRAPHY N/A 03/04/2018   Procedure: RIGHT/LEFT HEART CATH AND CORONARY ANGIOGRAPHY;  Surgeon: Dann Candyce RAMAN, MD;  Location: [facility] INVASIVE CV LAB;  Service: Cardiovascular;  Laterality: N/A;   screw in ankles  05/08/1987   right   TEE WITHOUT CARDIOVERSION N/A 12/31/2017   Procedure: TRANSESOPHAGEAL ECHOCARDIOGRAM (TEE);  Surgeon: Pietro Redell RAMAN, MD;  Location: [facility] ENDOSCOPY;  Service: Cardiovascular;  Laterality: N/A;   TEE WITHOUT CARDIOVERSION N/A 03/28/2018   Procedure: TRANSESOPHAGEAL ECHOCARDIOGRAM (TEE);  Surgeon: Dusty Sudie DEL, MD;  Location: [facility] OR;  Service: Open Heart Surgery;  Laterality: N/A;   THYROIDECTOMY  05/30/2012   Procedure: THYROIDECTOMY;  Surgeon: Krystal CHRISTELLA Spinner, MD;  Location: [facility] ORS;  Service: General;  Laterality: N/A;  total thyroidectomy   TONSILLECTOMY  05/07/1954   TUBAL LIGATION       ALLERGIES:  Allergies  Allergen Reactions   Metoprolol  Succinate [Metoprolol ] Other (See Comments)     Fatigue , and sleep disturbances  CURRENT MEDICATIONS:  Outpatient Encounter Medications as of 02/10/2024  Medication Sig   acetaminophen  (TYLENOL ) 500 MG tablet Take 500-1,000 mg by mouth every 6 (six) hours as needed (pain.).   amoxicillin (AMOXIL) 500 MG capsule Take 2,000 mg by mouth See admin instructions. Take 4 capsules (2000 mg) by mouth 1 hour prior to dental appointment   anastrozole  (ARIMIDEX ) 1 MG tablet TAKE 1 TABLET BY MOUTH EVERY DAY   atorvastatin  (LIPITOR) 20 MG tablet Take 1 tablet (20 mg total) by mouth daily.   Calcium  Carb-Cholecalciferol (CALCIUM  600+D3 PO) Take 1 tablet by mouth in the morning. (Patient not taking: Reported on 09/03/2023)   diltiazem  (CARDIZEM  CD) 120 MG 24 hr  capsule TAKE 1 CAPSULE BY MOUTH EVERY DAY   DULoxetine (CYMBALTA) 30 MG capsule Take 30 mg by mouth in the morning.   ELIQUIS  5 MG TABS tablet TAKE 1 TABLET BY MOUTH TWICE A DAY   levothyroxine  (SYNTHROID ) 88 MCG tablet Take 88 mcg by mouth daily.   Multiple Minerals-Vitamins (CALCIUM -MAGNESIUM -ZINC-D3 PO) Take 1 tablet by mouth daily.   Multiple Vitamin (MULTIVITAMIN WITH MINERALS) TABS tablet Take 1 tablet by mouth in the morning.   No facility-administered encounter medications on file as of 02/10/2024.     ONCOLOGIC FAMILY HISTORY:  Family History  Problem Relation Age of Onset   Heart disease Mother        S/P CABG   Lung cancer Father 10       smoked   Hypertension Sister    Lung cancer Sister 21       smoked   Brain cancer Brother 57   Heart disease Maternal Grandmother    Breast cancer Cousin 14       double first cousin     SOCIAL HISTORY:  Social History   Socioeconomic History   Marital status: Married    Spouse name: Not on file   Number of children: 2   Years of education: Not on file   Highest education level: Not on file  Occupational History   Occupation: branch office administrator    Employer: [employer]  Tobacco Use   Smoking status: Never   Smokeless tobacco: Never  Vaping Use   Vaping status: Never Used  Substance and Sexual Activity   Alcohol use: Yes    Alcohol/week: 1.0 standard drink of alcohol    Types: 1 Glasses of wine per week    Comment: occas glass of wine weekly   Drug use: No   Sexual activity: Yes    Partners: Male    Birth control/protection: Surgical, Post-menopausal    Comment: BTL  Other Topics Concern   Not on file  Social History Narrative   Not on file   Social Drivers of Health   Financial Resource Strain: Low Risk  (06/02/2023)   Received from [facility]   Overall Financial Resource Strain (CARDIA)    Difficulty of Paying Living Expenses: Not hard at all  Food Insecurity: No Food Insecurity (06/02/2023)    Received [facility]   Hunger Vital Sign    Within the past 12 months, you worried that your food would run out before you got the money to buy more.: Never true    Within the past 12 months, the food you bought just didn't last and you didn't have money to get more.: Never true  Transportation Needs: No Transportation Needs (06/02/2023)   Received from [facility] - Transportation    Lack  of Transportation (Medical): No    Lack of Transportation (Non-Medical): No  Physical Activity: Unknown (06/02/2023)   Received from [facility]   Exercise Vital Sign    On average, how many days per week do you engage in moderate to strenuous exercise (like a brisk walk)?: 0 days    Minutes of Exercise per Session: Not on file  Stress: No Stress Concern Present (06/02/2023)   Received from [facility] - Occupational Stress Questionnaire    Feeling of Stress : Only a little  Social Connections: Socially Integrated (06/02/2023)   Received [facility]   Social Network    How would you rate your social network (family, work, friends)?: Good participation with social networks  Intimate Partner Violence: Not At Risk (06/02/2023)   Received from [facility]   HITS    Over the last 12 months how often did your partner physically hurt you?: Never    Over the last 12 months how often did your partner insult you or talk down to you?: Never    Over the last 12 months how often did your partner threaten you with physical harm?: Never    Over the last 12 months how often did your partner scream or curse at you?: Never     OBSERVATIONS/OBJECTIVE:  BP (!) 149/72 (BP Location: Left Arm, Patient Position: Sitting)   Pulse 83   Temp 97.6 F (36.4 C) (Temporal)   Resp 17   Wt 178 lb 3.2 oz (80.8 kg)   LMP 05/07/2002 (Approximate)   SpO2 100%   BMI 29.65 kg/m  GENERAL: Patient is a well appearing female in no acute distress HEENT:   Sclerae anicteric.  Oropharynx clear and moist. No ulcerations or evidence of oropharyngeal candidiasis. Neck is supple.  NODES:  No cervical, supraclavicular, or axillary lymphadenopathy palpated.  BREAST EXAM: Right breast status postlumpectomy and radiation no sign of local recurrence left breast is benign. LUNGS:  Clear to auscultation bilaterally.  No wheezes or rhonchi. HEART:  Regular rate and rhythm. No murmur appreciated. ABDOMEN:  Soft, nontender.  Positive, normoactive bowel sounds. No organomegaly palpated. MSK:  No focal spinal tenderness to palpation. Full range of motion bilaterally in the upper extremities. EXTREMITIES:  No peripheral edema.   SKIN:  Clear with no obvious rashes or skin changes. No nail dyscrasia. NEURO:  Nonfocal. Well oriented.  Appropriate affect.   LABORATORY DATA:  None for this visit.  DIAGNOSTIC IMAGING:  None for this visit.      ASSESSMENT AND PLAN:   Ms.. Lorraine Kemp is a pleasant 73 y.o. female with Stage 0 right breast DCIS, ER+/PR+, diagnosed in 06/2022, treated with lumpectomy, adjuvant radiation therapy, and anti-estrogen therapy with Anastrozole  beginning in 11/2022.  Assessment and Plan Assessment & Plan    Total encounter time:30 minutes*in face-to-face visit time, chart review, lab review, care coordination, order entry, and documentation of the encounter time.  *Total Encounter Time as defined by the Centers for Medicare and Medicaid Services includes, in addition to the face-to-face time of a patient visit (documented in the note above) non-face-to-face time: obtaining and reviewing outside history, ordering and reviewing medications, tests or procedures, care coordination (communications with other health care professionals or caregivers) and documentation in the medical record.

## 2024-02-20 ENCOUNTER — Ambulatory Visit: Admitting: Hematology and Oncology

## 2024-02-21 ENCOUNTER — Encounter: Payer: Self-pay | Admitting: Cardiology

## 2024-02-24 ENCOUNTER — Other Ambulatory Visit: Payer: Self-pay | Admitting: Cardiovascular Disease

## 2024-02-24 ENCOUNTER — Encounter (HOSPITAL_COMMUNITY): Payer: Self-pay

## 2024-02-24 ENCOUNTER — Other Ambulatory Visit (HOSPITAL_COMMUNITY): Payer: Self-pay | Admitting: *Deleted

## 2024-02-24 ENCOUNTER — Telehealth (HOSPITAL_COMMUNITY): Payer: Self-pay | Admitting: *Deleted

## 2024-02-24 DIAGNOSIS — R079 Chest pain, unspecified: Secondary | ICD-10-CM

## 2024-02-24 NOTE — Telephone Encounter (Signed)
Attempted to call patient regarding upcoming cardiac PET appointment. Left message on voicemail with name and callback number  Larey Brick RN Navigator Cardiac Imaging [facility] [phone] Office [phone] Cell  Reminder to avoid caffeine 12 hours prior to her cardiac PET study.

## 2024-02-24 NOTE — Progress Notes (Signed)
 PET consent ordered.  Signed, Darryle DASEN. Barbaraann, MD, [facility]  [facility]  [address] [place], [place] [address] [phone]  9:36 AM

## 2024-02-24 NOTE — Telephone Encounter (Signed)
 Received call from patient regarding upcoming cardiac imaging study; pt verbalizes understanding of appt date/time, parking situation and where to check in, pre-test NPO status and medications ordered, and verified current allergies; name and call back number provided for further questions should they arise Lorraine Frame RN Navigator Cardiac Imaging [facility] [phone] office [phone] cell  Patient aware to avoid caffeine for 12 hours prior to test.

## 2024-02-25 ENCOUNTER — Ambulatory Visit: Payer: Self-pay | Admitting: Cardiology

## 2024-02-25 ENCOUNTER — Ambulatory Visit (HOSPITAL_COMMUNITY)
Admission: RE | Admit: 2024-02-25 | Discharge: 2024-02-25 | Disposition: A | Discharge status: Home / Self Care | Source: Ambulatory Visit | Attending: Cardiology | Admitting: Cardiology

## 2024-02-25 DIAGNOSIS — R072 Precordial pain: Secondary | ICD-10-CM | POA: Insufficient documentation

## 2024-02-25 DIAGNOSIS — R918 Other nonspecific abnormal finding of lung field: Secondary | ICD-10-CM

## 2024-02-25 LAB — NM PET CT CARDIAC PERFUSION MULTI W/ABSOLUTE BLOODFLOW
LV dias vol: 82 mL (ref 46–106)
LV sys vol: 32 mL (ref 3.8–5.2)
MBFR: 1.67
Nuc Rest EF: 61 %
Nuc Stress EF: 59 %
Peak HR: 67 {beats}/min
Rest HR: 62 {beats}/min
Rest MBF: 0.9 ml/g/min
Rest Nuclear Isotope Dose: 20.8 mCi
ST Depression (mm): 0 mm
Stress MBF: 1.5 ml/g/min
Stress Nuclear Isotope Dose: 21 mCi

## 2024-02-25 MED ORDER — RUBIDIUM RB82 GENERATOR (RUBYFILL)
21.0000 | PACK | Freq: Once | INTRAVENOUS | Status: AC
  Administered 2024-02-25: 21 via INTRAVENOUS

## 2024-02-25 MED ORDER — REGADENOSON 0.4 MG/5ML IV SOLN
0.4000 mg | Freq: Once | INTRAVENOUS | Status: AC
  Administered 2024-02-25: 0.4 mg via INTRAVENOUS

## 2024-02-25 MED ORDER — RUBIDIUM RB82 GENERATOR (RUBYFILL)
20.8000 | PACK | Freq: Once | INTRAVENOUS | Status: AC
  Administered 2024-02-25: 20.8 via INTRAVENOUS

## 2024-02-25 MED ORDER — REGADENOSON 0.4 MG/5ML IV SOLN
INTRAVENOUS | Status: AC
  Filled 2024-02-25: qty 5

## 2024-02-27 ENCOUNTER — Encounter: Payer: Self-pay | Admitting: Cardiology

## 2024-03-26 NOTE — Progress Notes (Signed)
 HPI: FU atrial fibrillation, MS and rheumatic heart disease s/p MVR. She has had prior balloon valvuloplasty at [facility] in June 2000. TEE August 2019 showed normal LV function, biatrial enlargement, mild right ventricular enlargement, severe mitral stenosis with mitral valve area of 1 cm, mild to moderate mitral regurgitation and mild tricuspid regurgitation. Cardiac catheterization October 2019 showed nonobstructive coronary disease. Carotid Dopplers November 2019 showed 1 to 39% bilateral stenosis.  Patient had minimally invasive mitral valve replacement with an Edwards magna mitral stented bovine pericardial tissue valve and minimally invasive maze procedure 11/19. Echocardiogram May 2025 showed normal LV function, severe biatrial enlargement, status post mitral valve replacement with no mitral regurgitation or mitral stenosis, mild to moderate tricuspid regurgitation, trace aortic insufficiency.  Stress PET October 2025 showed normal LV function, normal perfusion.  Since I last saw her, patient notes some dyspnea with activities at times.  No orthopnea, PND, pedal edema, chest pain or syncope.  Occasional palpitations.  Current Outpatient Medications  Medication Sig Dispense Refill   acetaminophen  (TYLENOL ) 500 MG tablet Take 500-1,000 mg by mouth every 6 (six) hours as needed (pain.).     amoxicillin (AMOXIL) 500 MG capsule Take 2,000 mg by mouth See admin instructions. Take 4 capsules (2000 mg) by mouth 1 hour prior to dental appointment  2   anastrozole  (ARIMIDEX ) 1 MG tablet TAKE 1 TABLET BY MOUTH EVERY DAY 90 tablet 3   diltiazem  (CARDIZEM  CD) 120 MG 24 hr capsule TAKE 1 CAPSULE BY MOUTH EVERY DAY 90 capsule 2   DULoxetine (CYMBALTA) 30 MG capsule Take 30 mg by mouth in the morning.     ELIQUIS  5 MG TABS tablet TAKE 1 TABLET BY MOUTH TWICE A DAY 60 tablet 5   levothyroxine  (SYNTHROID ) 88 MCG tablet Take 88 mcg by mouth daily.     Multiple Minerals-Vitamins  (CALCIUM -MAGNESIUM -ZINC-D3 PO) Take 1 tablet by mouth daily.     Multiple Vitamin (MULTIVITAMIN WITH MINERALS) TABS tablet Take 1 tablet by mouth in the morning.     No current facility-administered medications for this visit.     Past Medical History:  Diagnosis Date   Atrial fibrillation (HCC)    Cancer (HCC) 2024   Breast CA   Chronic diastolic congestive heart failure (HCC)    Complication of anesthesia    Depression    Dysrhythmia    A. Fib   Hematuria    workup by Dr. Matilda   Hypothyroidism    Thyroid  removed   Longstanding persistent atrial fibrillation (HCC)    MITRAL REGURGITATION    Mitral stenosis    Personal history of radiation therapy    Pneumonia    Hx   PONV (postoperative nausea and vomiting)    RHEUMATIC HEART DISEASE    S/P Minimally invasive maze operation for atrial fibrillation 03/28/2018   Complete bilateral atrial lesion set using crythermy and bipolar radiofrequency ablation with clipping of LA appendage via right mini thoracotomy   S/P minimally invasive mitral valve replacement with bioprosthetic valve 03/28/2018   31 mm [facility] Mitral stented bovine pericardial tissue valve    Thyroid  nodule    Wrist fracture, left 2019   Dr. Harden    Past Surgical History:  Procedure Laterality Date   ABDOMINAL AORTOGRAM N/A 03/04/2018   Procedure: ABDOMINAL AORTOGRAM;  Surgeon: Dann Candyce RAMAN, MD;  Location: [facility] INVASIVE CV LAB;  Service: Cardiovascular;  Laterality: N/A;   BREAST BIOPSY Right 07/18/2022   MM RT BREAST BX W  LOC DEV 1ST LESION IMAGE BX SPEC STEREO GUIDE 07/18/2022 [facility] MAMMOGRAPHY   BREAST BIOPSY  08/28/2022   MM RT RADIOACTIVE SEED LOC MAMMO GUIDE 08/28/2022 [facility]MO[facility]   BREAST LUMPECTOMY WITH RADIOACTIVE SEED LOCALIZATION Right 08/29/2022   Procedure: RIGHT BREAST LUMPECTOMY WITH RADIOACTIVE SEED LOCALIZATION;  Surgeon: Curvin Deward MOULD, MD;  Location: [facility] OR;  Service: General;  Laterality: Right;   CARDIOVERSION      2000   CATARACT EXTRACTION Bilateral    CESAREAN SECTION     COLONOSCOPY     FRACTURE SURGERY Right    Ankle with two screws   MINIMALLY INVASIVE MAZE PROCEDURE N/A 03/28/2018   Procedure: MINIMALLY INVASIVE MAZE PROCEDURE;  Surgeon: Dusty Sudie DEL, MD;  Location: [facility] OR;  Service: Open Heart Surgery;  Laterality: N/A;   MITRAL VALVE REPLACEMENT Right 03/28/2018   Procedure: MINIMALLY INVASIVE MITRAL VALVE (MV) REPLACEMENT. Magna Mitral Ease 31mm.;  Surgeon: Dusty Sudie DEL, MD;  Location: [facility] OR;  Service: Open Heart Surgery;  Laterality: Right;  glutaraldehyde   PERCUTANEOUS BALLOON VALVULOPLASTY  10/06/1998   [employer]   RIGHT/LEFT HEART CATH AND CORONARY ANGIOGRAPHY N/A 03/04/2018   Procedure: RIGHT/LEFT HEART CATH AND CORONARY ANGIOGRAPHY;  Surgeon: Dann Candyce RAMAN, MD;  Location: [facility] INVASIVE CV LAB;  Service: Cardiovascular;  Laterality: N/A;   screw in ankles  05/08/1987   right   TEE WITHOUT CARDIOVERSION N/A 12/31/2017   Procedure: TRANSESOPHAGEAL ECHOCARDIOGRAM (TEE);  Surgeon: Pietro Redell RAMAN, MD;  Location: [facility] ENDOSCOPY;  Service: Cardiovascular;  Laterality: N/A;   TEE WITHOUT CARDIOVERSION N/A 03/28/2018   Procedure: TRANSESOPHAGEAL ECHOCARDIOGRAM (TEE);  Surgeon: Dusty Sudie DEL, MD;  Location: [facility] OR;  Service: Open Heart Surgery;  Laterality: N/A;   THYROIDECTOMY  05/30/2012   Procedure: THYROIDECTOMY;  Surgeon: Krystal CHRISTELLA Spinner, MD;  Location: [facility] ORS;  Service: General;  Laterality: N/A;  total thyroidectomy   TONSILLECTOMY  05/07/1954   TUBAL LIGATION      Social History   Socioeconomic History   Marital status: Married    Spouse name: Not on file   Number of children: 2   Years of education: Not on file   Highest education level: Not on file  Occupational History   Occupation: branch office administrator    Employer: [employer]  Tobacco Use   Smoking status: Never   Smokeless tobacco: Never  Vaping Use   Vaping status: Never Used  Substance  and Sexual Activity   Alcohol use: Yes    Alcohol/week: 1.0 standard drink of alcohol    Types: 1 Glasses of wine per week    Comment: occas glass of wine weekly   Drug use: No   Sexual activity: Yes    Partners: Male    Birth control/protection: Surgical, Post-menopausal    Comment: BTL  Other Topics Concern   Not on file  Social History Narrative   Not on file   Social Drivers of Health   Financial Resource Strain: Low Risk  (06/02/2023)   Received from [facility]   Overall Financial Resource Strain (CARDIA)    Difficulty of Paying Living Expenses: Not hard at all  Food Insecurity: No Food Insecurity (06/02/2023)   Received from [facility]   Hunger Vital Sign    Within the past 12 months, you worried that your food would run out before you got the money to buy more.: Never true    Within the past 12 months, the food you bought just didn't last and you  didn't have money to get more.: Never true  Transportation Needs: No Transportation Needs (06/02/2023)   Received from [facility]   PRAPARE - Transportation    Lack of Transportation (Medical): No    Lack of Transportation (Non-Medical): No  Physical Activity: Unknown (06/02/2023)   Received from [facility]   Exercise Vital Sign    On average, how many days per week do you engage in moderate to strenuous exercise (like a brisk walk)?: 0 days    Minutes of Exercise per Session: Not on file  Stress: No Stress Concern Present (06/02/2023)   Received from [facility] - Occupational Stress Questionnaire    Feeling of Stress : Only a little  Social Connections: Socially Integrated (06/02/2023)   Received from [facility]   Social Network    How would you rate your social network (family, work, friends)?: Good participation with social networks  Intimate Partner Violence: Not At Risk (06/02/2023)   Received from [facility]   HITS    Over the last 12 months how often did  your partner physically hurt you?: Never    Over the last 12 months how often did your partner insult you or talk down to you?: Never    Over the last 12 months how often did your partner threaten you with physical harm?: Never    Over the last 12 months how often did your partner scream or curse at you?: Never    Family History  Problem Relation Age of Onset   Heart disease Mother        S/P CABG   Lung cancer Father 39       smoked   Hypertension Sister    Lung cancer Sister 24       smoked   Brain cancer Brother 4   Heart disease Maternal Grandmother    Breast cancer Cousin 27       double first cousin    ROS: no fevers or chills, productive cough, hemoptysis, dysphasia, odynophagia, melena, hematochezia, dysuria, hematuria, rash, seizure activity, orthopnea, PND, pedal edema, claudication. Remaining systems are negative.  Physical Exam: Well-developed well-nourished in no acute distress.  Skin is warm and dry.  HEENT is normal.  Neck is supple.  Chest is clear to auscultation with normal expansion.  Cardiovascular exam is irregular Abdominal exam nontender or distended. No masses palpated. Extremities show no edema. neuro grossly intact   A/P  1 paroxysmal atrial fibrillation-  Continue Cardizem  and apixaban .  2 history of mitral valve replacement-most recent echocardiogram showed normally functioning valve.  Continue SBE prophylaxis.  3 hyperlipidemia-continue statin.  4 history of mild coronary artery disease-continue statin.  5 recent jaw/shoulder pain-etiology unclear.  Stress PET showed no ischemia.  6 dyspnea on exertion/palpitations-patient feels less energetic at times.  She wonders if it is because of weight gain but is also concerned she is having more atrial fibrillation.  Notes she had been asymptomatic with atrial fibrillation in the past.  We will arrange 7-day Zio patch to assess symptoms in relation to her arrhythmia.  If she indeed is having  symptoms that we feel is secondary to atrial fibrillation we will consider antiarrhythmic therapy to help maintain sinus rhythm.  Redell Shallow, MD

## 2024-04-08 ENCOUNTER — Encounter: Payer: Self-pay | Admitting: Cardiology

## 2024-04-08 ENCOUNTER — Ambulatory Visit

## 2024-04-08 ENCOUNTER — Ambulatory Visit: Attending: Cardiology | Admitting: Cardiology

## 2024-04-08 VITALS — BP 122/88 | HR 78 | Ht 65.0 in | Wt 180.0 lb

## 2024-04-08 DIAGNOSIS — I480 Paroxysmal atrial fibrillation: Secondary | ICD-10-CM

## 2024-04-08 DIAGNOSIS — I25118 Atherosclerotic heart disease of native coronary artery with other forms of angina pectoris: Secondary | ICD-10-CM

## 2024-04-08 DIAGNOSIS — E785 Hyperlipidemia, unspecified: Secondary | ICD-10-CM | POA: Diagnosis not present

## 2024-04-08 DIAGNOSIS — Z8679 Personal history of other diseases of the circulatory system: Secondary | ICD-10-CM

## 2024-04-08 DIAGNOSIS — Z9889 Other specified postprocedural states: Secondary | ICD-10-CM

## 2024-04-08 NOTE — Progress Notes (Unsigned)
 Enrolled patient for a 7 day Zio XT monitor to be mailed to patients home.

## 2024-04-08 NOTE — Patient Instructions (Signed)
 Testing/Procedures:  GEOFFRY HEWS- Long Term Monitor Instructions  Your physician has requested you wear a ZIO patch monitor for 7 days.  This is a single patch monitor. [employer] supplies one patch monitor per enrollment. Additional stickers are not available. Please do not apply patch if you will be having a Nuclear Stress Test,  Echocardiogram, Cardiac CT, MRI, or Chest Xray during the period you would be wearing the  monitor. The patch cannot be worn during these tests. You cannot remove and re-apply the  ZIO XT patch monitor.  Your ZIO patch monitor will be mailed 3 day USPS to your address on file. It may take 3-5 days  to receive your monitor after you have been enrolled.  Once you have received your monitor, please review the enclosed instructions. Your monitor  has already been registered assigning a specific monitor serial # to you.  Billing and Patient Assistance Program Information  We have supplied [employer] with any of your insurance information on file for billing purposes. [employer] offers a sliding scale Patient Assistance Program for patients that do not have  insurance, or whose insurance does not completely cover the cost of the ZIO monitor.  You must apply for the Patient Assistance Program to qualify for this discounted rate.  To apply, please call [employer] at [phone], select option 4, select option 2, ask to apply for  Patient Assistance Program. Meredeth will ask your household income, and how many people  are in your household. They will quote your out-of-pocket cost based on that information.  [employer] will also be able to set up a 60-month, interest-free payment plan if needed.  Applying the monitor   Shave hair from upper left chest.  Hold abrader disc by orange tab. Rub abrader in 40 strokes over the upper left chest as  indicated in your monitor instructions.  Clean area with 4 enclosed alcohol pads. Let dry.  Apply patch as indicated in monitor instructions.  Patch will be placed under collarbone on left  side of chest with arrow pointing upward.  Rub patch adhesive wings for 2 minutes. Remove white label marked 1. Remove the white  label marked 2. Rub patch adhesive wings for 2 additional minutes.  While looking in a mirror, press and release button in center of patch. A small green light will  flash 3-4 times. This will be your only indicator that the monitor has been turned on.  Do not shower for the first 24 hours. You may shower after the first 24 hours.  Press the button if you feel a symptom. You will hear a small click. Record Date, Time and  Symptom in the Patient Logbook.  When you are ready to remove the patch, follow instructions on the last 2 pages of Patient  Logbook. Stick patch monitor onto the last page of Patient Logbook.  Place Patient Logbook in the blue and white box. Use locking tab on box and tape box closed  securely. The blue and white box has prepaid postage on it. Please place it in the mailbox as  soon as possible. Your physician should have your test results approximately 7 days after the  monitor has been mailed back to [facility].  Call [facility] Customer Care at [phone] if you have questions regarding  your ZIO XT patch monitor. Call them immediately if you see an orange light blinking on your  monitor.  If your monitor falls off in less than 4 days, contact our Monitor department at [phone].  If your monitor becomes loose or falls off after 4 days call [employer] at [phone] for  suggestions on securing your monitor   Follow-Up: At [facility], you and your health needs are our priority.  As part of our continuing mission to provide you with exceptional heart care, our providers are all part of one team.  This team includes your primary Cardiologist (physician) and Advanced Practice Providers or APPs (Physician Assistants and Nurse Practitioners) who all work together to provide  you with the care you need, when you need it.  Your next appointment:   6-8 week(s)  Provider:   Redell Shallow, MD

## 2024-04-21 ENCOUNTER — Other Ambulatory Visit: Payer: Self-pay | Admitting: Cardiology

## 2024-04-21 DIAGNOSIS — I4892 Unspecified atrial flutter: Secondary | ICD-10-CM

## 2024-04-21 NOTE — Telephone Encounter (Signed)
 Prescription refill request for Eliquis  received. Indication:afib Last office visit:12/25 Scr: 0.85  8/25 Age:73 Weight:81.6  kg  Prescription refilled

## 2024-04-28 ENCOUNTER — Ambulatory Visit: Payer: Self-pay | Admitting: Cardiology

## 2024-04-28 DIAGNOSIS — I480 Paroxysmal atrial fibrillation: Secondary | ICD-10-CM

## 2024-05-01 ENCOUNTER — Other Ambulatory Visit

## 2024-05-13 NOTE — Progress Notes (Signed)
 "    HPI: FU atrial fibrillation, MS and rheumatic heart disease s/p MVR. She has had prior balloon valvuloplasty at [facility] in June 2000. TEE August 2019 showed normal LV function, biatrial enlargement, mild right ventricular enlargement, severe mitral stenosis with mitral valve area of 1 cm, mild to moderate mitral regurgitation and mild tricuspid regurgitation. Cardiac catheterization October 2019 showed nonobstructive coronary disease. Carotid Dopplers November 2019 showed 1 to 39% bilateral stenosis.  Patient had minimally invasive mitral valve replacement with an Edwards magna mitral stented bovine pericardial tissue valve and minimally invasive maze procedure 11/19. Echocardiogram May 2025 showed normal LV function, severe biatrial enlargement, status post mitral valve replacement with no mitral regurgitation or mitral stenosis, mild to moderate tricuspid regurgitation, trace aortic insufficiency.  Stress PET October 2025 showed normal LV function, normal perfusion; 3 mm right lower lobe lung nodule with no follow-up recommended if patient low risk.  Monitor December 2025 showed sinus rhythm with occasional PACs, Mobitz 1 second-degree AV block in the early a.m. hours, occasional PVCs and rare triplet.  Since I last saw her, patient has dyspnea with more vigorous activities but not routine activities.  Still has occasional difficulties completing her exercise routine.  No orthopnea, PND, pedal edema, chest pain or syncope.  No bleeding.  Current Outpatient Medications  Medication Sig Dispense Refill   acetaminophen  (TYLENOL ) 500 MG tablet Take 500-1,000 mg by mouth every 6 (six) hours as needed (pain.).     amoxicillin (AMOXIL) 500 MG capsule Take 2,000 mg by mouth See admin instructions. Take 4 capsules (2000 mg) by mouth 1 hour prior to dental appointment  2   anastrozole  (ARIMIDEX ) 1 MG tablet TAKE 1 TABLET BY MOUTH EVERY DAY 90 tablet 3   diltiazem  (CARDIZEM  CD) 120 MG 24 hr capsule  TAKE 1 CAPSULE BY MOUTH EVERY DAY 90 capsule 2   DULoxetine (CYMBALTA) 30 MG capsule Take 30 mg by mouth in the morning.     ELIQUIS  5 MG TABS tablet TAKE 1 TABLET BY MOUTH TWICE A DAY 60 tablet 5   levothyroxine  (SYNTHROID ) 88 MCG tablet Take 88 mcg by mouth daily.     Multiple Minerals-Vitamins (CALCIUM -MAGNESIUM -ZINC-D3 PO) Take 1 tablet by mouth daily.     Multiple Vitamin (MULTIVITAMIN WITH MINERALS) TABS tablet Take 1 tablet by mouth in the morning.     No current facility-administered medications for this visit.     Past Medical History:  Diagnosis Date   Atrial fibrillation (HCC)    Cancer (HCC) 2024   Breast CA   Chronic diastolic congestive heart failure (HCC)    Complication of anesthesia    Depression    Dysrhythmia    A. Fib   Hematuria    workup by Dr. Matilda   Hypothyroidism    Thyroid  removed   Longstanding persistent atrial fibrillation (HCC)    MITRAL REGURGITATION    Mitral stenosis    Personal history of radiation therapy    Pneumonia    Hx   PONV (postoperative nausea and vomiting)    RHEUMATIC HEART DISEASE    S/P Minimally invasive maze operation for atrial fibrillation 03/28/2018   Complete bilateral atrial lesion set using crythermy and bipolar radiofrequency ablation with clipping of LA appendage via right mini thoracotomy   S/P minimally invasive mitral valve replacement with bioprosthetic valve 03/28/2018   31 mm [facility] Mitral stented bovine pericardial tissue valve    Thyroid  nodule    Wrist fracture, left 2019   Dr.  Harden    Past Surgical History:  Procedure Laterality Date   ABDOMINAL AORTOGRAM N/A 03/04/2018   Procedure: ABDOMINAL AORTOGRAM;  Surgeon: Dann Candyce RAMAN, MD;  Location: [facility] INVASIVE CV LAB;  Service: Cardiovascular;  Laterality: N/A;   BREAST BIOPSY Right 07/18/2022   MM RT BREAST BX W LOC DEV 1ST LESION IMAGE BX SPEC STEREO GUIDE 07/18/2022 [facility] MAMMOGRAPHY   BREAST BIOPSY  08/28/2022   MM RT RADIOACTIVE SEED  LOC MAMMO GUIDE 08/28/2022 [facility] MAMMOGRAPHY   BREAST LUMPECTOMY WITH RADIOACTIVE SEED LOCALIZATION Right 08/29/2022   Procedure: RIGHT BREAST LUMPECTOMY WITH RADIOACTIVE SEED LOCALIZATION;  Surgeon: Curvin Deward MOULD, MD;  Location: [facility] OR;  Service: General;  Laterality: Right;   CARDIOVERSION     2000   CATARACT EXTRACTION Bilateral    CESAREAN SECTION     COLONOSCOPY     FRACTURE SURGERY Right    Ankle with two screws   MINIMALLY INVASIVE MAZE PROCEDURE N/A 03/28/2018   Procedure: MINIMALLY INVASIVE MAZE PROCEDURE;  Surgeon: Dusty Sudie DEL, MD;  Location: [facility] OR;  Service: Open Heart Surgery;  Laterality: N/A;   MITRAL VALVE REPLACEMENT Right 03/28/2018   Procedure: MINIMALLY INVASIVE MITRAL VALVE (MV) REPLACEMENT. Magna Mitral Ease 31mm.;  Surgeon: Dusty Sudie DEL, MD;  Location: [facility] OR;  Service: Open Heart Surgery;  Laterality: Right;  glutaraldehyde   PERCUTANEOUS BALLOON VALVULOPLASTY  10/06/1998   [facility]   RIGHT/LEFT HEART CATH AND CORONARY ANGIOGRAPHY N/A 03/04/2018   Procedure: RIGHT/LEFT HEART CATH AND CORONARY ANGIOGRAPHY;  Surgeon: Dann Candyce RAMAN, MD;  Location: [facility] INVASIVE CV LAB;  Service: Cardiovascular;  Laterality: N/A;   screw in ankles  05/08/1987   right   TEE WITHOUT CARDIOVERSION N/A 12/31/2017   Procedure: TRANSESOPHAGEAL ECHOCARDIOGRAM (TEE);  Surgeon: Pietro Redell RAMAN, MD;  Location: [facility] ENDOSCOPY;  Service: Cardiovascular;  Laterality: N/A;   TEE WITHOUT CARDIOVERSION N/A 03/28/2018   Procedure: TRANSESOPHAGEAL ECHOCARDIOGRAM (TEE);  Surgeon: Dusty Sudie DEL, MD;  Location: [facility] OR;  Service: Open Heart Surgery;  Laterality: N/A;   THYROIDECTOMY  05/30/2012   Procedure: THYROIDECTOMY;  Surgeon: Krystal CHRISTELLA Spinner, MD;  Location: [facility] ORS;  Service: General;  Laterality: N/A;  total thyroidectomy   TONSILLECTOMY  05/07/1954   TUBAL LIGATION      Social History   Socioeconomic History   Marital status: Married    Spouse name: Not on file   Number of  children: 2   Years of education: Not on file   Highest education level: Not on file  Occupational History   Occupation: branch office administrator    Employer: [employer]  Tobacco Use   Smoking status: Never   Smokeless tobacco: Never  Vaping Use   Vaping status: Never Used  Substance and Sexual Activity   Alcohol use: Yes    Alcohol/week: 1.0 standard drink of alcohol    Types: 1 Glasses of wine per week    Comment: occas glass of wine weekly   Drug use: No   Sexual activity: Yes    Partners: Male    Birth control/protection: Surgical, Post-menopausal    Comment: BTL  Other Topics Concern   Not on file  Social History Narrative   Not on file   Social Drivers of Health   Tobacco Use: Low Risk (04/08/2024)   Patient History    Smoking Tobacco Use: Never    Smokeless Tobacco Use: Never    Passive Exposure: Not on file  Financial Resource Strain: Low Risk (06/02/2023)  Received from [employer]   Overall Financial Resource Strain (CARDIA)    Difficulty of Paying Living Expenses: Not hard at all  Food Insecurity: No Food Insecurity (06/02/2023)   Received from [facility]   Epic    Within the past 12 months, you worried that your food would run out before you got the money to buy more.: Never true    Within the past 12 months, the food you bought just didn't last and you didn't have money to get more.: Never true  Transportation Needs: No Transportation Needs (06/02/2023)   Received from [facility] - Transportation    Lack of Transportation (Medical): No    Lack of Transportation (Non-Medical): No  Physical Activity: Unknown (06/02/2023)   Received from [facility]   Exercise Vital Sign    On average, how many days per week do you engage in moderate to strenuous exercise (like a brisk walk)?: 0 days    Minutes of Exercise per Session: Not on file  Stress: No Stress Concern Present (06/02/2023)   Received from [facility]  [facility]up[facility] - Occupational Stress Questionnaire    Feeling of Stress : Only a little  Social Connections: Socially Integrated (06/02/2023)   Received from [facility]   Social Network    How would you rate your social network (family, work, friends)?: Good participation with social networks  Intimate Partner Violence: Not At Risk (06/02/2023)   Received from [facility]   HITS    Over the last 12 months how often did your partner physically hurt you?: Never    Over the last 12 months how often did your partner insult you or talk down to you?: Never    Over the last 12 months how often did your partner threaten you with physical harm?: Never    Over the last 12 months how often did your partner scream or curse at you?: Never  Depression (PHQ2-9): Not on file  Alcohol Screen: Not on file  Housing: Low Risk (06/02/2023)   Received from [facility]    In the last 12 months, was there a time when you were not able to pay the mortgage or rent on time?: No    In the past 12 months, how many times have you moved where you were living?: 0    At any time in the past 12 months, were you homeless or living in a shelter (including now)?: No  Utilities: Not At Risk (06/02/2023)   Received from [facility] Utilities    Threatened with loss of utilities: No  Health Literacy: Not on file    Family History  Problem Relation Age of Onset   Heart disease Mother        S/P CABG   Lung cancer Father 73       smoked   Hypertension Sister    Lung cancer Sister 67       smoked   Brain cancer Brother 46   Heart disease Maternal Grandmother    Breast cancer Cousin 43       double first cousin    ROS: no fevers or chills, productive cough, hemoptysis, dysphasia, odynophagia, melena, hematochezia, dysuria, hematuria, rash, seizure activity, orthopnea, PND, pedal edema, claudication. Remaining systems are negative.  Physical Exam: Well-developed well-nourished in no acute  distress.  Skin is warm and dry.  HEENT is normal.  Neck is supple.  Chest  is clear to auscultation with normal expansion.  Cardiovascular exam is regular rate and rhythm.  Abdominal exam nontender or distended. No masses palpated. Extremities show no edema. neuro grossly intact   A/P  1 paroxysmal atrial fibrillation-plan to continue Cardizem  for rate control if atrial fibrillation recurs.  Continue apixaban .  2 status post mitral valve replacement-normal function of prosthetic valve on most recent echocardiogram.  Continue SBE prophylaxis.  3 hyperlipidemia-continue statin.  4 mild coronary artery disease-continue statin.  Recent stress PET showed no ischemia.  5 dyspnea on exertion/palpitations-recent monitor showed no recurrent atrial fibrillation.  Etiology of dyspnea unclear.  Most recent echocardiogram showed normal LV function and normally functioning valve.  Previous stress PET showed no ischemia.  Will plan observation for now.  6 lung nodule-Will plan follow-up noncontrast CT October 2026.  Redell Shallow, MD    "

## 2024-05-19 ENCOUNTER — Ambulatory Visit: Attending: Cardiology | Admitting: Cardiology

## 2024-05-19 ENCOUNTER — Encounter: Payer: Self-pay | Admitting: Cardiology

## 2024-05-19 VITALS — BP 110/70 | HR 92 | Ht 65.0 in | Wt 181.0 lb

## 2024-05-19 DIAGNOSIS — E785 Hyperlipidemia, unspecified: Secondary | ICD-10-CM

## 2024-05-19 DIAGNOSIS — I25118 Atherosclerotic heart disease of native coronary artery with other forms of angina pectoris: Secondary | ICD-10-CM | POA: Diagnosis not present

## 2024-05-19 DIAGNOSIS — Z953 Presence of xenogenic heart valve: Secondary | ICD-10-CM

## 2024-05-19 DIAGNOSIS — R0609 Other forms of dyspnea: Secondary | ICD-10-CM

## 2024-05-19 DIAGNOSIS — I480 Paroxysmal atrial fibrillation: Secondary | ICD-10-CM | POA: Diagnosis not present

## 2024-05-19 NOTE — Patient Instructions (Addendum)
 Medication Instructions:  Continue same medications *If you need a refill on your cardiac medications before your next appointment, please call your pharmacy*  Lab Work: None ordered   Testing/Procedures: None ordered  Follow-Up: At [facility], you and your health needs are our priority.  As part of our continuing mission to provide you with exceptional heart care, our providers are all part of one team.  This team includes your primary Cardiologist (physician) and Advanced Practice Providers or APPs (Physician Assistants and Nurse Practitioners) who all work together to provide you with the care you need, when you need it.  Your next appointment:  6 months   Call in April to schedule July appointment     Provider:  Unice    We recommend signing up for the patient portal called MyChart.  Sign up information is provided on this After Visit Summary.  MyChart is used to connect with patients for Virtual Visits (Telemedicine).  Patients are able to view lab/test results, encounter notes, upcoming appointments, etc.  Non-urgent messages can be sent to your provider as well.   To learn more about what you can do with MyChart, go to [email or web address].

## 2024-06-03 LAB — LAB REPORT - SCANNED

## 2024-06-07 ENCOUNTER — Ambulatory Visit: Payer: Self-pay | Admitting: Cardiology

## 2024-06-07 DIAGNOSIS — E785 Hyperlipidemia, unspecified: Secondary | ICD-10-CM

## 2024-06-07 DIAGNOSIS — I25118 Atherosclerotic heart disease of native coronary artery with other forms of angina pectoris: Secondary | ICD-10-CM

## 2024-06-08 MED ORDER — ROSUVASTATIN CALCIUM 20 MG PO TABS: 20.0000 mg | ORAL_TABLET | Freq: Every day | ORAL | 3 refills | Status: DC

## 2024-06-08 MED ORDER — EZETIMIBE 10 MG PO TABS: 10.0000 mg | ORAL_TABLET | Freq: Every day | ORAL | 3 refills | Status: AC

## 2024-06-08 NOTE — Telephone Encounter (Signed)
 I had already sent in the rosuvastatin , so make sure you do not get it from the pharmacy. I have sent the Ezetimibe  in as well. Take care.

## 2024-07-06 ENCOUNTER — Encounter

## 2024-07-07 ENCOUNTER — Other Ambulatory Visit (HOSPITAL_BASED_OUTPATIENT_CLINIC_OR_DEPARTMENT_OTHER)

## 2024-08-11 ENCOUNTER — Ambulatory Visit: Admitting: Hematology and Oncology
